# Patient Record
Sex: Female | Born: 1950 | Race: White | Hispanic: No | Marital: Married | Smoking: Never smoker
Health system: Southern US, Community
[De-identification: ages and names within clinical notes are randomized; demographics above are authoritative.]

## PROBLEM LIST (undated history)

## (undated) DIAGNOSIS — Z8719 Personal history of other diseases of the digestive system: Secondary | ICD-10-CM

## (undated) DIAGNOSIS — K219 Gastro-esophageal reflux disease without esophagitis: Secondary | ICD-10-CM

## (undated) DIAGNOSIS — C801 Malignant (primary) neoplasm, unspecified: Secondary | ICD-10-CM

## (undated) DIAGNOSIS — M1990 Unspecified osteoarthritis, unspecified site: Secondary | ICD-10-CM

## (undated) DIAGNOSIS — R42 Dizziness and giddiness: Secondary | ICD-10-CM

## (undated) DIAGNOSIS — L509 Urticaria, unspecified: Secondary | ICD-10-CM

## (undated) HISTORY — DX: Urticaria, unspecified: L50.9

## (undated) HISTORY — PX: OTHER SURGICAL HISTORY: SHX169

## (undated) HISTORY — PX: CHOLECYSTECTOMY: SHX55

## (undated) HISTORY — PX: HERNIA REPAIR: SHX51

## (undated) HISTORY — PX: TONSILLECTOMY: SUR1361

---

## 2015-09-28 ENCOUNTER — Other Ambulatory Visit: Payer: Self-pay | Admitting: Orthopedic Surgery

## 2015-10-11 ENCOUNTER — Encounter (HOSPITAL_COMMUNITY)
Admission: RE | Admit: 2015-10-11 | Discharge: 2015-10-11 | Disposition: A | Discharge status: Home / Self Care | Payer: Medicare Other | Source: Ambulatory Visit | Attending: Orthopedic Surgery | Admitting: Orthopedic Surgery

## 2015-10-11 ENCOUNTER — Ambulatory Visit (HOSPITAL_COMMUNITY)
Admission: RE | Admit: 2015-10-11 | Discharge: 2015-10-11 | Disposition: A | Discharge status: Home / Self Care | Payer: Medicare Other | Source: Ambulatory Visit | Attending: Orthopedic Surgery | Admitting: Orthopedic Surgery

## 2015-10-11 ENCOUNTER — Encounter (HOSPITAL_COMMUNITY): Payer: Self-pay

## 2015-10-11 DIAGNOSIS — Z01812 Encounter for preprocedural laboratory examination: Secondary | ICD-10-CM | POA: Insufficient documentation

## 2015-10-11 DIAGNOSIS — R9431 Abnormal electrocardiogram [ECG] [EKG]: Secondary | ICD-10-CM | POA: Insufficient documentation

## 2015-10-11 DIAGNOSIS — Z01810 Encounter for preprocedural cardiovascular examination: Secondary | ICD-10-CM

## 2015-10-11 DIAGNOSIS — Z01818 Encounter for other preprocedural examination: Secondary | ICD-10-CM

## 2015-10-11 HISTORY — DX: Unspecified osteoarthritis, unspecified site: M19.90

## 2015-10-11 HISTORY — DX: Malignant (primary) neoplasm, unspecified: C80.1

## 2015-10-11 HISTORY — DX: Gastro-esophageal reflux disease without esophagitis: K21.9

## 2015-10-11 HISTORY — DX: Dizziness and giddiness: R42

## 2015-10-11 HISTORY — DX: Personal history of other diseases of the digestive system: Z87.19

## 2015-10-11 LAB — URINALYSIS, ROUTINE W REFLEX MICROSCOPIC
BILIRUBIN URINE: NEGATIVE
GLUCOSE, UA: NEGATIVE mg/dL
Hgb urine dipstick: NEGATIVE
KETONES UR: NEGATIVE mg/dL
NITRITE: NEGATIVE
PH: 5.5 (ref 5.0–8.0)
Protein, ur: NEGATIVE mg/dL
Specific Gravity, Urine: 1.021 (ref 1.005–1.030)

## 2015-10-11 LAB — COMPREHENSIVE METABOLIC PANEL
ALT: 27 U/L (ref 14–54)
AST: 24 U/L (ref 15–41)
Albumin: 4.2 g/dL (ref 3.5–5.0)
Alkaline Phosphatase: 75 U/L (ref 38–126)
Anion gap: 8 (ref 5–15)
BILIRUBIN TOTAL: 0.8 mg/dL (ref 0.3–1.2)
BUN: 15 mg/dL (ref 6–20)
CO2: 26 mmol/L (ref 22–32)
Calcium: 10 mg/dL (ref 8.9–10.3)
Chloride: 107 mmol/L (ref 101–111)
Creatinine, Ser: 0.73 mg/dL (ref 0.44–1.00)
Glucose, Bld: 99 mg/dL (ref 65–99)
POTASSIUM: 3.5 mmol/L (ref 3.5–5.1)
Sodium: 141 mmol/L (ref 135–145)
TOTAL PROTEIN: 7.1 g/dL (ref 6.5–8.1)

## 2015-10-11 LAB — URINE MICROSCOPIC-ADD ON

## 2015-10-11 LAB — CBC WITH DIFFERENTIAL/PLATELET
BASOS PCT: 1 %
Basophils Absolute: 0.1 10*3/uL (ref 0.0–0.1)
EOS PCT: 7 %
Eosinophils Absolute: 0.9 10*3/uL — ABNORMAL HIGH (ref 0.0–0.7)
HEMATOCRIT: 43.3 % (ref 36.0–46.0)
Hemoglobin: 14.1 g/dL (ref 12.0–15.0)
LYMPHS ABS: 2.9 10*3/uL (ref 0.7–4.0)
Lymphocytes Relative: 23 %
MCH: 28 pg (ref 26.0–34.0)
MCHC: 32.6 g/dL (ref 30.0–36.0)
MCV: 86.1 fL (ref 78.0–100.0)
MONO ABS: 3.3 10*3/uL — AB (ref 0.1–1.0)
MONOS PCT: 26 %
NEUTROS ABS: 5.3 10*3/uL (ref 1.7–7.7)
Neutrophils Relative %: 43 %
Platelets: 256 10*3/uL (ref 150–400)
RBC: 5.03 MIL/uL (ref 3.87–5.11)
RDW: 14.7 % (ref 11.5–15.5)
WBC: 12.5 10*3/uL — ABNORMAL HIGH (ref 4.0–10.5)

## 2015-10-11 LAB — PROTIME-INR
INR: 1.07 (ref 0.00–1.49)
PROTHROMBIN TIME: 14.1 s (ref 11.6–15.2)

## 2015-10-11 LAB — SURGICAL PCR SCREEN
MRSA, PCR: NEGATIVE
Staphylococcus aureus: NEGATIVE

## 2015-10-11 LAB — APTT: aPTT: 25 seconds (ref 24–37)

## 2015-10-11 NOTE — Pre-Procedure Instructions (Signed)
    Laurabel Lamoureux  10/11/2015     No Pharmacies Listed   Your procedure is scheduled on Thursday, Dec. 15th   Report to [facility] Admitting at 5:30 AM   Call this number if you have problems the morning of surgery:  [phone]   Remember:  Do not eat food or drink liquids after midnight Wednesday.   Take these medicines the morning of surgery with A SIP OF WATER :   Do not wear jewelry, make-up or nail polish.  Do not wear lotions, powders, or perfumes.  You may NOT wear deodorant the day of surgery.  Do not shave underarms & legs 48 hours prior to surgery.     Do not bring valuables to the hospital.  [facility] is not responsible for any belongings or valuables.  Contacts, dentures or bridgework may not be worn into surgery.  Leave your suitcase in the car.  After surgery it may be brought to your room. For patients admitted to the hospital, discharge time will be determined by your treatment team.  Name and phone number of your driver:     Please read over the following fact sheets that you were given. Pain Booklet, Coughing and Deep Breathing, MRSA Information and Surgical Site Infection Prevention

## 2015-10-11 NOTE — H&P (Signed)
     PREOPERATIVE H&P  Chief Complaint: R arm pain  HPI: Kathleen Duran is a 64 y.o. 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 No past surgical history on file. Social History   Social History  . Marital Status: Married    Spouse Name: N/A  . Number of Children: N/A  . Years of Education: N/A   Social History Main Topics  . Smoking status: Not on file  . Smokeless tobacco: Not on file  . Alcohol Use: Not on file  . Drug Use: Not on file  . Sexual Activity: Not on file   Other Topics Concern  . Not on file   Social History Narrative  . No narrative on file   No family history on file. Allergies not on file Prior to Admission medications   Not on File     All other systems have been reviewed and were otherwise negative with the exception of those mentioned in the HPI and as above.  Physical Exam: There were no vitals filed for this visit.  General: Alert, no acute distress Cardiovascular: No pedal edema Respiratory: No cyanosis, no use of accessory musculature Skin: No lesions in the area of chief complaint Neurologic: Sensation intact distally Psychiatric: Patient is competent for consent with normal mood and affect Lymphatic: No axillary or cervical lymphadenopathy  MUSCULOSKELETAL: + spurling's on the right  Assessment/Plan: Radiculopathy Plan for Procedure(s): ANTERIOR CERVICAL DECOMPRESSION/DISCECTOMY FUSION 1 LEVEL (C4/5)   Sinclair Ship, MD 10/11/2015 8:42 AM

## 2015-10-11 NOTE — Progress Notes (Addendum)
Patient currently lives in [place], [place], works in [place].  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 Denies any heart issues, and has never seen a cardio. She see Dr. Montine Circle (who is nephrologist) as her PCP, who is also in [place]. [phone] The office has no old EKG for comparison.  Dr. Randa Spike (hematology & oncology) [phone] (@ [facility])  Have called Dr. Laurena Bering office regarding her Penicillin allergies - spoke with [facility]

## 2015-10-12 NOTE — Progress Notes (Signed)
Anesthesia Chart Review:  Pt is 64 year old female scheduled for C4-5 ACDF on 10/14/2015 with Dr. Lynann Bologna.   PMH includes:  Dysrhythmia, leukemia (1999), non-Hodgkin's lymphoma (2002), GERD. Never smoker. BMI 38.   Medications include: ASA, lipitor, irbesartan, metoprolol, Rogaine, prilosec-bicarb.   Preoperative labs reviewed.    Chest x-ray 10/11/15 reviewed. No active cardiopulmonary disease  EKG 10/11/15: NSR. LAD. Possible anterior infarction, age undetermined.  No old tracing for comparison.   Reviewed case with Dr. Tobias Alexander.   If no changes, I anticipate pt can proceed with surgery as scheduled.   Kathleen Depas, FNP-BC [facility]/Anesthesiology Phone: [phone] 10/12/2015 1:42 PM

## 2015-10-14 ENCOUNTER — Inpatient Hospital Stay (HOSPITAL_COMMUNITY)
Admission: RE | Admit: 2015-10-14 | Discharge: 2015-10-15 | DRG: 473 | Disposition: A | Discharge status: Home / Self Care | Payer: Medicare Other | Source: Ambulatory Visit | Attending: Orthopedic Surgery | Admitting: Orthopedic Surgery

## 2015-10-14 ENCOUNTER — Inpatient Hospital Stay (HOSPITAL_COMMUNITY): Payer: Medicare Other | Admitting: Emergency Medicine

## 2015-10-14 ENCOUNTER — Inpatient Hospital Stay (HOSPITAL_COMMUNITY): Payer: Medicare Other | Admitting: Anesthesiology

## 2015-10-14 ENCOUNTER — Inpatient Hospital Stay (HOSPITAL_COMMUNITY): Payer: Medicare Other

## 2015-10-14 ENCOUNTER — Encounter (HOSPITAL_COMMUNITY)
Admission: RE | Disposition: A | Discharge status: Home / Self Care | Payer: Self-pay | Source: Ambulatory Visit | Attending: Orthopedic Surgery

## 2015-10-14 ENCOUNTER — Encounter (HOSPITAL_COMMUNITY): Payer: Self-pay | Admitting: Surgery

## 2015-10-14 DIAGNOSIS — Z7982 Long term (current) use of aspirin: Secondary | ICD-10-CM | POA: Diagnosis not present

## 2015-10-14 DIAGNOSIS — Z8572 Personal history of non-Hodgkin lymphomas: Secondary | ICD-10-CM | POA: Diagnosis not present

## 2015-10-14 DIAGNOSIS — M5412 Radiculopathy, cervical region: Secondary | ICD-10-CM | POA: Diagnosis present

## 2015-10-14 DIAGNOSIS — M50121 Cervical disc disorder at C4-C5 level with radiculopathy: Principal | ICD-10-CM | POA: Diagnosis present

## 2015-10-14 DIAGNOSIS — Z419 Encounter for procedure for purposes other than remedying health state, unspecified: Secondary | ICD-10-CM

## 2015-10-14 DIAGNOSIS — M5410 Radiculopathy, site unspecified: Secondary | ICD-10-CM | POA: Diagnosis present

## 2015-10-14 HISTORY — PX: ANTERIOR CERVICAL DECOMP/DISCECTOMY FUSION: SHX1161

## 2015-10-14 SURGERY — ANTERIOR CERVICAL DECOMPRESSION/DISCECTOMY FUSION 1 LEVEL
Anesthesia: General

## 2015-10-14 MED ORDER — FENTANYL CITRATE (PF) 250 MCG/5ML IJ SOLN
INTRAMUSCULAR | Status: AC
  Filled 2015-10-14: qty 5

## 2015-10-14 MED ORDER — ROCURONIUM BROMIDE 100 MG/10ML IV SOLN
INTRAVENOUS | Status: DC | PRN
  Administered 2015-10-14: 50 mg via INTRAVENOUS

## 2015-10-14 MED ORDER — FLEET ENEMA 7-19 GM/118ML RE ENEM
1.0000 | ENEMA | Freq: Once | RECTAL | Status: DC | PRN
Start: 2015-10-14 — End: 2015-10-15

## 2015-10-14 MED ORDER — THROMBIN 20000 UNITS EX KIT
PACK | CUTANEOUS | Status: AC
  Filled 2015-10-14: qty 1

## 2015-10-14 MED ORDER — POVIDONE-IODINE 7.5 % EX SOLN
Freq: Once | CUTANEOUS | Status: DC
  Filled 2015-10-14: qty 118

## 2015-10-14 MED ORDER — ATORVASTATIN CALCIUM 20 MG PO TABS
40.0000 mg | ORAL_TABLET | Freq: Every day | ORAL | Status: DC
  Administered 2015-10-14: 40 mg via ORAL
  Filled 2015-10-14: qty 2

## 2015-10-14 MED ORDER — LIDOCAINE HCL 1 % IJ SOLN
INTRAMUSCULAR | Status: DC | PRN
  Administered 2015-10-14: 80 mg via INTRADERMAL

## 2015-10-14 MED ORDER — HYDROMORPHONE HCL 2 MG PO TABS
1.0000 mg | ORAL_TABLET | ORAL | Status: DC | PRN
  Administered 2015-10-14 – 2015-10-15 (×5): 2 mg via ORAL
  Filled 2015-10-14 (×4): qty 1

## 2015-10-14 MED ORDER — SODIUM CHLORIDE 0.9 % IV SOLN: 250.0000 mL | INTRAVENOUS | Status: DC

## 2015-10-14 MED ORDER — ARTIFICIAL TEARS OP OINT
TOPICAL_OINTMENT | OPHTHALMIC | Status: AC
  Filled 2015-10-14: qty 7

## 2015-10-14 MED ORDER — GLYCOPYRROLATE 0.2 MG/ML IJ SOLN
INTRAMUSCULAR | Status: AC
  Filled 2015-10-14: qty 6

## 2015-10-14 MED ORDER — ACETAMINOPHEN 650 MG RE SUPP: 650.0000 mg | RECTAL | Status: DC | PRN

## 2015-10-14 MED ORDER — THROMBIN 20000 UNITS EX KIT
PACK | CUTANEOUS | Status: DC | PRN
  Administered 2015-10-14: 20000 [IU] via TOPICAL

## 2015-10-14 MED ORDER — DIPHENHYDRAMINE HCL 50 MG/ML IJ SOLN
INTRAMUSCULAR | Status: DC | PRN
  Administered 2015-10-14: 12.5 mg via INTRAVENOUS

## 2015-10-14 MED ORDER — ACETAMINOPHEN 325 MG PO TABS
650.0000 mg | ORAL_TABLET | ORAL | Status: DC | PRN
  Administered 2015-10-15: 650 mg via ORAL
  Filled 2015-10-14: qty 2

## 2015-10-14 MED ORDER — BUPIVACAINE-EPINEPHRINE (PF) 0.25% -1:200000 IJ SOLN
INTRAMUSCULAR | Status: AC
  Filled 2015-10-14: qty 30

## 2015-10-14 MED ORDER — VANCOMYCIN HCL IN DEXTROSE 1-5 GM/200ML-% IV SOLN
1000.0000 mg | Freq: Once | INTRAVENOUS | Status: AC
  Administered 2015-10-14: 1000 mg via INTRAVENOUS
  Filled 2015-10-14: qty 200

## 2015-10-14 MED ORDER — ROCURONIUM BROMIDE 50 MG/5ML IV SOLN
INTRAVENOUS | Status: AC
  Filled 2015-10-14: qty 4

## 2015-10-14 MED ORDER — ONDANSETRON HCL 4 MG/2ML IJ SOLN: 4.0000 mg | INTRAMUSCULAR | Status: DC | PRN

## 2015-10-14 MED ORDER — 0.9 % SODIUM CHLORIDE (POUR BTL) OPTIME
TOPICAL | Status: DC | PRN
  Administered 2015-10-14: 1000 mL

## 2015-10-14 MED ORDER — ADULT MULTIVITAMIN W/MINERALS CH
1.0000 | ORAL_TABLET | Freq: Every day | ORAL | Status: DC
  Filled 2015-10-14: qty 1

## 2015-10-14 MED ORDER — VITAMIN C 500 MG PO TABS
1000.0000 mg | ORAL_TABLET | Freq: Every day | ORAL | Status: DC
  Filled 2015-10-14 (×2): qty 2

## 2015-10-14 MED ORDER — VANCOMYCIN HCL 1000 MG IV SOLR
1000.0000 mg | INTRAVENOUS | Status: DC | PRN
  Administered 2015-10-14: 1000 mg via INTRAVENOUS

## 2015-10-14 MED ORDER — DIAZEPAM 5 MG PO TABS
ORAL_TABLET | ORAL | Status: AC
  Administered 2015-10-14: 5 mg via ORAL
  Filled 2015-10-14: qty 1

## 2015-10-14 MED ORDER — MIDAZOLAM HCL 5 MG/5ML IJ SOLN
INTRAMUSCULAR | Status: DC | PRN
  Administered 2015-10-14: 2 mg via INTRAVENOUS

## 2015-10-14 MED ORDER — DIAZEPAM 5 MG PO TABS
5.0000 mg | ORAL_TABLET | Freq: Four times a day (QID) | ORAL | Status: DC | PRN
  Administered 2015-10-14 – 2015-10-15 (×3): 5 mg via ORAL
  Filled 2015-10-14 (×2): qty 1

## 2015-10-14 MED ORDER — VITAMIN E 180 MG (400 UNIT) PO CAPS
400.0000 [IU] | ORAL_CAPSULE | Freq: Every day | ORAL | Status: DC
  Filled 2015-10-14 (×2): qty 1

## 2015-10-14 MED ORDER — SUGAMMADEX SODIUM 200 MG/2ML IV SOLN
INTRAVENOUS | Status: DC | PRN
  Administered 2015-10-14: 200 mg via INTRAVENOUS

## 2015-10-14 MED ORDER — MENTHOL 3 MG MT LOZG: 1.0000 | LOZENGE | OROMUCOSAL | Status: DC | PRN

## 2015-10-14 MED ORDER — BUPIVACAINE-EPINEPHRINE 0.25% -1:200000 IJ SOLN
INTRAMUSCULAR | Status: DC | PRN
  Administered 2015-10-14: 10 mL

## 2015-10-14 MED ORDER — DOCUSATE SODIUM 100 MG PO CAPS
100.0000 mg | ORAL_CAPSULE | Freq: Two times a day (BID) | ORAL | Status: DC
  Administered 2015-10-14: 100 mg via ORAL
  Filled 2015-10-14 (×2): qty 1

## 2015-10-14 MED ORDER — FENTANYL CITRATE (PF) 250 MCG/5ML IJ SOLN
INTRAMUSCULAR | Status: DC | PRN
  Administered 2015-10-14 (×5): 50 ug via INTRAVENOUS

## 2015-10-14 MED ORDER — SODIUM CHLORIDE 0.9 % IJ SOLN
3.0000 mL | Freq: Two times a day (BID) | INTRAMUSCULAR | Status: DC
  Administered 2015-10-14: 3 mL via INTRAVENOUS

## 2015-10-14 MED ORDER — STERILE WATER FOR INJECTION IJ SOLN
INTRAMUSCULAR | Status: AC
  Filled 2015-10-14: qty 10

## 2015-10-14 MED ORDER — LIDOCAINE HCL (CARDIAC) 20 MG/ML IV SOLN
INTRAVENOUS | Status: AC
  Filled 2015-10-14: qty 15

## 2015-10-14 MED ORDER — PHENOL 1.4 % MT LIQD: 1.0000 | OROMUCOSAL | Status: DC | PRN

## 2015-10-14 MED ORDER — SODIUM CHLORIDE 0.9 % IJ SOLN: 3.0000 mL | INTRAMUSCULAR | Status: DC | PRN

## 2015-10-14 MED ORDER — HYDROMORPHONE HCL 1 MG/ML IJ SOLN
0.2500 mg | INTRAMUSCULAR | Status: DC | PRN
  Administered 2015-10-14 (×2): 0.5 mg via INTRAVENOUS

## 2015-10-14 MED ORDER — METOPROLOL TARTRATE 25 MG PO TABS: 50.0000 mg | ORAL_TABLET | Freq: Every day | ORAL | Status: DC

## 2015-10-14 MED ORDER — SUCCINYLCHOLINE CHLORIDE 20 MG/ML IJ SOLN
INTRAMUSCULAR | Status: AC
  Filled 2015-10-14: qty 2

## 2015-10-14 MED ORDER — PROPOFOL 10 MG/ML IV BOLUS
INTRAVENOUS | Status: AC
  Filled 2015-10-14: qty 40

## 2015-10-14 MED ORDER — NEOSTIGMINE METHYLSULFATE 10 MG/10ML IV SOLN
INTRAVENOUS | Status: AC
  Filled 2015-10-14: qty 2

## 2015-10-14 MED ORDER — METOPROLOL TARTRATE 50 MG PO TABS
50.0000 mg | ORAL_TABLET | Freq: Once | ORAL | Status: AC
  Administered 2015-10-14: 50 mg via ORAL
  Filled 2015-10-14 (×2): qty 1

## 2015-10-14 MED ORDER — SODIUM CHLORIDE 0.9 % IJ SOLN
INTRAMUSCULAR | Status: AC
  Filled 2015-10-14: qty 10

## 2015-10-14 MED ORDER — SUGAMMADEX SODIUM 200 MG/2ML IV SOLN
INTRAVENOUS | Status: AC
  Filled 2015-10-14: qty 2

## 2015-10-14 MED ORDER — ONDANSETRON HCL 4 MG/2ML IJ SOLN
INTRAMUSCULAR | Status: DC | PRN
  Administered 2015-10-14: 4 mg via INTRAVENOUS

## 2015-10-14 MED ORDER — IRBESARTAN 300 MG PO TABS
300.0000 mg | ORAL_TABLET | Freq: Every day | ORAL | Status: DC
  Administered 2015-10-14: 300 mg via ORAL
  Filled 2015-10-14 (×2): qty 1

## 2015-10-14 MED ORDER — PANTOPRAZOLE SODIUM 40 MG PO TBEC
80.0000 mg | DELAYED_RELEASE_TABLET | Freq: Every day | ORAL | Status: DC
  Filled 2015-10-14: qty 2

## 2015-10-14 MED ORDER — HYDROMORPHONE HCL 1 MG/ML IJ SOLN: 0.5000 mg | INTRAMUSCULAR | Status: DC | PRN

## 2015-10-14 MED ORDER — HYDROMORPHONE HCL 1 MG/ML IJ SOLN
INTRAMUSCULAR | Status: AC
  Administered 2015-10-14: 0.5 mg via INTRAVENOUS
  Filled 2015-10-14: qty 1

## 2015-10-14 MED ORDER — LACTATED RINGERS IV SOLN
INTRAVENOUS | Status: DC | PRN
  Administered 2015-10-14 (×2): via INTRAVENOUS

## 2015-10-14 MED ORDER — PHENYLEPHRINE 40 MCG/ML (10ML) SYRINGE FOR IV PUSH (FOR BLOOD PRESSURE SUPPORT)
PREFILLED_SYRINGE | INTRAVENOUS | Status: AC
  Filled 2015-10-14: qty 20

## 2015-10-14 MED ORDER — MIDAZOLAM HCL 2 MG/2ML IJ SOLN
INTRAMUSCULAR | Status: AC
  Filled 2015-10-14: qty 2

## 2015-10-14 MED ORDER — VANCOMYCIN HCL IN DEXTROSE 1-5 GM/200ML-% IV SOLN
1000.0000 mg | INTRAVENOUS | Status: DC
  Filled 2015-10-14: qty 200

## 2015-10-14 MED ORDER — BISACODYL 5 MG PO TBEC: 5.0000 mg | DELAYED_RELEASE_TABLET | Freq: Every day | ORAL | Status: DC | PRN

## 2015-10-14 MED ORDER — SENNOSIDES-DOCUSATE SODIUM 8.6-50 MG PO TABS: 1.0000 | ORAL_TABLET | Freq: Every evening | ORAL | Status: DC | PRN

## 2015-10-14 MED ORDER — ZOLPIDEM TARTRATE 5 MG PO TABS: 5.0000 mg | ORAL_TABLET | Freq: Every evening | ORAL | Status: DC | PRN

## 2015-10-14 MED ORDER — DEXAMETHASONE SODIUM PHOSPHATE 4 MG/ML IJ SOLN
INTRAMUSCULAR | Status: DC | PRN
  Administered 2015-10-14: 4 mg via INTRAVENOUS

## 2015-10-14 MED ORDER — HYDROMORPHONE HCL 2 MG PO TABS
ORAL_TABLET | ORAL | Status: AC
  Administered 2015-10-14: 2 mg via ORAL
  Filled 2015-10-14: qty 2

## 2015-10-14 MED ORDER — EPHEDRINE SULFATE 50 MG/ML IJ SOLN
INTRAMUSCULAR | Status: AC
  Filled 2015-10-14: qty 2

## 2015-10-14 MED ORDER — ALUM & MAG HYDROXIDE-SIMETH 200-200-20 MG/5ML PO SUSP: 30.0000 mL | Freq: Four times a day (QID) | ORAL | Status: DC | PRN

## 2015-10-14 MED ORDER — PROPOFOL 10 MG/ML IV BOLUS
INTRAVENOUS | Status: DC | PRN
  Administered 2015-10-14: 160 mg via INTRAVENOUS

## 2015-10-14 MED ORDER — PHENYLEPHRINE HCL 10 MG/ML IJ SOLN
INTRAMUSCULAR | Status: DC | PRN
  Administered 2015-10-14: 80 ug via INTRAVENOUS

## 2015-10-14 MED ORDER — ONDANSETRON HCL 4 MG/2ML IJ SOLN
INTRAMUSCULAR | Status: AC
  Filled 2015-10-14: qty 6

## 2015-10-14 SURGICAL SUPPLY — 70 items
BENZOIN TINCTURE PRP APPL 2/3 (GAUZE/BANDAGES/DRESSINGS) ×2 IMPLANT
BIT DRILL NEURO 2X3.1 SFT TUCH (MISCELLANEOUS) ×1 IMPLANT
BIT DRILL SRG 14X2.2XFLT CHK (BIT) ×1 IMPLANT
BIT DRL SRG 14X2.2XFLT CHK (BIT) ×1
BLADE SURG 15 STRL LF DISP TIS (BLADE) ×1 IMPLANT
BLADE SURG 15 STRL SS (BLADE) ×1
BLADE SURG ROTATE 9660 (MISCELLANEOUS) ×2 IMPLANT
BUR MATCHSTICK NEURO 3.0 LAGG (BURR) IMPLANT
CARTRIDGE OIL MAESTRO DRILL (MISCELLANEOUS) ×1 IMPLANT
CORDS BIPOLAR (ELECTRODE) ×2 IMPLANT
COVER SURGICAL LIGHT HANDLE (MISCELLANEOUS) ×2 IMPLANT
CRADLE DONUT ADULT HEAD (MISCELLANEOUS) ×2 IMPLANT
DIFFUSER DRILL AIR PNEUMATIC (MISCELLANEOUS) ×2 IMPLANT
DRAIN JACKSON RD 7FR 3/32 (WOUND CARE) IMPLANT
DRAPE C-ARM 42X72 X-RAY (DRAPES) ×2 IMPLANT
DRAPE POUCH INSTRU U-SHP 10X18 (DRAPES) ×2 IMPLANT
DRAPE SURG 17X23 STRL (DRAPES) ×6 IMPLANT
DRILL BIT SKYLINE 14MM (BIT) ×1
DRILL NEURO 2X3.1 SOFT TOUCH (MISCELLANEOUS) ×2
DURAPREP 26ML APPLICATOR (WOUND CARE) ×2 IMPLANT
ELECT COATED BLADE 2.86 ST (ELECTRODE) ×2 IMPLANT
ELECT REM PT RETURN 9FT ADLT (ELECTROSURGICAL) ×2
ELECTRODE REM PT RTRN 9FT ADLT (ELECTROSURGICAL) ×1 IMPLANT
EVACUATOR SILICONE 100CC (DRAIN) IMPLANT
GAUZE SPONGE 4X4 12PLY STRL (GAUZE/BANDAGES/DRESSINGS) ×2 IMPLANT
GAUZE SPONGE 4X4 16PLY XRAY LF (GAUZE/BANDAGES/DRESSINGS) ×2 IMPLANT
GLOVE BIO SURGEON STRL SZ7 (GLOVE) ×2 IMPLANT
GLOVE BIO SURGEON STRL SZ8 (GLOVE) ×2 IMPLANT
GLOVE BIOGEL PI IND STRL 7.0 (GLOVE) ×2 IMPLANT
GLOVE BIOGEL PI IND STRL 8 (GLOVE) ×1 IMPLANT
GLOVE BIOGEL PI INDICATOR 7.0 (GLOVE) ×2
GLOVE BIOGEL PI INDICATOR 8 (GLOVE) ×1
GOWN STRL REUS W/ TWL LRG LVL3 (GOWN DISPOSABLE) ×1 IMPLANT
GOWN STRL REUS W/ TWL XL LVL3 (GOWN DISPOSABLE) ×1 IMPLANT
GOWN STRL REUS W/TWL LRG LVL3 (GOWN DISPOSABLE) ×1
GOWN STRL REUS W/TWL XL LVL3 (GOWN DISPOSABLE) ×1
INTERLOCK LRDTC CRVCL VBR 7MM (Bone Implant) ×1 IMPLANT
IV CATH 14GX2 1/4 (CATHETERS) ×2 IMPLANT
KIT BASIN OR (CUSTOM PROCEDURE TRAY) ×2 IMPLANT
KIT ROOM TURNOVER OR (KITS) ×2 IMPLANT
LORDOTIC CERVICAL VBR 7MM SM (Bone Implant) ×2 IMPLANT
MANIFOLD NEPTUNE II (INSTRUMENTS) ×2 IMPLANT
NEEDLE 27GAX1X1/2 (NEEDLE) ×2 IMPLANT
NEEDLE SPNL 20GX3.5 QUINCKE YW (NEEDLE) ×2 IMPLANT
NS IRRIG 1000ML POUR BTL (IV SOLUTION) ×2 IMPLANT
OIL CARTRIDGE MAESTRO DRILL (MISCELLANEOUS) ×2
PACK ORTHO CERVICAL (CUSTOM PROCEDURE TRAY) ×2 IMPLANT
PAD ARMBOARD 7.5X6 YLW CONV (MISCELLANEOUS) ×4 IMPLANT
PATTIES SURGICAL .5 X.5 (GAUZE/BANDAGES/DRESSINGS) ×2 IMPLANT
PATTIES SURGICAL .5 X1 (DISPOSABLE) ×2 IMPLANT
PIN DISTRACTION 14 (PIN) ×4 IMPLANT
PLATE SKYLINE 12MM (Plate) ×2 IMPLANT
PUTTY BONE DBX 2.5 MIS (Bone Implant) ×2 IMPLANT
SCREW VAR SELF TAP SKYLINE 14M (Screw) ×8 IMPLANT
SPONGE INTESTINAL PEANUT (DISPOSABLE) ×2 IMPLANT
SPONGE SURGIFOAM ABS GEL 100 (HEMOSTASIS) ×2 IMPLANT
STRIP CLOSURE SKIN 1/2X4 (GAUZE/BANDAGES/DRESSINGS) ×2 IMPLANT
SURGIFLO W/THROMBIN 8M KIT (HEMOSTASIS) IMPLANT
SUT MNCRL AB 4-0 PS2 18 (SUTURE) ×2 IMPLANT
SUT SILK 4 0 (SUTURE)
SUT SILK 4-0 18XBRD TIE 12 (SUTURE) IMPLANT
SUT VIC AB 2-0 CT2 18 VCP726D (SUTURE) ×2 IMPLANT
SYR BULB IRRIGATION 50ML (SYRINGE) ×2 IMPLANT
SYR CONTROL 10ML LL (SYRINGE) ×4 IMPLANT
TAPE CLOTH 4X10 WHT NS (GAUZE/BANDAGES/DRESSINGS) ×2 IMPLANT
TAPE UMBILICAL COTTON 1/8X30 (MISCELLANEOUS) ×2 IMPLANT
TOWEL OR 17X24 6PK STRL BLUE (TOWEL DISPOSABLE) ×2 IMPLANT
TOWEL OR 17X26 10 PK STRL BLUE (TOWEL DISPOSABLE) ×2 IMPLANT
WATER STERILE IRR 1000ML POUR (IV SOLUTION) ×2 IMPLANT
YANKAUER SUCT BULB TIP NO VENT (SUCTIONS) ×2 IMPLANT

## 2015-10-14 NOTE — Transfer of Care (Signed)
Immediate Anesthesia Transfer of Care Note  Patient: Kathleen Duran  Procedure(s) Performed: Procedure(s) with comments: ANTERIOR CERVICAL DECOMPRESSION/DISCECTOMY FUSION 1 LEVEL (N/A) - Anterior cervical decompression fusion, cerivcal 4-5 with instrumentation and allograft  Patient Location: PACU  Anesthesia Type:General  Level of Consciousness: sedated  Airway & Oxygen Therapy: Patient Spontanous Breathing and Patient connected to face mask oxygen  Post-op Assessment: Report given to RN and Post -op Vital signs reviewed and stable  Post vital signs: Reviewed and stable  Last Vitals:  Filed Vitals:   10/14/15 0628  BP: 174/73  Pulse: 90  Temp: 36.5 C  Resp: 20    Complications: No apparent anesthesia complications

## 2015-10-14 NOTE — Progress Notes (Signed)
Utilization review completed.  

## 2015-10-14 NOTE — Op Note (Signed)
NAMEMarland Kitchen  Duran, Kathleen Duran NO.:  [id number]  MEDICAL RECORD NO.:  [id number]  LOCATION:  3C11C                        FACILITY:  [facility]  PHYSICIAN:  Phylliss Bob, MD      DATE OF BIRTH:  1951-09-05  DATE OF PROCEDURE:  10/14/2015                              OPERATIVE REPORT   PREOPERATIVE DIAGNOSES: 1. Right-sided C5 radiculopathy. 2. 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  POSTOPERATIVE DIAGNOSES: 1. Right-sided C5 radiculopathy. 2. 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  PROCEDURE: 1. Anterior cervical decompression and fusion C4-5. 2. Displacement of anterior instrumentation, C4-5. 3. Use of morselized allograft-DBX mix. 4. Insertion of interbody device x1 (Titan intervertebral spacer, 7     mm, small, lordotic). 5. Intraoperative use of fluoroscopy.  SURGEON:  Phylliss Bob, MD  ASSISTANT:  Pricilla Holm, PA-C  ANESTHESIA:  General endotracheal anesthesia.  COMPLICATIONS:  None.  DISPOSITION:  Stable.  ESTIMATED BLOOD LOSS:  Minimal.  INDICATIONS FOR SURGERY:  Briefly, Ms. Round is a very pleasant 64- year-old female, who did present to me with severe pain in the right arm.  An MRI did reveal severe right-sided neuroforaminal compression at C4-5, severely compressing the right C5 nerve.  On review of the remainder of the MRI, there was no additional nerve compression noted throughout the cervical spine.  Given the patient's ongoing pain, despite appropriate conservative treatment measures, the patient did elect to proceed with surgical intervention.  The patient was fully aware of the risks and limitations of the procedure.  OPERATIVE DETAILS:  On October 14, 2015, the patient was brought to surgery and general endotracheal anesthesia was administered.  The patient was placed supine on the hospital bed.  The neck was gently extended.  The patient's arms  were secured to her sides.  All bony prominences were meticulously padded.  The neck was prepped and draped and a time-out was performed.  A left-sided transverse incision was then made in line with the C4-5 intervertebral space.  The platysma was incised and a Smith-Robinson approach was used to approach the anterior spine.  The C4-5 space was noted.  The vertebral bodies of C4 and C5 were subperiosteally exposed.  A self-retaining retractor was placed. Caspar pins were placed into the C4 and C5 vertebral bodies and distraction was applied.  I then used a 15 blade knife to perform an annulotomy anteriorly.  A thorough complete C4-5 intervertebral diskectomy was performed.  The posterior longitudinal ligament was identified and entered using a nerve hook.  I then used a #1 followed by #2 Kerrison to perform a thorough and complete bilateral neuroforaminal decompression.  Of note, there was a prominent protrusion into the right neural foramen, severely compressing the right C5 nerve.  This compression was thoroughly removed.  The endplates were then prepared and the appropriate size interbody spacer was packed with DBX mix and tamped into position in the usual fashion.  I was very pleased with the press-fit of the implant.  I was very pleased with the AP and lateral fluoroscopic images.  A 12 mm plate was then placed over the anterior spine.  A 14 mm variable angle screws were placed, 2 in each vertebral body at C4 and C5 for a total of 4 screws.  The screws were then locked into the plate using the CAM locking mechanism.  The wound was then copiously irrigated.  I was very pleased with the final AP and lateral fluoroscopic images.  The wound was then closed in layers using 2-0 Vicryl followed by 3-0 Monocryl.  Benzoin and Steri-Strips were applied followed by sterile dressing.  All instrument counts were correct at the termination of the procedure.  Of note, Pricilla Holm was my  assistant throughout surgery, and did aid in retraction, suctioning, and closure.     Phylliss Bob, MD     MD/MEDQ  D:  10/14/2015  T:  10/14/2015  Job:  [id number]  cc:   Dr. Montine Circle

## 2015-10-14 NOTE — Progress Notes (Signed)
ANTIBIOTIC CONSULT NOTE - INITIAL  Pharmacy Consult for Vancomycin Indication: Post-op prophylaxis x 1 dose  Allergies  Allergen Reactions  . Cephalosporins Other (See Comments)    ALLERGY to PCN with immediate rash, facial/tongue/throat swelling, SOB, lightheadedness with hypotension.  . Contrast Media [Iodinated Diagnostic Agents] Hives and Shortness Of Breath  . Penicillins Hives    Has patient had a PCN reaction causing immediate rash, facial/tongue/throat swelling, SOB or lightheadedness with hypotension: Yes Has patient had a PCN reaction causing severe rash involving mucus membranes or skin necrosis: No Has patient had a PCN reaction that required hospitalization No Has patient had a PCN reaction occurring within the last 10 years: No If all of the above answers are "NO", then may proceed with Cephalosporin use.  Marland Kitchen Amoxicillin Hives  . Codeine Nausea And Vomiting  . Keflex [Cephalexin] Hives    Patient Measurements: Height: 5' 2.5" (158.8 cm) Weight: 210 lb (95.255 kg) IBW/kg (Calculated) : 51.25 Adjusted Body Weight:   Vital Signs: Temp: 98.4 F (36.9 C) (12/15 1315) Temp Source: Oral (12/15 0628) BP: 161/78 mmHg (12/15 1315) Pulse Rate: 88 (12/15 1315) Intake/Output from previous day:   Intake/Output from this shift: Total I/O In: 1750 [I.V.:1500; IV Piggyback:250] Out: 50 [Blood:50]  Labs: No results for input(s): WBC, HGB, PLT, LABCREA, CREATININE in the last 72 hours. Estimated Creatinine Clearance: 76.3 mL/min (by C-G formula based on Cr of 0.73). No results for input(s): VANCOTROUGH, VANCOPEAK, VANCORANDOM, GENTTROUGH, GENTPEAK, GENTRANDOM, TOBRATROUGH, TOBRAPEAK, TOBRARND, AMIKACINPEAK, AMIKACINTROU, AMIKACIN in the last 72 hours.   Microbiology: Recent Results (from the past 720 hour(s))  Surgical pcr screen     Status: None   Collection Time: 10/11/15 12:37 PM  Result Value Ref Range Status   MRSA, PCR NEGATIVE NEGATIVE Final   Staphylococcus aureus  NEGATIVE NEGATIVE Final    Comment:        The Xpert SA Assay (FDA approved for NASAL specimens in patients over 38 years of age), is one component of a comprehensive surveillance program.  Test performance has been validated by [facility] for patients greater than or equal to 53 year old. It is not intended to diagnose infection nor to guide or monitor treatment.     Medical History: Past Medical History  Diagnosis Date  . Dysrhythmia   . History of hiatal hernia   . GERD (gastroesophageal reflux disease)     borderline bleeding ulcer   . Vertigo   . Arthritis   . Cancer [facility])     b cell leukemia  1999 , non hodkins lymphoma 2002    Medications:  Scheduled:  . atorvastatin  40 mg Oral QHS  . docusate sodium  100 mg Oral BID  . irbesartan  300 mg Oral QHS  . [START ON 10/15/2015] metoprolol  50 mg Oral Daily  . multivitamin with minerals  1 tablet Oral Daily  . [START ON 10/15/2015] pantoprazole  80 mg Oral Daily  . sodium chloride  3 mL Intravenous Q12H  . vitamin C  1,000 mg Oral Daily  . vitamin E  400 Units Oral Daily   Assessment: 64yo female s/p anterior cervical decompression/discectomy fusiol 1 level this AM, to receive Vancomycin x 1 dose for post-op prophylaxis.  She received Vancomycin 1000mg  IV x 1 around 8AM pre-op.  Pt does not have a drain.  Pre-op labs (12/12):  Cr 0.73, CrCl ~65  Lytes wnl  WBC 12.5   Goal of Therapy:  Vancomycin trough level 10-15 mcg/ml  Plan:  Repeat Vancomycin 1000mg  at 8PM tonight.  Gracy Bruins, PharmD Clinical Pharmacist [facility]

## 2015-10-14 NOTE — Anesthesia Preprocedure Evaluation (Addendum)
Anesthesia Evaluation  Patient identified by MRN, date of birth, ID band Patient awake    Reviewed: Allergy & Precautions, H&P , NPO status , Patient's Chart, lab work & pertinent test results  Airway Mallampati: II  TM Distance: >3 FB Neck ROM: Full    Dental no notable dental hx. (+) Teeth Intact, Dental Advisory Given   Pulmonary neg pulmonary ROS,    Pulmonary exam normal breath sounds clear to auscultation       Cardiovascular + dysrhythmias  Rhythm:Regular Rate:Normal     Neuro/Psych negative neurological ROS  negative psych ROS   GI/Hepatic Neg liver ROS, GERD  Medicated and Controlled,  Endo/Other  Morbid obesity  Renal/GU negative Renal ROS  negative genitourinary   Musculoskeletal  (+) Arthritis , Osteoarthritis,    Abdominal   Peds  Hematology negative hematology ROS (+)   Anesthesia Other Findings   Reproductive/Obstetrics negative OB ROS                            Anesthesia Physical Anesthesia Plan  ASA: III  Anesthesia Plan: General   Post-op Pain Management:    Induction: Intravenous  Airway Management Planned: Oral ETT  Additional Equipment:   Intra-op Plan:   Post-operative Plan: Extubation in OR  Informed Consent: I have reviewed the patients History and Physical, chart, labs and discussed the procedure including the risks, benefits and alternatives for the proposed anesthesia with the patient or authorized representative who has indicated his/her understanding and acceptance.   Dental advisory given  Plan Discussed with: CRNA and Anesthesiologist  Anesthesia Plan Comments:        Anesthesia Quick Evaluation

## 2015-10-14 NOTE — Anesthesia Postprocedure Evaluation (Signed)
Anesthesia Post Note  Patient: Kathleen Duran  Procedure(s) Performed: Procedure(s) (LRB): ANTERIOR CERVICAL DECOMPRESSION/DISCECTOMY FUSION 1 LEVEL (N/A)  Patient location during evaluation: PACU Anesthesia Type: General Level of consciousness: awake and alert Pain management: pain level controlled Vital Signs Assessment: post-procedure vital signs reviewed and stable Respiratory status: spontaneous breathing, nonlabored ventilation and respiratory function stable Cardiovascular status: blood pressure returned to baseline and stable Postop Assessment: no signs of nausea or vomiting Anesthetic complications: no    Last Vitals:  Filed Vitals:   10/14/15 1000 10/14/15 1012  BP: 144/70   Pulse: 77 75  Temp:  36.8 C  Resp: 15 18    Last Pain:  Filed Vitals:   10/14/15 1035  PainSc: 3                  Shaquia Berkley,W. EDMOND

## 2015-10-14 NOTE — Anesthesia Procedure Notes (Signed)
Procedure Name: Intubation Date/Time: 10/14/2015 7:58 AM Performed by: Maude Leriche D Pre-anesthesia Checklist: Patient identified, Emergency Drugs available, Suction available, Patient being monitored and Timeout performed Patient Re-evaluated:Patient Re-evaluated prior to inductionOxygen Delivery Method: Circle system utilized Preoxygenation: Pre-oxygenation with 100% oxygen Intubation Type: IV induction Ventilation: Mask ventilation without difficulty Laryngoscope Size: Miller and 2 Grade View: Grade III Tube type: Oral Tube size: 7.5 mm Number of attempts: 2 (attempt by CRNA with grade 4 view (secondary to stiff neck) and no attempt at placing ETT. DL x1 by MDA with grade 3 view and successful placement of ETT) Airway Equipment and Method: Stylet Placement Confirmation: ETT inserted through vocal cords under direct vision,  positive ETCO2 and breath sounds checked- equal and bilateral Secured at: 22 cm Tube secured with: Tape Dental Injury: Teeth and Oropharynx as per pre-operative assessment

## 2015-10-15 ENCOUNTER — Encounter (HOSPITAL_COMMUNITY): Payer: Self-pay | Admitting: Orthopedic Surgery

## 2015-10-15 MED ORDER — HYDROMORPHONE HCL 2 MG PO TABS: 1.0000 mg | ORAL_TABLET | ORAL | Status: DC | PRN

## 2015-10-15 NOTE — Discharge Instructions (Signed)
Anterior Cervical Diskectomy and Fusion °Anterior cervical diskectomy and fusion is a surgery that is done on the neck (cervical spine) to take pressure off of the nerves or the spinal cord. It is performed through the front (anterior) part of the neck. During this surgery, the damaged disk that is causing pain, numbness, or weakness is removed. The area where the disk was removed is filled with a plastic spacer implant, a bone graft, or both. These implants and bone grafts take pressure off of the nerves and spinal cord by making more room for the nerves to leave the spine. °LET YOUR HEALTH CARE PROVIDER KNOW ABOUT: °· Any allergies you have. °· All medicines you are taking, including vitamins, herbs, eye drops, creams, and over-the-counter medicines. °· Previous problems you or members of your family have had with the use of anesthetics. °· Any blood disorders you have. °· Previous surgeries you have had. °· Any medical conditions you may have. °RISKS AND COMPLICATIONS °Generally, this is a safe procedure. However, problems may occur, including: °· Infection. °· Bleeding with the possible need for blood transfusion. °· Injury to surrounding structures, including nerves. °· Leakage of fluid from the brain or spinal cord (cerebrospinal fluid). °· Blood clots. °· Temporary breathing difficulties after surgery. °BEFORE THE PROCEDURE °· Follow your health care provider's instructions about eating or drinking restrictions. °· Ask your health care provider about: °¨ Changing or stopping your regular medicines. This is especially important if you are taking diabetes medicines or blood thinners. °¨ Taking medicines such as aspirin and ibuprofen. These medicines can thin your blood. Do not take these medicines before your procedure if your health care provider instructs you not to. °· You may be given antibiotic medicines to help prevent infection. °· Your incision site may be marked on your neck. °PROCEDURE °· An IV tube  will be inserted into one of your veins. °· You will be given one or more of the following: °¨ A medicine that helps you relax (sedative). °¨ A medicine that makes you fall asleep (general anesthetic). °· A breathing tube will be placed. °· Your neck will be cleaned with a germ-killing solution (antiseptic). °· Your surgeon will make an incision on the front of your neck, usually within a skinfold line. °· Your neck muscles will be spread apart, and the damaged disk and bone spurs will be removed. °· The area where the disk was removed will be filled with a small plastic spacer implant, a bone graft, or both. °· Your surgeon may put metal plates and screws (hardware) in your neck. This helps to stabilize the surgical site and keep implants and bone grafts in place. The hardware reduces motion at the surgical site so your bones can grow together (fuse). This provides extra support to your neck. °· The incision will be closed with stitches (sutures). °· A bandage (dressing) will be applied to cover the incision. °The procedure may vary among health care providers and hospitals. °AFTER THE PROCEDURE °· Your blood pressure, heart rate, breathing rate, and blood oxygen level will be monitored often until the medicines you were given have worn off.  °· You will be monitored for any signs of complications from the procedure, such as: °¨ Too much bleeding from the incision site. °¨ A buildup of blood under your skin at the surgical site. °¨ Difficulty breathing. °· You may continue to receive antibiotics. °· You can start to eat as soon as you feel comfortable. °· You may be given   a neck brace to wear after surgery. This brace limits your neck movement while your bones are fusing together. Follow your health care provider's instructions about how often and how long you need to wear this. °  °This information is not intended to replace advice given to you by your health care provider. Make sure you discuss any questions you  have with your health care provider. °  °Document Released: 10/04/2009 Document Revised: 11/06/2014 Document Reviewed: 10/04/2009 °Elsevier Interactive Patient Education ©2016 [employer] ° ° ° °

## 2015-10-15 NOTE — Progress Notes (Signed)
    Patient doing well Patient denies right arm pain   Physical Exam: Filed Vitals:   10/14/15 2324 10/15/15 0413  BP: 166/64 164/63  Pulse: 98 20  Temp: 98.9 F (37.2 C) 98.6 F (37 C)  Resp: 18 20    Dressing in place NVI Patient appears comfortable  POD #1 s/p C4/5 ACDF doing very well  - encourage ambulation - Dilaudid for pain, Valium for muscle spasms - likely d/c home later today

## 2015-10-15 NOTE — Progress Notes (Signed)
Pt doing well. Pt's incision is clean and dry with no sign of infection. Pt's IV was removed prior to D/C. Pt and husband given D/C instructions with Rx's, verbal understanding was provided. Pt D/C'd home via wheelchair @ 0940 per MD order. Pt is stable @ D/C and has no other needs at this time. Holli Humbles, RN

## 2015-10-28 NOTE — Discharge Summary (Signed)
Patient ID: [name] MRN: [id number] DOB/AGE: 05/04/1951 64 y.o.  Admit date: 10/14/2015 Discharge date: 10/15/2015  Admission Diagnoses:  Active Problems:   Radiculopathy   Discharge Diagnoses:  Same  Past Medical History  Diagnosis Date  . Dysrhythmia   . History of hiatal hernia   . GERD (gastroesophageal reflux disease)     borderline bleeding ulcer   . Vertigo   . Arthritis   . Cancer [facility])     b cell leukemia  1999 , non hodkins lymphoma 2002    Surgeries: Procedure(s): ANTERIOR CERVICAL DECOMPRESSION/DISCECTOMY FUSION 1 LEVEL C4-5 on 10/14/2015   Consultants:  None  Discharged Condition: Improved  Hospital Course: Kathleen Duran is an 64 y.o. female who was admitted 10/14/2015 for operative treatment of radiculopathy. Patient has severe unremitting pain that affects sleep, daily activities, and work/hobbies. After pre-op clearance the patient was taken to the operating room on 10/14/2015 and underwent  Procedure(s): ANTERIOR CERVICAL DECOMPRESSION/DISCECTOMY FUSION 1 LEVEL C4-5.    Patient was given perioperative antibiotics:  Anti-infectives    Start     Dose/Rate Route Frequency Ordered Stop   10/14/15 2000  vancomycin (VANCOCIN) IVPB 1000 mg/200 mL premix     1,000 mg 200 mL/hr over 60 Minutes Intravenous  Once 10/14/15 1354 10/14/15 2120   10/14/15 0800  vancomycin (VANCOCIN) IVPB 1000 mg/200 mL premix  Status:  Discontinued     1,000 mg 200 mL/hr over 60 Minutes Intravenous To Surgery 10/14/15 0745 10/14/15 1044       Patient was given sequential compression devices, early ambulation to prevent DVT.  Patient benefited maximally from hospital stay and there were no complications.    Recent vital signs: BP 157/65 mmHg  Pulse 89  Temp(Src) 98.7 F (37.1 C) (Oral)  Resp 20  Ht 5' 2.5" (1.588 m)  Wt 95.255 kg (210 lb)  BMI 37.77 kg/m2  SpO2 99%  Discharge Medications:     Medication List    STOP taking these medications          aspirin EC 81 MG tablet      TAKE these medications        atorvastatin 40 MG tablet  Commonly known as:  LIPITOR  Take 40 mg by mouth at bedtime.     BIOTIN MAXIMUM STRENGTH 10 MG Tabs  Generic drug:  Biotin  Take 10 mg by mouth daily.     CALCIUM 600 + D PO  Take 1 tablet by mouth daily.     clobetasol cream 0.05 %  Commonly known as:  TEMOVATE  Apply 1 application topically daily as needed (rash).     HYDROmorphone 2 MG tablet  Commonly known as:  DILAUDID  Take 0.5-1 tablets (1-2 mg total) by mouth every 4 (four) hours as needed for severe pain.     irbesartan 300 MG tablet  Commonly known as:  AVAPRO  Take 300 mg by mouth at bedtime.     metoprolol 50 MG tablet  Commonly known as:  LOPRESSOR  Take 50 mg by mouth daily.     multivitamin with minerals Tabs tablet  Take 1 tablet by mouth daily.     omeprazole-sodium bicarbonate 40-1100 MG capsule  Commonly known as:  ZEGERID  Take 1 capsule by mouth daily before breakfast.     ROGAINE WOMENS EX  Apply 2 mLs topically daily. Apply to scalp     vitamin C 500 MG tablet  Commonly known as:  ASCORBIC ACID  Take 1,000 mg by mouth daily.     vitamin E 400 UNIT capsule  Take 400 Units by mouth daily.        Diagnostic Studies: Dg Chest 2 View  10/11/2015  CLINICAL DATA:  Preop neck surgery. EXAM: CHEST  2 VIEW COMPARISON:  None. FINDINGS: Heart and mediastinal contours are within normal limits. No focal opacities or effusions. No acute bony abnormality. IMPRESSION: No active cardiopulmonary disease. Electronically Signed   By: Rolm Baptise M.D.   On: 10/11/2015 13:40   Dg Cervical Spine 1 View  10/14/2015  CLINICAL DATA:  Cervical spine fusion. EXAM: DG C-ARM 61-120 MIN; DG CERVICAL SPINE - 1 VIEW COMPARISON:  None. FINDINGS: C4-C5 anterior and interbody fusion. No acute bony abnormality. Hardware intact. IMPRESSION: C4-C5 anterior and interbody fusion.  No acute abnormality. Electronically Signed   By:  Marcello Moores  Register   On: 10/14/2015 10:30   Dg C-arm 1-60 Min  10/14/2015  CLINICAL DATA:  Cervical spine fusion. EXAM: DG C-ARM 61-120 MIN; DG CERVICAL SPINE - 1 VIEW COMPARISON:  None. FINDINGS: C4-C5 anterior and interbody fusion. No acute bony abnormality. Hardware intact. IMPRESSION: C4-C5 anterior and interbody fusion.  No acute abnormality. Electronically Signed   By: Marcello Moores  Register   On: 10/14/2015 10:30    Disposition: 01-Home or Self Care   POD #1 s/p C4/5 ACDF doing very well  - encourage ambulation - Dilaudid for pain, Valium for muscle spasms -Written scripts for pain signed and in chart -D/C instructions sheet printed and in chart -D/C today  -F/U in office 2 weeks   Signed: [name] 10/28/2015, 12:17 PM

## 2016-03-30 ENCOUNTER — Other Ambulatory Visit: Payer: Self-pay | Admitting: Orthopedic Surgery

## 2016-04-10 ENCOUNTER — Encounter (HOSPITAL_BASED_OUTPATIENT_CLINIC_OR_DEPARTMENT_OTHER): Payer: Self-pay | Admitting: *Deleted

## 2016-04-17 ENCOUNTER — Ambulatory Visit (HOSPITAL_BASED_OUTPATIENT_CLINIC_OR_DEPARTMENT_OTHER)
Admission: RE | Admit: 2016-04-17 | Discharge: 2016-04-17 | Disposition: A | Discharge status: Home / Self Care | Payer: Medicare Other | Source: Ambulatory Visit | Attending: Orthopedic Surgery | Admitting: Orthopedic Surgery

## 2016-04-17 ENCOUNTER — Ambulatory Visit (HOSPITAL_BASED_OUTPATIENT_CLINIC_OR_DEPARTMENT_OTHER): Payer: Medicare Other | Admitting: Anesthesiology

## 2016-04-17 ENCOUNTER — Encounter (HOSPITAL_BASED_OUTPATIENT_CLINIC_OR_DEPARTMENT_OTHER): Payer: Self-pay | Admitting: Anesthesiology

## 2016-04-17 ENCOUNTER — Encounter (HOSPITAL_BASED_OUTPATIENT_CLINIC_OR_DEPARTMENT_OTHER)
Admission: RE | Disposition: A | Discharge status: Home / Self Care | Payer: Self-pay | Source: Ambulatory Visit | Attending: Orthopedic Surgery

## 2016-04-17 DIAGNOSIS — M75121 Complete rotator cuff tear or rupture of right shoulder, not specified as traumatic: Secondary | ICD-10-CM | POA: Insufficient documentation

## 2016-04-17 DIAGNOSIS — Z7982 Long term (current) use of aspirin: Secondary | ICD-10-CM | POA: Insufficient documentation

## 2016-04-17 DIAGNOSIS — Z8572 Personal history of non-Hodgkin lymphomas: Secondary | ICD-10-CM | POA: Insufficient documentation

## 2016-04-17 DIAGNOSIS — Z79899 Other long term (current) drug therapy: Secondary | ICD-10-CM | POA: Diagnosis not present

## 2016-04-17 DIAGNOSIS — Z880 Allergy status to penicillin: Secondary | ICD-10-CM | POA: Diagnosis not present

## 2016-04-17 HISTORY — PX: SHOULDER ARTHROSCOPY WITH ROTATOR CUFF REPAIR AND SUBACROMIAL DECOMPRESSION: SHX5686

## 2016-04-17 HISTORY — PX: SHOULDER ARTHROSCOPY WITH BICEPSTENOTOMY: SHX6204

## 2016-04-17 SURGERY — SHOULDER ARTHROSCOPY WITH ROTATOR CUFF REPAIR AND SUBACROMIAL DECOMPRESSION
Anesthesia: General | Site: Shoulder | Laterality: Right

## 2016-04-17 MED ORDER — VANCOMYCIN HCL IN DEXTROSE 1-5 GM/200ML-% IV SOLN
1000.0000 mg | INTRAVENOUS | Status: AC
  Administered 2016-04-17: 1000 mg via INTRAVENOUS

## 2016-04-17 MED ORDER — GLYCOPYRROLATE 0.2 MG/ML IJ SOLN: 0.2000 mg | Freq: Once | INTRAMUSCULAR | Status: DC | PRN

## 2016-04-17 MED ORDER — LIDOCAINE HCL (CARDIAC) 20 MG/ML IV SOLN
INTRAVENOUS | Status: DC | PRN
  Administered 2016-04-17: 100 mg via INTRAVENOUS

## 2016-04-17 MED ORDER — LACTATED RINGERS IV SOLN
INTRAVENOUS | Status: DC
  Administered 2016-04-17: 13:00:00 via INTRAVENOUS
  Administered 2016-04-17: 10 mL/h via INTRAVENOUS

## 2016-04-17 MED ORDER — SCOPOLAMINE 1 MG/3DAYS TD PT72: 1.0000 | MEDICATED_PATCH | Freq: Once | TRANSDERMAL | Status: DC | PRN

## 2016-04-17 MED ORDER — MIDAZOLAM HCL 2 MG/2ML IJ SOLN
1.0000 mg | INTRAMUSCULAR | Status: DC | PRN
  Administered 2016-04-17: 2 mg via INTRAVENOUS

## 2016-04-17 MED ORDER — DEXAMETHASONE SODIUM PHOSPHATE 10 MG/ML IJ SOLN
INTRAMUSCULAR | Status: AC
  Filled 2016-04-17: qty 1

## 2016-04-17 MED ORDER — BUPIVACAINE-EPINEPHRINE (PF) 0.5% -1:200000 IJ SOLN
INTRAMUSCULAR | Status: DC | PRN
  Administered 2016-04-17: 30 mL via PERINEURAL

## 2016-04-17 MED ORDER — MEPERIDINE HCL 25 MG/ML IJ SOLN: 6.2500 mg | INTRAMUSCULAR | Status: DC | PRN

## 2016-04-17 MED ORDER — FENTANYL CITRATE (PF) 100 MCG/2ML IJ SOLN
INTRAMUSCULAR | Status: AC
  Filled 2016-04-17: qty 2

## 2016-04-17 MED ORDER — HYDROMORPHONE HCL 1 MG/ML IJ SOLN
0.2500 mg | INTRAMUSCULAR | Status: DC | PRN
Start: 2016-04-17 — End: 2016-04-17

## 2016-04-17 MED ORDER — SUGAMMADEX SODIUM 200 MG/2ML IV SOLN
INTRAVENOUS | Status: DC | PRN
  Administered 2016-04-17: 200 mg via INTRAVENOUS

## 2016-04-17 MED ORDER — ONDANSETRON HCL 4 MG/2ML IJ SOLN: 4.0000 mg | Freq: Once | INTRAMUSCULAR | Status: DC | PRN

## 2016-04-17 MED ORDER — LIDOCAINE HCL 4 % EX SOLN
CUTANEOUS | Status: DC | PRN
  Administered 2016-04-17: 3 mL via TOPICAL

## 2016-04-17 MED ORDER — PROPOFOL 10 MG/ML IV BOLUS
INTRAVENOUS | Status: AC
  Filled 2016-04-17: qty 20

## 2016-04-17 MED ORDER — ROCURONIUM BROMIDE 100 MG/10ML IV SOLN
INTRAVENOUS | Status: DC | PRN
  Administered 2016-04-17: 50 mg via INTRAVENOUS

## 2016-04-17 MED ORDER — FENTANYL CITRATE (PF) 100 MCG/2ML IJ SOLN
50.0000 ug | INTRAMUSCULAR | Status: DC | PRN
  Administered 2016-04-17 (×2): 100 ug via INTRAVENOUS

## 2016-04-17 MED ORDER — VANCOMYCIN HCL IN DEXTROSE 1-5 GM/200ML-% IV SOLN
INTRAVENOUS | Status: AC
  Filled 2016-04-17: qty 200

## 2016-04-17 MED ORDER — ONDANSETRON HCL 4 MG/2ML IJ SOLN
INTRAMUSCULAR | Status: AC
  Filled 2016-04-17: qty 2

## 2016-04-17 MED ORDER — OXYCODONE-ACETAMINOPHEN 5-325 MG PO TABS: 1.0000 | ORAL_TABLET | ORAL | Status: DC | PRN

## 2016-04-17 MED ORDER — EPHEDRINE SULFATE 50 MG/ML IJ SOLN
INTRAMUSCULAR | Status: DC | PRN
  Administered 2016-04-17: 5 mg via INTRAVENOUS
  Administered 2016-04-17: 10 mg via INTRAVENOUS
  Administered 2016-04-17: 5 mg via INTRAVENOUS
  Administered 2016-04-17: 10 mg via INTRAVENOUS

## 2016-04-17 MED ORDER — MIDAZOLAM HCL 2 MG/2ML IJ SOLN
INTRAMUSCULAR | Status: AC
  Filled 2016-04-17: qty 2

## 2016-04-17 MED ORDER — DOCUSATE SODIUM 100 MG PO CAPS: 100.0000 mg | ORAL_CAPSULE | Freq: Three times a day (TID) | ORAL | Status: DC | PRN

## 2016-04-17 MED ORDER — LIDOCAINE 2% (20 MG/ML) 5 ML SYRINGE
INTRAMUSCULAR | Status: AC
  Filled 2016-04-17: qty 10

## 2016-04-17 MED ORDER — ONDANSETRON HCL 4 MG/2ML IJ SOLN
INTRAMUSCULAR | Status: DC | PRN
  Administered 2016-04-17: 4 mg via INTRAVENOUS

## 2016-04-17 MED ORDER — SODIUM CHLORIDE 0.9 % IR SOLN
Status: DC | PRN
Start: 2016-04-17 — End: 2016-04-17
  Administered 2016-04-17: 6000 mL

## 2016-04-17 MED ORDER — PROPOFOL 10 MG/ML IV BOLUS
INTRAVENOUS | Status: DC | PRN
  Administered 2016-04-17: 120 mg via INTRAVENOUS

## 2016-04-17 MED ORDER — DEXAMETHASONE SODIUM PHOSPHATE 4 MG/ML IJ SOLN
INTRAMUSCULAR | Status: DC | PRN
  Administered 2016-04-17: 10 mg via INTRAVENOUS

## 2016-04-17 MED ORDER — POVIDONE-IODINE 7.5 % EX SOLN: Freq: Once | CUTANEOUS | Status: DC

## 2016-04-17 SURGICAL SUPPLY — 83 items
ANCHOR SUT QUATTRO KNTLS 4.5 (Anchor) ×16 IMPLANT
BENZOIN TINCTURE PRP APPL 2/3 (GAUZE/BANDAGES/DRESSINGS) IMPLANT
BLADE CLIPPER SURG (BLADE) IMPLANT
BLADE SURG 15 STRL LF DISP TIS (BLADE) IMPLANT
BLADE SURG 15 STRL SS (BLADE)
BUR OVAL 4.0 (BURR) ×4 IMPLANT
CANNULA 5.75X71 LONG (CANNULA) ×4 IMPLANT
CANNULA TWIST IN 8.25X7CM (CANNULA) ×4 IMPLANT
CHLORAPREP W/TINT 26ML (MISCELLANEOUS) ×4 IMPLANT
CLOSURE WOUND 1/2 X4 (GAUZE/BANDAGES/DRESSINGS)
DECANTER SPIKE VIAL GLASS SM (MISCELLANEOUS) IMPLANT
DRAPE IMP U-DRAPE 54X76 (DRAPES) ×4 IMPLANT
DRAPE INCISE IOBAN 66X45 STRL (DRAPES) ×4 IMPLANT
DRAPE STERI 35X30 U-POUCH (DRAPES) ×4 IMPLANT
DRAPE SURG 17X23 STRL (DRAPES) ×4 IMPLANT
DRAPE U-SHAPE 47X51 STRL (DRAPES) ×4 IMPLANT
DRAPE U-SHAPE 76X120 STRL (DRAPES) ×8 IMPLANT
DRSG PAD ABDOMINAL 8X10 ST (GAUZE/BANDAGES/DRESSINGS) ×4 IMPLANT
ELECT REM PT RETURN 9FT ADLT (ELECTROSURGICAL) ×4
ELECTRODE REM PT RTRN 9FT ADLT (ELECTROSURGICAL) ×2 IMPLANT
GAUZE SPONGE 4X4 12PLY STRL (GAUZE/BANDAGES/DRESSINGS) ×4 IMPLANT
GAUZE SPONGE 4X4 16PLY XRAY LF (GAUZE/BANDAGES/DRESSINGS) IMPLANT
GAUZE XEROFORM 1X8 LF (GAUZE/BANDAGES/DRESSINGS) ×4 IMPLANT
GLOVE BIO SURGEON STRL SZ7 (GLOVE) ×4 IMPLANT
GLOVE BIO SURGEON STRL SZ7.5 (GLOVE) ×8 IMPLANT
GLOVE BIOGEL PI IND STRL 7.0 (GLOVE) ×6 IMPLANT
GLOVE BIOGEL PI IND STRL 8 (GLOVE) ×2 IMPLANT
GLOVE BIOGEL PI INDICATOR 7.0 (GLOVE) ×6
GLOVE BIOGEL PI INDICATOR 8 (GLOVE) ×2
GLOVE ECLIPSE 6.5 STRL STRAW (GLOVE) ×4 IMPLANT
GOWN STRL REUS W/ TWL LRG LVL3 (GOWN DISPOSABLE) ×4 IMPLANT
GOWN STRL REUS W/ TWL XL LVL3 (GOWN DISPOSABLE) ×2 IMPLANT
GOWN STRL REUS W/TWL LRG LVL3 (GOWN DISPOSABLE) ×4
GOWN STRL REUS W/TWL XL LVL3 (GOWN DISPOSABLE) ×2
LASSO CRESCENT QUICKPASS (SUTURE) IMPLANT
LIQUID BAND (GAUZE/BANDAGES/DRESSINGS) IMPLANT
MANIFOLD NEPTUNE II (INSTRUMENTS) ×4 IMPLANT
NDL SUT 6 .5 CRC .975X.05 MAYO (NEEDLE) IMPLANT
NEEDLE 1/2 CIR CATGUT .05X1.09 (NEEDLE) IMPLANT
NEEDLE MAYO TAPER (NEEDLE)
NEEDLE SCORPION MULTI FIRE (NEEDLE) ×4 IMPLANT
NS IRRIG 1000ML POUR BTL (IV SOLUTION) IMPLANT
PACK ARTHROSCOPY DSU (CUSTOM PROCEDURE TRAY) ×4 IMPLANT
PACK BASIN DAY SURGERY FS (CUSTOM PROCEDURE TRAY) ×4 IMPLANT
PENCIL BUTTON HOLSTER BLD 10FT (ELECTRODE) IMPLANT
RESECTOR FULL RADIUS 4.2MM (BLADE) ×4 IMPLANT
SHEET MEDIUM DRAPE 40X70 STRL (DRAPES) IMPLANT
SLEEVE SCD COMPRESS KNEE MED (MISCELLANEOUS) ×4 IMPLANT
SLING ARM FOAM STRAP LRG (SOFTGOODS) IMPLANT
SLING ARM IMMOBILIZER LRG (SOFTGOODS) ×4 IMPLANT
SLING ARM IMMOBILIZER MED (SOFTGOODS) IMPLANT
SLING ARM MED ADULT FOAM STRAP (SOFTGOODS) IMPLANT
SLING ARM XL FOAM STRAP (SOFTGOODS) IMPLANT
SPONGE LAP 4X18 X RAY DECT (DISPOSABLE) IMPLANT
STRIP CLOSURE SKIN 1/2X4 (GAUZE/BANDAGES/DRESSINGS) IMPLANT
SUCTION FRAZIER HANDLE 10FR (MISCELLANEOUS)
SUCTION TUBE FRAZIER 10FR DISP (MISCELLANEOUS) IMPLANT
SUPPORT WRAP ARM LG (MISCELLANEOUS) ×4 IMPLANT
SUT BONE WAX W31G (SUTURE) IMPLANT
SUT ETHIBOND 2 OS 4 DA (SUTURE) IMPLANT
SUT ETHILON 3 0 PS 1 (SUTURE) ×4 IMPLANT
SUT ETHILON 4 0 PS 2 18 (SUTURE) IMPLANT
SUT FIBERWIRE #2 38 T-5 BLUE (SUTURE)
SUT MNCRL AB 3-0 PS2 18 (SUTURE) IMPLANT
SUT MNCRL AB 4-0 PS2 18 (SUTURE) IMPLANT
SUT PDS AB 0 CT 36 (SUTURE) IMPLANT
SUT PROLENE 3 0 PS 2 (SUTURE) IMPLANT
SUT TIGER TAPE 7 IN WHITE (SUTURE) ×4 IMPLANT
SUT VIC AB 0 CT1 27 (SUTURE)
SUT VIC AB 0 CT1 27XBRD ANBCTR (SUTURE) IMPLANT
SUT VIC AB 2-0 SH 27 (SUTURE)
SUT VIC AB 2-0 SH 27XBRD (SUTURE) IMPLANT
SUTURE FIBERWR #2 38 T-5 BLUE (SUTURE) IMPLANT
SYR BULB 3OZ (MISCELLANEOUS) IMPLANT
TAPE FIBER 2MM 7IN #2 BLUE (SUTURE) ×4 IMPLANT
TOWEL OR 17X24 6PK STRL BLUE (TOWEL DISPOSABLE) ×4 IMPLANT
TOWEL OR NON WOVEN STRL DISP B (DISPOSABLE) ×4 IMPLANT
TUBE CONNECTING 20'X1/4 (TUBING) ×1
TUBE CONNECTING 20X1/4 (TUBING) ×3 IMPLANT
TUBING ARTHROSCOPY IRRIG 16FT (MISCELLANEOUS) ×4 IMPLANT
WAND STAR VAC 90 (SURGICAL WAND) ×4 IMPLANT
WATER STERILE IRR 1000ML POUR (IV SOLUTION) ×4 IMPLANT
YANKAUER SUCT BULB TIP NO VENT (SUCTIONS) IMPLANT

## 2016-04-17 NOTE — Anesthesia Postprocedure Evaluation (Signed)
Anesthesia Post Note  Patient: Kathleen Duran  Procedure(s) Performed: Procedure(s) (LRB): SHOULDER ARTHROSCOPY ROTATOR CUFF TEAR AND SUBACROMIAL DECOMPRESSION AND BICEPS TENOTOMY (Right) SHOULDER ARTHROSCOPY WITH BICEPSTENOTOMY (Right)  Patient location during evaluation: PACU Anesthesia Type: General Level of consciousness: awake and alert Pain management: pain level controlled Vital Signs Assessment: post-procedure vital signs reviewed and stable Respiratory status: spontaneous breathing, nonlabored ventilation, respiratory function stable and patient connected to nasal cannula oxygen Cardiovascular status: blood pressure returned to baseline and stable Postop Assessment: no signs of nausea or vomiting Anesthetic complications: no    Last Vitals:  Filed Vitals:   04/17/16 1315 04/17/16 1345  BP: 129/62 126/80  Pulse: 89 88  Temp:  36.5 C  Resp: 21 16    Last Pain:  Filed Vitals:   04/17/16 1359  PainSc: 0-No pain                 Jeweline Reif DAVID

## 2016-04-17 NOTE — Anesthesia Preprocedure Evaluation (Signed)
Anesthesia Evaluation  Patient identified by MRN, date of birth, ID band Patient awake    Reviewed: Allergy & Precautions, NPO status , Patient's Chart, lab work & pertinent test results  Airway Mallampati: I  TM Distance: >3 FB Neck ROM: Full    Dental   Pulmonary    Pulmonary exam normal        Cardiovascular Normal cardiovascular exam     Neuro/Psych    GI/Hepatic GERD  Medicated and Controlled,  Endo/Other    Renal/GU      Musculoskeletal   Abdominal   Peds  Hematology   Anesthesia Other Findings   Reproductive/Obstetrics                             Anesthesia Physical Anesthesia Plan  ASA: II  Anesthesia Plan: General   Post-op Pain Management: GA combined w/ Regional for post-op pain   Induction: Intravenous  Airway Management Planned: Oral ETT  Additional Equipment:   Intra-op Plan:   Post-operative Plan: Extubation in OR  Informed Consent: I have reviewed the patients History and Physical, chart, labs and discussed the procedure including the risks, benefits and alternatives for the proposed anesthesia with the patient or authorized representative who has indicated his/her understanding and acceptance.     Plan Discussed with: CRNA and Surgeon  Anesthesia Plan Comments:         Anesthesia Quick Evaluation

## 2016-04-17 NOTE — Anesthesia Procedure Notes (Addendum)
Procedure Name: Intubation Date/Time: 04/17/2016 11:46 AM Performed by: Maryella Shivers Pre-anesthesia Checklist: Patient identified, Emergency Drugs available, Suction available and Patient being monitored Patient Re-evaluated:Patient Re-evaluated prior to inductionOxygen Delivery Method: Circle system utilized Preoxygenation: Pre-oxygenation with 100% oxygen Intubation Type: IV induction Ventilation: Mask ventilation without difficulty Laryngoscope Size: Mac and 3 Grade View: Grade I Tube type: Oral Tube size: 7.0 mm Number of attempts: 1 Airway Equipment and Method: Stylet and Oral airway Placement Confirmation: ETT inserted through vocal cords under direct vision,  positive ETCO2 and breath sounds checked- equal and bilateral Secured at: 19 cm Tube secured with: Tape Dental Injury: Teeth and Oropharynx as per pre-operative assessment    Anesthesia Regional Block:  Interscalene brachial plexus block  Pre-Anesthetic Checklist: ,, timeout performed, Correct Patient, Correct Site, Correct Laterality, Correct Procedure, Correct Position, site marked, Risks and benefits discussed,  Surgical consent,  Pre-op evaluation,  At surgeon's request and post-op pain management  Laterality: Right  Prep: chloraprep       Needles:  Injection technique: Single-shot  Needle Type: Echogenic Stimulator Needle     Needle Length: 10cm 10 cm Needle Gauge: 21 and 21 G    Additional Needles:  Procedures: ultrasound guided (picture in chart) and nerve stimulator Interscalene brachial plexus block  Nerve Stimulator or Paresthesia:  Response: 0.4 mA,   Additional Responses:   Narrative:  Start time: 04/17/2016 11:15 AM End time: 04/17/2016 11:25 AM Injection made incrementally with aspirations every 5 mL.  Performed by: Personally  Anesthesiologist: Lillia Abed  Additional Notes: Monitors applied. Patient sedated. Sterile prep and drape,hand hygiene and sterile gloves were used.  Relevant anatomy identified.Needle position confirmed.Local anesthetic injected incrementally after negative aspiration. Local anesthetic spread visualized around nerve(s). Vascular puncture avoided. No complications. Image printed for medical record.The patient tolerated the procedure well.

## 2016-04-17 NOTE — Op Note (Signed)
Procedure(s): SHOULDER ARTHROSCOPY ROTATOR CUFF TEAR AND SUBACROMIAL DECOMPRESSION AND BICEPS TENOTOMY SHOULDER ARTHROSCOPY WITH BICEPSTENOTOMY Procedure Note  Kathleen Duran female 65 y.o. 04/17/2016  Procedure(s) and Anesthesia Type: #1 right shoulder arthroscopic rotator cuff repair #2 right shoulder arthroscopic biceps tenotomy and debridement #3 right shoulder arthroscopic subacromial decompression  Surgeon(s) and Role:    * Tania Ade, MD - Primary     Surgeon: Nita Sells   Assistants: Jeanmarie Hubert PA-C (Kathleen Duran was present and scrubbed throughout the procedure and was essential in positioning, assisting with the camera and instrumentation,, and closure)  Anesthesia: General endotracheal anesthesia with preoperative interscalene block given by the attending anesthesiologist    Procedure Detail  SHOULDER ARTHROSCOPY ROTATOR CUFF TEAR AND SUBACROMIAL DECOMPRESSION AND BICEPS TENOTOMY, SHOULDER ARTHROSCOPY WITH BICEPSTENOTOMY  Estimated Blood Loss: Min         Drains: none  Blood Given: none         Specimens: none        Complications:  * No complications entered in OR log *         Disposition: PACU - hemodynamically stable.         Condition: stable    Procedure:   INDICATIONS FOR SURGERY: The patient is 65 y.o. female who has had a long history of right shoulder pain which is failed extensive conservative management. She has had recent MRI which did show progression to full thickness rotator cuff tear. Indicated for surgical treatment to try and decrease pain and restore function. She understood risks benefits alternatives and wished to go forward with surgery.  OPERATIVE FINDINGS: Examination under anesthesia: No significant stiffness or instability   DESCRIPTION OF PROCEDURE: The patient was identified in preoperative  holding area where I personally marked the operative site after  verifying site, side, and procedure with  the patient. An interscalene block was given by the attending anesthesiologist the holding area.  The patient was taken back to the operating room where general anesthesia was induced without complication and was placed in the beach-chair position with the back  elevated about 60 degrees and all extremities and head and neck carefully padded and  positioned.   The right upper extremity was then prepped and  draped in a standard sterile fashion. The appropriate time-out  procedure was carried out. The patient did receive IV antibiotics  within 30 minutes of incision.   A small posterior portal incision was made and the arthroscope was introduced into the joint. An anterior portal was then established above the subscapularis using needle localization. Small cannula was placed anteriorly. Diagnostic arthroscopy was then carried out.  She was noted to have extensive tearing of the proximal long head biceps tendon involving about 70% of the tendon diameter proximally. This was extensively debrided with shaver. Felt that this was certainly a pain generator for her and should be addressed. Therefore a large biter was used through the anterior portal to release the proximal long head biceps from the superior labrum. The superior labrum was then extensively debrided. The subscapularis had some mild fraying no full-thickness tear. Glenohumeral joint surfaces were intact without significant chondromalacia. She did have a full-thickness tear of the supraspinatus. This was debrided from the undersurface. There is no significant retraction.  The arthroscope was then introduced into the subacromial space a standard lateral portal was established with needle localization. The shaver was used through the lateral portal to perform extensive bursectomy. Coracoacromial ligament was examined and found to be frayed indicating chronic impingement.  Rotator cuff tear was identified from the bursal side. A large cannula was  placed laterally. The small cannula was moved anteriorly into the subacromial space. The tear edge was debrided extensively. A posterior lateral viewing portal was established. The tear was felt to be repairable. The tuberosity was debrided down to bleeding bone. The repair is an carried out by placing 2 4.75 peak time and anchors preloaded with fiber tape just off the articular margin evenly spaced anterior and posterior. These 4 suture strands were then passed evenly throughout the tear anterior to posterior. These were then brought over into crossing suture bridge pattern to 2 additional 4.75 anchors in the lateral row bringing the tendon down nicely over the prepared tuberosity. The bone along the lateral metaphysis was very thin and anchors were placed slightly higher to allow good bony fixation.   The coracoacromial ligament was taken down off the anterior acromion with the ArthroCare exposing a moderate  anterior acromial spur. A high-speed bur was then used through the lateral portal to take down the anterior acromial spur from lateral to medial in a standard acromioplasty.  The acromioplasty was also viewed from the lateral portal and the bur was used as necessary to ensure that the acromion was completely flat from posterior to anterior.  The arthroscopic equipment was removed from the joint and the portals were closed with 3-0 nylon in an interrupted fashion. Sterile dressings were then applied including Xeroform 4 x 4's ABDs and tape. The patient was then allowed to awaken from general anesthesia, placed in a sling, transferred to the stretcher and taken to the recovery room in stable condition.   POSTOPERATIVE PLAN: The patient will be discharged home today and will followup in one week for suture removal and wound check.  She will follow the standard cuff protocol.

## 2016-04-17 NOTE — Progress Notes (Signed)
Assisted Dr. Ossey with right, ultrasound guided, supraclavicular block. Side rails up, monitors on throughout procedure. See vital signs in flow sheet. Tolerated Procedure well. 

## 2016-04-17 NOTE — Transfer of Care (Signed)
Immediate Anesthesia Transfer of Care Note  Patient: Kathleen Duran  Procedure(s) Performed: Procedure(s) with comments: SHOULDER ARTHROSCOPY ROTATOR CUFF TEAR AND SUBACROMIAL DECOMPRESSION AND BICEPS TENOTOMY (Right) - RIGHT SHOULDER ARTHROSCOPY ROTATOR CUFF TEAR AND SUBACROMIAL DECOMPRESSION SHOULDER ARTHROSCOPY WITH BICEPSTENOTOMY (Right)  Patient Location: PACU  Anesthesia Type:General  Level of Consciousness: awake and alert   Airway & Oxygen Therapy: Patient Spontanous Breathing and Patient connected to face mask oxygen  Post-op Assessment: Report given to RN and Post -op Vital signs reviewed and stable  Post vital signs: Reviewed and stable  Last Vitals:  Filed Vitals:   04/17/16 1120 04/17/16 1125  BP:  151/70  Pulse: 75 80  Temp:    Resp: 15 28    Last Pain:  Filed Vitals:   04/17/16 1129  PainSc: 6          Complications: No apparent anesthesia complications

## 2016-04-17 NOTE — Anesthesia Postprocedure Evaluation (Signed)
Anesthesia Post Note  Patient: Nieasha Mcaulay Adams  Procedure(s) Performed: Procedure(s) (LRB): SHOULDER ARTHROSCOPY ROTATOR CUFF TEAR AND SUBACROMIAL DECOMPRESSION AND BICEPS TENOTOMY (Right) SHOULDER ARTHROSCOPY WITH BICEPSTENOTOMY (Right)  Patient location during evaluation: PACU Anesthesia Type: General Level of consciousness: awake and alert Pain management: pain level controlled Vital Signs Assessment: post-procedure vital signs reviewed and stable Respiratory status: spontaneous breathing, nonlabored ventilation, respiratory function stable and patient connected to nasal cannula oxygen Cardiovascular status: blood pressure returned to baseline and stable Postop Assessment: no signs of nausea or vomiting Anesthetic complications: no    Last Vitals:  Filed Vitals:   04/17/16 1315 04/17/16 1345  BP: 129/62 126/80  Pulse: 89 88  Temp:  36.5 C  Resp: 21 16    Last Pain:  Filed Vitals:   04/17/16 1359  PainSc: 0-No pain                 Ryonna Cimini DAVID

## 2016-04-17 NOTE — H&P (Signed)
Kathleen Duran is an 65 y.o. female.   Chief Complaint: R shoulder pain HPI: 65 yo female with years of worsening R shoulder pain with progression to full thickness RCT  Past Medical History  Diagnosis Date  . History of hiatal hernia   . GERD (gastroesophageal reflux disease)     borderline bleeding ulcer   . Vertigo   . Arthritis   . Cancer [facility])     b cell leukemia  1999 , non hodkins lymphoma 2002    Past Surgical History  Procedure Laterality Date  . Spleenectomy 1999    . Hernia repair      umbilical [id number]  . Cholecystectomy      2006  . Strangulated umbilical hernia [id number]    . Tonsillectomy      left tonsil  . Anterior cervical decomp/discectomy fusion N/A 10/14/2015    Procedure: ANTERIOR CERVICAL DECOMPRESSION/DISCECTOMY FUSION 1 LEVEL;  Surgeon: Phylliss Bob, MD;  Location: [place];  Service: Orthopedics;  Laterality: N/A;  Anterior cervical decompression fusion, cerivcal 4-5 with instrumentation and allograft    History reviewed. No pertinent family history. Social History:  reports that she has never smoked. She does not have any smokeless tobacco history on file. She reports that she drinks alcohol. She reports that she does not use illicit drugs.  Allergies:  Allergies  Allergen Reactions  . Cephalosporins Other (See Comments)    ALLERGY to PCN with immediate rash, facial/tongue/throat swelling, SOB, lightheadedness with hypotension.  . Contrast Media [Iodinated Diagnostic Agents] Hives and Shortness Of Breath  . Penicillins Hives    Has patient had a PCN reaction causing immediate rash, facial/tongue/throat swelling, SOB or lightheadedness with hypotension: Yes Has patient had a PCN reaction causing severe rash involving mucus membranes or skin necrosis: No Has patient had a PCN reaction that required hospitalization No Has patient had a PCN reaction occurring within the last 10 years: No If all of the above answers are "NO", then may proceed with  Cephalosporin use.  Marland Kitchen Amoxicillin Hives  . Codeine Nausea And Vomiting  . Keflex [Cephalexin] Hives    Medications Prior to Admission  Medication Sig Dispense Refill  . aspirin 81 MG tablet Take 81 mg by mouth daily.    Marland Kitchen atorvastatin (LIPITOR) 40 MG tablet Take 40 mg by mouth at bedtime.    . Biotin (BIOTIN MAXIMUM STRENGTH) 10 MG TABS Take 10 mg by mouth daily.    . Calcium Carb-Cholecalciferol (CALCIUM 600 + D PO) Take 1 tablet by mouth daily.    . clobetasol cream (TEMOVATE) [id number] % Apply 1 application topically daily as needed (rash).    . irbesartan (AVAPRO) 300 MG tablet Take 300 mg by mouth at bedtime.    . metoprolol (LOPRESSOR) 50 MG tablet Take 50 mg by mouth daily.    . Multiple Vitamin (MULTIVITAMIN WITH MINERALS) TABS tablet Take 1 tablet by mouth daily.    Marland Kitchen omeprazole-sodium bicarbonate (ZEGERID) 40-1100 MG capsule Take 1 capsule by mouth daily before breakfast.    . vitamin C (ASCORBIC ACID) 500 MG tablet Take 1,000 mg by mouth daily.    . vitamin E 400 UNIT capsule Take 400 Units by mouth daily.      No results found for this or any previous visit (from the past 48 hour(s)). No results found.  Review of Systems  All other systems reviewed and are negative.   Blood pressure 151/76, pulse 75, temperature 98.3 F (36.8 C), temperature source Oral, resp. rate  20, height 5\' 2"  (1.575 m), weight 95.437 kg (210 lb 6.4 oz), SpO2 99 %. Physical Exam  Constitutional: She is oriented to person, place, and time. She appears well-developed and well-nourished.  HENT:  Head: Atraumatic.  Eyes: EOM are normal.  Cardiovascular: Intact distal pulses.   Respiratory: Effort normal.  Musculoskeletal:  R shoulder pain with RC testing.  Neurological: She is alert and oriented to person, place, and time.  Skin: Skin is warm and dry.  Psychiatric: She has a normal mood and affect.     Assessment/Plan 65 yo female with years of worsening R shoulder pain with progression to full  thickness RCT Plan R arth RCR, SAD Risks / benefits of surgery discussed Consent on chart  NPO for OR Preop antibiotics   Nita Sells, MD 04/17/2016, 11:25 AM

## 2016-04-17 NOTE — Discharge Instructions (Signed)
Discharge Instructions after Arthroscopic Shoulder Repair ° ° °A sling has been provided for you. Remain in your sling at all times. This includes sleeping in your sling.  °Use ice on the shoulder intermittently over the first 48 hours after surgery.  °Pain medicine has been prescribed for you.  °Use your medicine liberally over the first 48 hours, and then you can begin to taper your use. You may take Extra Strength Tylenol or Tylenol only in place of the pain pills. DO NOT take ANY nonsteroidal anti-inflammatory pain medications: Advil, Motrin, Ibuprofen, Aleve, Naproxen, or Narprosyn.  °You may remove your dressing after two days. If the incision sites are still moist, place a Band-Aid over the moist site(s). Change Band-Aids daily until dry.  °You may shower 5 days after surgery. The incisions CANNOT get wet prior to 5 days. Simply allow the water to wash over the site and then pat dry. Do not rub the incisions. Make sure your axilla (armpit) is completely dry after showering.  °Take one aspirin a day for 2 weeks after surgery, unless you have an aspirin sensitivity/ allergy or asthma. ° ° °Please call [phone] during normal business hours or [phone] after hours for any problems. Including the following: ° °- excessive redness of the incisions °- drainage for more than 4 days °- fever of more than 101.5 F ° °*Please note that pain medications will not be refilled after hours or on weekends. ° ° °Post Anesthesia Home Care Instructions ° °Activity: °Get plenty of rest for the remainder of the day. A responsible adult should stay with you for 24 hours following the procedure.  °For the next 24 hours, DO NOT: °-Drive a car °-Operate machinery °-Drink alcoholic beverages °-Take any medication unless instructed by your physician °-Make any legal decisions or sign important papers. ° °Meals: °Start with liquid foods such as gelatin or soup. Progress to regular foods as tolerated. Avoid greasy, spicy, heavy  foods. If nausea and/or vomiting occur, drink only clear liquids until the nausea and/or vomiting subsides. Call your physician if vomiting continues. ° °Special Instructions/Symptoms: °Your throat may feel dry or sore from the anesthesia or the breathing tube placed in your throat during surgery. If this causes discomfort, gargle with warm salt water. The discomfort should disappear within 24 hours. ° °If you had a scopolamine patch placed behind your ear for the management of post- operative nausea and/or vomiting: ° °1. The medication in the patch is effective for 72 hours, after which it should be removed.  Wrap patch in a tissue and discard in the trash. Wash hands thoroughly with soap and water. °2. You may remove the patch earlier than 72 hours if you experience unpleasant side effects which may include dry mouth, dizziness or visual disturbances. °3. Avoid touching the patch. Wash your hands with soap and water after contact with the patch. °  °Regional Anesthesia Blocks ° °1. Numbness or the inability to move the "blocked" extremity may last from 3-48 hours after placement. The length of time depends on the medication injected and your individual response to the medication. If the numbness is not going away after 48 hours, call your surgeon. ° °2. The extremity that is blocked will need to be protected until the numbness is gone and the  Strength has returned. Because you cannot feel it, you will need to take extra care to avoid injury. Because it may be weak, you may have difficulty moving it or using it. You may not know   what position it is in without looking at it while the block is in effect. ° °3. For blocks in the legs and feet, returning to weight bearing and walking needs to be done carefully. You will need to wait until the numbness is entirely gone and the strength has returned. You should be able to move your leg and foot normally before you try and bear weight or walk. You will need someone to  be with you when you first try to ensure you do not fall and possibly risk injury. ° °4. Bruising and tenderness at the needle site are common side effects and will resolve in a few days. ° °5. Persistent numbness or new problems with movement should be communicated to the surgeon or the [facility] ([phone])/ [facility] ([phone]). ° ° ° °

## 2016-04-18 ENCOUNTER — Encounter (HOSPITAL_BASED_OUTPATIENT_CLINIC_OR_DEPARTMENT_OTHER): Payer: Self-pay | Admitting: Orthopedic Surgery

## 2018-04-22 ENCOUNTER — Encounter: Payer: Self-pay | Admitting: Allergy and Immunology

## 2018-04-22 ENCOUNTER — Ambulatory Visit (INDEPENDENT_AMBULATORY_CARE_PROVIDER_SITE_OTHER): Payer: Medicare Other | Admitting: Allergy and Immunology

## 2018-04-22 VITALS — BP 138/70 | HR 71 | Temp 98.6°F | Resp 16 | Ht 61.25 in | Wt 204.2 lb

## 2018-04-22 DIAGNOSIS — R053 Chronic cough: Secondary | ICD-10-CM

## 2018-04-22 DIAGNOSIS — K219 Gastro-esophageal reflux disease without esophagitis: Secondary | ICD-10-CM | POA: Insufficient documentation

## 2018-04-22 DIAGNOSIS — J3089 Other allergic rhinitis: Secondary | ICD-10-CM

## 2018-04-22 DIAGNOSIS — R05 Cough: Secondary | ICD-10-CM

## 2018-04-22 DIAGNOSIS — R062 Wheezing: Secondary | ICD-10-CM | POA: Diagnosis not present

## 2018-04-22 MED ORDER — RANITIDINE HCL 150 MG PO TABS: 150.0000 mg | ORAL_TABLET | Freq: Every day | ORAL | 1 refills | Status: DC

## 2018-04-22 MED ORDER — CARBINOXAMINE MALEATE 4 MG PO TABS: 4.0000 mg | ORAL_TABLET | Freq: Three times a day (TID) | ORAL | 1 refills | Status: DC | PRN

## 2018-04-22 MED ORDER — AZELASTINE HCL 0.1 % NA SOLN: 2.0000 | Freq: Two times a day (BID) | NASAL | 1 refills | Status: DC

## 2018-04-22 NOTE — Assessment & Plan Note (Signed)
   Aeroallergen avoidance measures have been discussed and provided in written form.  A prescription has been provided for carbinoxamine 4 mg every 8 hours if needed.  A prescription has been provided for azelastine nasal spray, 1-2 sprays per nostril 2 times daily as needed. Proper nasal spray technique has been discussed and demonstrated.   Nasal saline lavage (NeilMed) has been recommended as needed and prior to medicated nasal sprays along with instructions for proper administration.

## 2018-04-22 NOTE — Assessment & Plan Note (Signed)
   Appropriate reflux lifestyle modifications have been provided and discussed.  For now, continue Zegerid 40 mg, 30 minutes prior to breakfast.  A prescription has been provided for ranitidine 150 mg at bedtime.

## 2018-04-22 NOTE — Assessment & Plan Note (Addendum)
The most common causes of chronic cough include the following: upper airway cough syndrome (UACS) which is caused by variety of rhinosinus conditions; asthma; gastroesophageal reflux disease (GERD); chronic bronchitis from cigarette smoking or other inhaled environmental irritants; non-asthmatic eosinophilic bronchitis; and bronchiectasis. In prospective studies, these conditions have accounted for up to 94% of the causes of chronic cough in immunocompetent adults. The history and physical examination suggest that her cough is multifactorial with contribution from postnasal drainage and acid reflux. We will address these issues at this time.  Spirometry today revealed normal ventilatory function.  A prescription has been provided for a flutter valve to be used as needed to break the coughing cycle.  Treatment plan as outlined below.    We will regroup in 8 weeks to assess treatment response and adjust therapy accordingly.

## 2018-04-22 NOTE — Progress Notes (Signed)
New Patient Note  RE: Kathleen Duran MRN: [id number] DOB: [birth year] Date of Office Visit: 04/22/2018  Referring provider: Arlyss Repress, MD Primary care provider: Arlyss Repress, MD  Chief Complaint: Cough and Nasal Congestion   History of present illness: Kathleen Duran is a 67 y.o. female seen today in consultation requested by Arlyss Repress, MD.  She reports that since mid March 2019 she has been "coughing constantly."  Cough is typically nonproductive, however occasionally she is able to expectorate thick, clear mucus.  She experiences nasal congestion, rhinorrhea, and thick postnasal drainage.  No significant seasonal symptom variation has been noted nor have specific environmental triggers been identified.  She has a history of esophageal reflux.  She reports that her heartburn has improved while taking Zegerid, though she still experiences frequent heartburn.  She feels that her exercise capacity has decreased somewhat believes that she may have heard wheezing from her chest on a few occasions.  Assessment and plan: Cough, persistent The most common causes of chronic cough include the following: upper airway cough syndrome (UACS) which is caused by variety of rhinosinus conditions; asthma; gastroesophageal reflux disease (GERD); chronic bronchitis from cigarette smoking or other inhaled environmental irritants; non-asthmatic eosinophilic bronchitis; and bronchiectasis. In prospective studies, these conditions have accounted for up to 94% of the causes of chronic cough in immunocompetent adults. The history and physical examination suggest that her cough is multifactorial with contribution from postnasal drainage and acid reflux. We will address these issues at this time.  Spirometry today revealed normal ventilatory function.  A prescription has been provided for a flutter valve to be used as needed to break the coughing cycle.  Treatment plan as outlined below.    We  will regroup in 8 weeks to assess treatment response and adjust therapy accordingly.  Perennial allergic rhinitis with possible nonallergic component  Aeroallergen avoidance measures have been discussed and provided in written form.  A prescription has been provided for carbinoxamine 4 mg every 8 hours if needed.  A prescription has been provided for azelastine nasal spray, 1-2 sprays per nostril 2 times daily as needed. Proper nasal spray technique has been discussed and demonstrated.   Nasal saline lavage (NeilMed) has been recommended as needed and prior to medicated nasal sprays along with instructions for proper administration.  Laryngopharyngeal reflux  Appropriate reflux lifestyle modifications have been provided and discussed.  For now, continue Zegerid 40 mg, 30 minutes prior to breakfast.  A prescription has been provided for ranitidine 150 mg at bedtime.   Meds ordered this encounter  Medications  . Carbinoxamine Maleate 4 MG TABS    Sig: Take 1 tablet (4 mg total) by mouth every 8 (eight) hours as needed.    Dispense:  56 each    Refill:  1  . azelastine (ASTELIN) 0.1 % nasal spray    Sig: Place 2 sprays into both nostrils 2 (two) times daily.    Dispense:  30 mL    Refill:  1  . ranitidine (ZANTAC) 150 MG tablet    Sig: Take 1 tablet (150 mg total) by mouth at bedtime.    Dispense:  60 tablet    Refill:  1    Diagnostics: Spirometry: FVC was 2.72 L and FEV1 was 2.46 L (118% predicted) without postbronchodilator improvement.  This study was performed while the patient was asymptomatic.  Please see scanned spirometry results for details. Epicutaneous testing: Positive to molds. Intradermal testing: Negative.    Physical examination: Blood  pressure 138/70, pulse 71, temperature 98.6 F (37 C), temperature source Oral, resp. rate 16, height 5' 1.25" (1.556 m), weight 204 lb 3.2 oz (92.6 kg), SpO2 95 %.  General: Alert, interactive, in no acute distress. HEENT:  TMs pearly gray, turbinates moderately edematous with thick discharge, post-pharynx erythematous. Neck: Supple without lymphadenopathy. Lungs: Clear to auscultation without wheezing, rhonchi or rales. CV: Normal S1, S2 without murmurs. Abdomen: Nondistended, nontender. Skin: Warm and dry, without lesions or rashes. Extremities:  No clubbing, cyanosis or edema. Neuro:   Grossly intact.  Review of systems:  Review of systems negative except as noted in HPI / PMHx or noted below: Review of Systems  Constitutional: Negative.   HENT: Negative.   Eyes: Negative.   Respiratory: Negative.   Cardiovascular: Negative.   Gastrointestinal: Negative.   Genitourinary: Negative.   Musculoskeletal: Negative.   Skin: Negative.   Neurological: Negative.   Endo/Heme/Allergies: Negative.   Psychiatric/Behavioral: Negative.     Past medical history:  Past Medical History:  Diagnosis Date  . Arthritis   . Cancer [facility])    b cell leukemia  1999 , non hodkins lymphoma 2002  . GERD (gastroesophageal reflux disease)    borderline bleeding ulcer   . History of hiatal hernia   . Urticaria   . Vertigo     Past surgical history:  Past Surgical History:  Procedure Laterality Date  . ANTERIOR CERVICAL DECOMP/DISCECTOMY FUSION N/A 10/14/2015   Procedure: ANTERIOR CERVICAL DECOMPRESSION/DISCECTOMY FUSION 1 LEVEL;  Surgeon: Phylliss Bob, MD;  Location: [facility];  Service: Orthopedics;  Laterality: N/A;  Anterior cervical decompression fusion, cerivcal 4-5 with instrumentation and allograft  . CHOLECYSTECTOMY     2006  . HERNIA REPAIR     umbilical 0175  . SHOULDER ARTHROSCOPY WITH BICEPSTENOTOMY Right 04/17/2016   Procedure: SHOULDER ARTHROSCOPY WITH BICEPSTENOTOMY;  Surgeon: Tania Ade, MD;  Location: [facility];  Service: Orthopedics;  Laterality: Right;  . SHOULDER ARTHROSCOPY WITH ROTATOR CUFF REPAIR AND SUBACROMIAL DECOMPRESSION Right 04/17/2016   Procedure: SHOULDER ARTHROSCOPY  ROTATOR CUFF TEAR AND SUBACROMIAL DECOMPRESSION AND BICEPS TENOTOMY;  Surgeon: Tania Ade, MD;  Location: [facility];  Service: Orthopedics;  Laterality: Right;  RIGHT SHOULDER ARTHROSCOPY ROTATOR CUFF TEAR AND SUBACROMIAL DECOMPRESSION  . spleenectomy 1999    . strangulated umbilical hernia 1025    . TONSILLECTOMY     left tonsil    Family history: History reviewed. No pertinent family history.  Social history: Social History   Socioeconomic History  . Marital status: Married    Spouse name: Not on file  . Number of children: Not on file  . Years of education: Not on file  . Highest education level: Not on file  Occupational History  . Not on file  Social Needs  . Financial resource strain: Not on file  . Food insecurity:    Worry: Not on file    Inability: Not on file  . Transportation needs:    Medical: Not on file    Non-medical: Not on file  Tobacco Use  . Smoking status: Never Smoker  . Smokeless tobacco: Never Used  Substance and Sexual Activity  . Alcohol use: Yes    Comment: rarely  . Drug use: No  . Sexual activity: Yes    Birth control/protection: Post-menopausal  Lifestyle  . Physical activity:    Days per week: Not on file    Minutes per session: Not on file  . Stress: Not on file  Relationships  .  Social connections:    Talks on phone: Not on file    Gets together: Not on file    Attends religious service: Not on file    Active member of club or organization: Not on file    Attends meetings of clubs or organizations: Not on file    Relationship status: Not on file  . Intimate partner violence:    Fear of current or ex partner: Not on file    Emotionally abused: Not on file    Physically abused: Not on file    Forced sexual activity: Not on file  Other Topics Concern  . Not on file  Social History Narrative  . Not on file   Environmental History: The patient lives in a 68 year old apartment with carpeting throughout and  central air/heat.  There is no known mold/water damage in the home.  She is a non-smoker without pets.  Allergies as of 04/22/2018      Reactions   Cephalosporins Other (See Comments)   ALLERGY to PCN with immediate rash, facial/tongue/throat swelling, SOB, lightheadedness with hypotension.   Contrast Media [iodinated Diagnostic Agents] Hives, Shortness Of Breath   Penicillins Hives   Has patient had a PCN reaction causing immediate rash, facial/tongue/throat swelling, SOB or lightheadedness with hypotension: Yes Has patient had a PCN reaction causing severe rash involving mucus membranes or skin necrosis: No Has patient had a PCN reaction that required hospitalization No Has patient had a PCN reaction occurring within the last 10 years: No If all of the above answers are "NO", then may proceed with Cephalosporin use.   Amoxicillin Hives   Codeine Nausea And Vomiting   Keflex [cephalexin] Hives   Iodine Rash      Medication List        Accurate as of 04/22/18  7:25 PM. Always use your most recent med list.          aspirin 81 MG tablet Take 81 mg by mouth daily.   atorvastatin 40 MG tablet Commonly known as:  LIPITOR Take 40 mg by mouth at bedtime.   azelastine 0.1 % nasal spray Commonly known as:  ASTELIN Place 2 sprays into both nostrils 2 (two) times daily.   BIOTIN MAXIMUM STRENGTH 10 MG Tabs Generic drug:  Biotin Take 10 mg by mouth daily.   CALCIUM 600 + D PO Take 1 tablet by mouth daily.   Carbinoxamine Maleate 4 MG Tabs Take 1 tablet (4 mg total) by mouth every 8 (eight) hours as needed.   clobetasol cream 0.05 % Commonly known as:  TEMOVATE Apply 1 application topically daily as needed (rash).   docusate sodium 100 MG capsule Commonly known as:  COLACE Take 1 capsule (100 mg total) by mouth 3 (three) times daily as needed.   irbesartan 300 MG tablet Commonly known as:  AVAPRO Take 300 mg by mouth at bedtime.   metoprolol tartrate 50 MG  tablet Commonly known as:  LOPRESSOR Take 50 mg by mouth daily.   minoxidil 2 % external solution Commonly known as:  ROGAINE Apply 1 mL topically 1 day or 1 dose.   multivitamin with minerals Tabs tablet Take 1 tablet by mouth daily.   omeprazole-sodium bicarbonate 40-1100 MG capsule Commonly known as:  ZEGERID Take 1 capsule by mouth daily before breakfast.   oxyCODONE-acetaminophen 5-325 MG tablet Commonly known as:  ROXICET Take 1-2 tablets by mouth every 4 (four) hours as needed for severe pain.   ranitidine 150 MG tablet Commonly known  as:  ZANTAC Take 1 tablet (150 mg total) by mouth at bedtime.   vitamin C 500 MG tablet Commonly known as:  ASCORBIC ACID Take 1,000 mg by mouth daily.   vitamin E 400 UNIT capsule Take 400 Units by mouth daily.       Known medication allergies: Allergies  Allergen Reactions  . Cephalosporins Other (See Comments)    ALLERGY to PCN with immediate rash, facial/tongue/throat swelling, SOB, lightheadedness with hypotension.  . Contrast Media [Iodinated Diagnostic Agents] Hives and Shortness Of Breath  . Penicillins Hives    Has patient had a PCN reaction causing immediate rash, facial/tongue/throat swelling, SOB or lightheadedness with hypotension: Yes Has patient had a PCN reaction causing severe rash involving mucus membranes or skin necrosis: No Has patient had a PCN reaction that required hospitalization No Has patient had a PCN reaction occurring within the last 10 years: No If all of the above answers are "NO", then may proceed with Cephalosporin use.  Marland Kitchen Amoxicillin Hives  . Codeine Nausea And Vomiting  . Keflex [Cephalexin] Hives  . Iodine Rash    I appreciate the opportunity to take part in Evaleigh's care. Please do not hesitate to contact me with questions.  Sincerely,   R. Edgar Frisk, MD

## 2018-04-22 NOTE — Patient Instructions (Addendum)
Cough, persistent The most common causes of chronic cough include the following: upper airway cough syndrome (UACS) which is caused by variety of rhinosinus conditions; asthma; gastroesophageal reflux disease (GERD); chronic bronchitis from cigarette smoking or other inhaled environmental irritants; non-asthmatic eosinophilic bronchitis; and bronchiectasis. In prospective studies, these conditions have accounted for up to 94% of the causes of chronic cough in immunocompetent adults. The history and physical examination suggest that her cough is multifactorial with contribution from postnasal drainage and acid reflux. We will address these issues at this time.  Spirometry today revealed normal ventilatory function.  A prescription has been provided for a flutter valve to be used as needed to break the coughing cycle.  Treatment plan as outlined below.    We will regroup in 8 weeks to assess treatment response and adjust therapy accordingly.  Perennial allergic rhinitis with possible nonallergic component  Aeroallergen avoidance measures have been discussed and provided in written form.  A prescription has been provided for carbinoxamine 4 mg every 8 hours if needed.  A prescription has been provided for azelastine nasal spray, 1-2 sprays per nostril 2 times daily as needed. Proper nasal spray technique has been discussed and demonstrated.   Nasal saline lavage (NeilMed) has been recommended as needed and prior to medicated nasal sprays along with instructions for proper administration.  Laryngopharyngeal reflux  Appropriate reflux lifestyle modifications have been provided and discussed.  For now, continue Zegerid 40 mg, 30 minutes prior to breakfast.  A prescription has been provided for ranitidine 150 mg at bedtime.   Return in about 2 months (around 06/22/2018), or if symptoms worsen or fail to improve.  Control of Mold Allergen  Mold and fungi can grow on a variety of surfaces  provided certain temperature and moisture conditions exist.  Outdoor molds grow on plants, decaying vegetation and soil.  The major outdoor mold, Alternaria and Cladosporium, are found in very high numbers during hot and dry conditions.  Generally, a late Summer - Fall peak is seen for common outdoor fungal spores.  Rain will temporarily lower outdoor mold spore count, but counts rise rapidly when the rainy period ends.  The most important indoor molds are Aspergillus and Penicillium.  Dark, humid and poorly ventilated basements are ideal sites for mold growth.  The next most common sites of mold growth are the bathroom and the kitchen.  Outdoor [employer] 1. Use air conditioning and keep windows closed 2. Avoid exposure to decaying vegetation. 3. Avoid leaf raking. 4. Avoid grain handling. 5. Consider wearing a face mask if working in moldy areas.  Indoor Mold Control 1. Maintain humidity below 50%. 2. Clean washable surfaces with 5% bleach solution. 3. Remove sources e.g. Contaminated carpets.  Lifestyle Changes for Controlling GERD  When you have GERD, stomach acid feels as if it's backing up toward your mouth. Whether or not you take medication to control your GERD, your symptoms can often be improved with lifestyle changes.   Raise Your Head  Reflux is more likely to strike when you're lying down flat, because stomach fluid can  flow backward more easily. Raising the head of your bed 4-6 inches can help. To do this:  Slide blocks or books under the legs at the head of your bed. Or, place a wedge under  the mattress. Many foam stores can make a suitable wedge for you. The wedge  should run from your waist to the top of your head.  Don't just prop your head on several  pillows. This increases pressure on your  stomach. It can make GERD worse.  Watch Your Eating Habits Certain foods may increase the acid in your stomach or relax the lower esophageal sphincter, making GERD  more likely. It's best to avoid the following:  Coffee, tea, and carbonated drinks (with and without caffeine)  Fatty, fried, or spicy food  Mint, chocolate, onions, and tomatoes  Any other foods that seem to irritate your stomach or cause you pain  Relieve the Pressure  Eat smaller meals, even if you have to eat more often.  Don't lie down right after you eat. Wait a few hours for your stomach to empty.  Avoid tight belts and tight-fitting clothes.  Lose excess weight.  Tobacco and Alcohol  Avoid smoking tobacco and drinking alcohol. They can make GERD symptoms worse.

## 2018-04-29 ENCOUNTER — Telehealth: Payer: Self-pay

## 2018-04-29 MED ORDER — FLUTICASONE PROPIONATE 50 MCG/ACT NA SUSP: 2.0000 | Freq: Every day | NASAL | 5 refills | Status: DC

## 2018-04-29 NOTE — Telephone Encounter (Signed)
I am sending in prescription for fluticasone and patient has been instructed of medication recommendations.

## 2018-04-29 NOTE — Telephone Encounter (Signed)
Please have her continue azelastine nasal spray and nasal saline irrigation. Add fluticasone nasal spray, 2 sprays per nostril daily. For thick post nasal drainage, add guaifenesin [phone] mg (Mucinex)  twice daily as needed with adequate hydration as discussed. If the problem persists or progresses, we will provide low-dose prednisone if needed.

## 2018-04-29 NOTE — Telephone Encounter (Signed)
Patient was seen by Dr Verlin Fester on 04/22/18. Patient states her cold has not improved any. She is still so congested and coughing a lot. She can not taste or smell anything. It is keeping her up while trying to sleep.   [facility] [address]

## 2018-04-29 NOTE — Telephone Encounter (Signed)
I spoke with Kathleen Duran this morning and she had to stop taking the carbinoxamine 04-28-2018 due to recurring eye pressure/pain and headaches. She still has lots of post nasal drainage, despite using the azelastine. She is coughing up some mucus, but it is clear. Please advise on further instructions.

## 2018-04-30 ENCOUNTER — Other Ambulatory Visit: Payer: Self-pay | Admitting: Allergy and Immunology

## 2018-04-30 MED ORDER — FLUTICASONE PROPIONATE 50 MCG/ACT NA SUSP: 2.0000 | Freq: Every day | NASAL | 5 refills | Status: AC

## 2018-04-30 NOTE — Telephone Encounter (Signed)
Prescription has been sent to the corrected pharmacy.

## 2018-04-30 NOTE — Telephone Encounter (Signed)
Patient said she called yesterday about not being able to breathe out of her nose and a nose spray was called into a pharmacy for her. I looked and repeated the pharmacy to her that it was sent to, and it was the wrong one. She needs it sent to [facility] on [address] in [place]. There phone # is [phone].

## 2018-05-06 ENCOUNTER — Other Ambulatory Visit: Payer: Self-pay

## 2018-05-06 ENCOUNTER — Telehealth: Payer: Self-pay | Admitting: Allergy and Immunology

## 2018-05-06 MED ORDER — PREDNISONE 10 MG PO TABS: ORAL_TABLET | ORAL | 0 refills | Status: DC

## 2018-05-06 NOTE — Telephone Encounter (Signed)
Please provide a script for prednisone 20 mg x 4 days, 10 mg x1 day, then stop. Please have her use nasal saline spray/rinse, followed by azelastine nasal spray, 2 sprays per nostril twice daily, followed by fluticasone nasal spray 1 spray per nostril twice daily. If the problem persists or progresses despite the treatment plan as outlined above, she should be evaluated by an otolaryngologist (ear nose and throat doctor).  Thanks.

## 2018-05-06 NOTE — Telephone Encounter (Signed)
Informed pt of what to do per dr Verlin Fester and sent in rx for prednisone

## 2018-05-06 NOTE — Telephone Encounter (Signed)
Spoke with Kathleen Duran and said that one July 4th she cleaned out the humidifier in her room and it was full of black mold. She is not sure if this is related or not to the fact she is still having issues. She stated she is not able to smell or taste. She is having difficulty breathing thru her nose.  Kathleen Duran states she is really congestion do you believe she needs an antibiotic?  Please advise

## 2018-05-06 NOTE — Telephone Encounter (Signed)
Pt called and needs to talk with a nurse about Fluticasone it is not working. [facility]  On [address] in [place]. [phone].

## 2018-05-15 ENCOUNTER — Telehealth: Payer: Self-pay | Admitting: Allergy and Immunology

## 2018-05-15 NOTE — Telephone Encounter (Signed)
Per your last instructions: Please provide a script for prednisone 20 mg x 4 days, 10 mg x1 day, then stop. Please have her use nasal saline spray/rinse, followed by azelastine nasal spray, 2 sprays per nostril twice daily, followed by fluticasone nasal spray 1 spray per nostril twice daily. If the problem persists or progresses despite the treatment plan as outlined above, she should be evaluated by an otolaryngologist (ear nose and throat doctor).  Thanks.    Patient has done as instructed. Please advise and thank you.

## 2018-05-15 NOTE — Telephone Encounter (Signed)
Patient is still have problems breathing out of her nose, despite the plan she was put on. She would like to know if she needs to go to her primary.

## 2018-05-15 NOTE — Telephone Encounter (Signed)
Kathleen Duran will you please refer her to ENT? Thank You

## 2018-05-15 NOTE — Telephone Encounter (Signed)
Yes, patient needs to be referred to ENT for further evaluation and treatment for persistent nasal congestion. Thanks.

## 2018-05-20 NOTE — Telephone Encounter (Signed)
Referral has been placed in proficient to Dr Velvet Bathe office. They will contact the patient to schedule.

## 2018-06-17 ENCOUNTER — Ambulatory Visit: Payer: Medicare Other | Admitting: Allergy and Immunology

## 2020-01-23 ENCOUNTER — Inpatient Hospital Stay (HOSPITAL_COMMUNITY)
Admission: EM | Admit: 2020-01-23 | Discharge: 2020-02-05 | DRG: 871 | Disposition: A | Discharge status: Home Health | Payer: Medicare HMO | Attending: Internal Medicine | Admitting: Internal Medicine

## 2020-01-23 ENCOUNTER — Other Ambulatory Visit: Payer: Self-pay

## 2020-01-23 ENCOUNTER — Emergency Department (HOSPITAL_COMMUNITY): Payer: Medicare HMO

## 2020-01-23 ENCOUNTER — Encounter (HOSPITAL_COMMUNITY): Payer: Self-pay | Admitting: Emergency Medicine

## 2020-01-23 DIAGNOSIS — C9111 Chronic lymphocytic leukemia of B-cell type in remission: Secondary | ICD-10-CM | POA: Diagnosis present

## 2020-01-23 DIAGNOSIS — Z79899 Other long term (current) drug therapy: Secondary | ICD-10-CM

## 2020-01-23 DIAGNOSIS — R17 Unspecified jaundice: Secondary | ICD-10-CM | POA: Diagnosis present

## 2020-01-23 DIAGNOSIS — Z981 Arthrodesis status: Secondary | ICD-10-CM

## 2020-01-23 DIAGNOSIS — I444 Left anterior fascicular block: Secondary | ICD-10-CM | POA: Diagnosis present

## 2020-01-23 DIAGNOSIS — Z9049 Acquired absence of other specified parts of digestive tract: Secondary | ICD-10-CM

## 2020-01-23 DIAGNOSIS — I351 Nonrheumatic aortic (valve) insufficiency: Secondary | ICD-10-CM | POA: Diagnosis not present

## 2020-01-23 DIAGNOSIS — Z880 Allergy status to penicillin: Secondary | ICD-10-CM | POA: Diagnosis not present

## 2020-01-23 DIAGNOSIS — I352 Nonrheumatic aortic (valve) stenosis with insufficiency: Secondary | ICD-10-CM | POA: Diagnosis not present

## 2020-01-23 DIAGNOSIS — G9341 Metabolic encephalopathy: Secondary | ICD-10-CM | POA: Diagnosis present

## 2020-01-23 DIAGNOSIS — R569 Unspecified convulsions: Secondary | ICD-10-CM | POA: Diagnosis not present

## 2020-01-23 DIAGNOSIS — Z20822 Contact with and (suspected) exposure to covid-19: Secondary | ICD-10-CM | POA: Diagnosis present

## 2020-01-23 DIAGNOSIS — K219 Gastro-esophageal reflux disease without esophagitis: Secondary | ICD-10-CM | POA: Diagnosis present

## 2020-01-23 DIAGNOSIS — D696 Thrombocytopenia, unspecified: Secondary | ICD-10-CM | POA: Diagnosis present

## 2020-01-23 DIAGNOSIS — Z9081 Acquired absence of spleen: Secondary | ICD-10-CM

## 2020-01-23 DIAGNOSIS — R339 Retention of urine, unspecified: Secondary | ICD-10-CM | POA: Diagnosis not present

## 2020-01-23 DIAGNOSIS — R011 Cardiac murmur, unspecified: Secondary | ICD-10-CM | POA: Diagnosis not present

## 2020-01-23 DIAGNOSIS — I350 Nonrheumatic aortic (valve) stenosis: Secondary | ICD-10-CM | POA: Diagnosis present

## 2020-01-23 DIAGNOSIS — A403 Sepsis due to Streptococcus pneumoniae: Secondary | ICD-10-CM | POA: Diagnosis present

## 2020-01-23 DIAGNOSIS — Z856 Personal history of leukemia: Secondary | ICD-10-CM | POA: Diagnosis not present

## 2020-01-23 DIAGNOSIS — R634 Abnormal weight loss: Secondary | ICD-10-CM

## 2020-01-23 DIAGNOSIS — J324 Chronic pansinusitis: Secondary | ICD-10-CM | POA: Diagnosis present

## 2020-01-23 DIAGNOSIS — N179 Acute kidney failure, unspecified: Secondary | ICD-10-CM | POA: Diagnosis present

## 2020-01-23 DIAGNOSIS — R4182 Altered mental status, unspecified: Secondary | ICD-10-CM | POA: Diagnosis present

## 2020-01-23 DIAGNOSIS — E878 Other disorders of electrolyte and fluid balance, not elsewhere classified: Secondary | ICD-10-CM | POA: Diagnosis not present

## 2020-01-23 DIAGNOSIS — R000 Tachycardia, unspecified: Secondary | ICD-10-CM

## 2020-01-23 DIAGNOSIS — Z6833 Body mass index (BMI) 33.0-33.9, adult: Secondary | ICD-10-CM

## 2020-01-23 DIAGNOSIS — G9340 Encephalopathy, unspecified: Secondary | ICD-10-CM

## 2020-01-23 DIAGNOSIS — D72829 Elevated white blood cell count, unspecified: Secondary | ICD-10-CM | POA: Diagnosis not present

## 2020-01-23 DIAGNOSIS — R0902 Hypoxemia: Secondary | ICD-10-CM

## 2020-01-23 DIAGNOSIS — I110 Hypertensive heart disease with heart failure: Secondary | ICD-10-CM | POA: Diagnosis present

## 2020-01-23 DIAGNOSIS — G0490 Encephalitis and encephalomyelitis, unspecified: Secondary | ICD-10-CM | POA: Diagnosis present

## 2020-01-23 DIAGNOSIS — J8489 Other specified interstitial pulmonary diseases: Secondary | ICD-10-CM | POA: Diagnosis present

## 2020-01-23 DIAGNOSIS — R6520 Severe sepsis without septic shock: Secondary | ICD-10-CM | POA: Diagnosis present

## 2020-01-23 DIAGNOSIS — N2889 Other specified disorders of kidney and ureter: Secondary | ICD-10-CM | POA: Diagnosis present

## 2020-01-23 DIAGNOSIS — L89151 Pressure ulcer of sacral region, stage 1: Secondary | ICD-10-CM | POA: Diagnosis present

## 2020-01-23 DIAGNOSIS — B953 Streptococcus pneumoniae as the cause of diseases classified elsewhere: Secondary | ICD-10-CM | POA: Diagnosis not present

## 2020-01-23 DIAGNOSIS — D6489 Other specified anemias: Secondary | ICD-10-CM | POA: Diagnosis present

## 2020-01-23 DIAGNOSIS — E870 Hyperosmolality and hypernatremia: Secondary | ICD-10-CM | POA: Diagnosis not present

## 2020-01-23 DIAGNOSIS — M533 Sacrococcygeal disorders, not elsewhere classified: Secondary | ICD-10-CM

## 2020-01-23 DIAGNOSIS — I447 Left bundle-branch block, unspecified: Secondary | ICD-10-CM | POA: Diagnosis not present

## 2020-01-23 DIAGNOSIS — J13 Pneumonia due to Streptococcus pneumoniae: Secondary | ICD-10-CM | POA: Diagnosis present

## 2020-01-23 DIAGNOSIS — G001 Pneumococcal meningitis: Secondary | ICD-10-CM | POA: Diagnosis present

## 2020-01-23 DIAGNOSIS — Z91048 Other nonmedicinal substance allergy status: Secondary | ICD-10-CM | POA: Diagnosis not present

## 2020-01-23 DIAGNOSIS — R509 Fever, unspecified: Secondary | ICD-10-CM | POA: Diagnosis not present

## 2020-01-23 DIAGNOSIS — I10 Essential (primary) hypertension: Secondary | ICD-10-CM | POA: Diagnosis not present

## 2020-01-23 DIAGNOSIS — Z885 Allergy status to narcotic agent status: Secondary | ICD-10-CM

## 2020-01-23 DIAGNOSIS — E669 Obesity, unspecified: Secondary | ICD-10-CM | POA: Diagnosis present

## 2020-01-23 DIAGNOSIS — A409 Streptococcal sepsis, unspecified: Secondary | ICD-10-CM | POA: Diagnosis not present

## 2020-01-23 DIAGNOSIS — M25512 Pain in left shoulder: Secondary | ICD-10-CM | POA: Diagnosis present

## 2020-01-23 DIAGNOSIS — I5030 Unspecified diastolic (congestive) heart failure: Secondary | ICD-10-CM | POA: Diagnosis present

## 2020-01-23 DIAGNOSIS — R0602 Shortness of breath: Secondary | ICD-10-CM

## 2020-01-23 DIAGNOSIS — R7881 Bacteremia: Secondary | ICD-10-CM | POA: Diagnosis present

## 2020-01-23 DIAGNOSIS — R21 Rash and other nonspecific skin eruption: Secondary | ICD-10-CM | POA: Diagnosis not present

## 2020-01-23 DIAGNOSIS — Z91041 Radiographic dye allergy status: Secondary | ICD-10-CM | POA: Diagnosis not present

## 2020-01-23 DIAGNOSIS — Z9221 Personal history of antineoplastic chemotherapy: Secondary | ICD-10-CM

## 2020-01-23 DIAGNOSIS — E860 Dehydration: Secondary | ICD-10-CM | POA: Diagnosis present

## 2020-01-23 DIAGNOSIS — A419 Sepsis, unspecified organism: Secondary | ICD-10-CM | POA: Diagnosis present

## 2020-01-23 DIAGNOSIS — J849 Interstitial pulmonary disease, unspecified: Secondary | ICD-10-CM | POA: Diagnosis not present

## 2020-01-23 DIAGNOSIS — Z881 Allergy status to other antibiotic agents status: Secondary | ICD-10-CM

## 2020-01-23 DIAGNOSIS — R0603 Acute respiratory distress: Secondary | ICD-10-CM | POA: Diagnosis not present

## 2020-01-23 DIAGNOSIS — E871 Hypo-osmolality and hyponatremia: Secondary | ICD-10-CM | POA: Diagnosis not present

## 2020-01-23 DIAGNOSIS — A491 Streptococcal infection, unspecified site: Secondary | ICD-10-CM | POA: Diagnosis not present

## 2020-01-23 DIAGNOSIS — Z7982 Long term (current) use of aspirin: Secondary | ICD-10-CM

## 2020-01-23 DIAGNOSIS — R410 Disorientation, unspecified: Secondary | ICD-10-CM | POA: Diagnosis not present

## 2020-01-23 DIAGNOSIS — R918 Other nonspecific abnormal finding of lung field: Secondary | ICD-10-CM | POA: Diagnosis not present

## 2020-01-23 DIAGNOSIS — Z888 Allergy status to other drugs, medicaments and biological substances status: Secondary | ICD-10-CM

## 2020-01-23 DIAGNOSIS — G039 Meningitis, unspecified: Secondary | ICD-10-CM | POA: Diagnosis not present

## 2020-01-23 LAB — COMPREHENSIVE METABOLIC PANEL
ALT: 240 U/L — ABNORMAL HIGH (ref 0–44)
AST: 161 U/L — ABNORMAL HIGH (ref 15–41)
Albumin: 3.4 g/dL — ABNORMAL LOW (ref 3.5–5.0)
Alkaline Phosphatase: 120 U/L (ref 38–126)
Anion gap: 17 — ABNORMAL HIGH (ref 5–15)
BUN: 53 mg/dL — ABNORMAL HIGH (ref 8–23)
CO2: 18 mmol/L — ABNORMAL LOW (ref 22–32)
Calcium: 8.8 mg/dL — ABNORMAL LOW (ref 8.9–10.3)
Chloride: 107 mmol/L (ref 98–111)
Creatinine, Ser: 3.09 mg/dL — ABNORMAL HIGH (ref 0.44–1.00)
GFR calc Af Amer: 17 mL/min — ABNORMAL LOW (ref >60)
GFR calc non Af Amer: 15 mL/min — ABNORMAL LOW (ref >60)
Glucose, Bld: 150 mg/dL — ABNORMAL HIGH (ref 70–99)
Potassium: 4 mmol/L (ref 3.5–5.1)
Sodium: 142 mmol/L (ref 135–145)
Total Bilirubin: 0.7 mg/dL (ref 0.3–1.2)
Total Protein: 6.4 g/dL — ABNORMAL LOW (ref 6.5–8.1)

## 2020-01-23 LAB — I-STAT CHEM 8, ED
BUN: 47 mg/dL — ABNORMAL HIGH (ref 8–23)
Calcium, Ion: 1.05 mmol/L — ABNORMAL LOW (ref 1.15–1.40)
Chloride: 109 mmol/L (ref 98–111)
Creatinine, Ser: 3.2 mg/dL — ABNORMAL HIGH (ref 0.44–1.00)
Glucose, Bld: 147 mg/dL — ABNORMAL HIGH (ref 70–99)
HCT: 38 % (ref 36.0–46.0)
Hemoglobin: 12.9 g/dL (ref 12.0–15.0)
Potassium: 3.5 mmol/L (ref 3.5–5.1)
Sodium: 140 mmol/L (ref 135–145)
TCO2: 19 mmol/L — ABNORMAL LOW (ref 22–32)

## 2020-01-23 LAB — URINALYSIS, ROUTINE W REFLEX MICROSCOPIC
Bilirubin Urine: NEGATIVE
Glucose, UA: NEGATIVE mg/dL
Hgb urine dipstick: NEGATIVE
Ketones, ur: NEGATIVE mg/dL
Nitrite: NEGATIVE
Protein, ur: 100 mg/dL — AB
Specific Gravity, Urine: 1.018 (ref 1.005–1.030)
pH: 5 (ref 5.0–8.0)

## 2020-01-23 LAB — CBC WITH DIFFERENTIAL/PLATELET
Abs Immature Granulocytes: 3.51 10*3/uL — ABNORMAL HIGH (ref 0.00–0.07)
Basophils Absolute: 0.3 10*3/uL — ABNORMAL HIGH (ref 0.0–0.1)
Basophils Relative: 0 %
Eosinophils Absolute: 0 10*3/uL (ref 0.0–0.5)
Eosinophils Relative: 0 %
HCT: 37.5 % (ref 36.0–46.0)
Hemoglobin: 12.3 g/dL (ref 12.0–15.0)
Immature Granulocytes: 4 %
Lymphocytes Relative: 2 %
Lymphs Abs: 1.4 10*3/uL (ref 0.7–4.0)
MCH: 27.3 pg (ref 26.0–34.0)
MCHC: 32.8 g/dL (ref 30.0–36.0)
MCV: 83.1 fL (ref 80.0–100.0)
Monocytes Absolute: 9.3 10*3/uL — ABNORMAL HIGH (ref 0.1–1.0)
Monocytes Relative: 12 %
Neutro Abs: 64.4 10*3/uL — ABNORMAL HIGH (ref 1.7–7.7)
Neutrophils Relative %: 82 %
Platelets: 172 10*3/uL (ref 150–400)
RBC: 4.51 MIL/uL (ref 3.87–5.11)
RDW: 16.3 % — ABNORMAL HIGH (ref 11.5–15.5)
WBC: 78.9 10*3/uL (ref 4.0–10.5)
nRBC: 0 % (ref 0.0–0.2)

## 2020-01-23 LAB — PROTIME-INR
INR: 1.3 — ABNORMAL HIGH (ref 0.8–1.2)
Prothrombin Time: 16.5 seconds — ABNORMAL HIGH (ref 11.4–15.2)

## 2020-01-23 LAB — LIPASE, BLOOD: Lipase: 13 U/L (ref 11–51)

## 2020-01-23 LAB — APTT: aPTT: 26 seconds (ref 24–36)

## 2020-01-23 LAB — LACTIC ACID, PLASMA: Lactic Acid, Venous: 1.6 mmol/L (ref 0.5–1.9)

## 2020-01-23 MED ORDER — SODIUM CHLORIDE 0.9 % IV SOLN
1.0000 g | Freq: Three times a day (TID) | INTRAVENOUS | Status: DC
  Filled 2020-01-23 (×2): qty 1

## 2020-01-23 MED ORDER — SODIUM CHLORIDE 0.9 % IV SOLN
2.0000 g | Freq: Three times a day (TID) | INTRAVENOUS | Status: DC
  Filled 2020-01-23 (×2): qty 2

## 2020-01-23 MED ORDER — VANCOMYCIN HCL 1750 MG/350ML IV SOLN
1750.0000 mg | Freq: Once | INTRAVENOUS | Status: AC
  Administered 2020-01-23: 1750 mg via INTRAVENOUS
  Filled 2020-01-23: qty 350

## 2020-01-23 MED ORDER — MIDAZOLAM HCL 2 MG/2ML IJ SOLN
INTRAMUSCULAR | Status: AC
  Administered 2020-01-23: 23:00:00 2 mg
  Filled 2020-01-23: qty 2

## 2020-01-23 MED ORDER — SODIUM CHLORIDE 0.9 % IV BOLUS
1000.0000 mL | Freq: Once | INTRAVENOUS | Status: AC
  Administered 2020-01-23: 22:00:00 1000 mL via INTRAVENOUS

## 2020-01-23 MED ORDER — MIDAZOLAM HCL 2 MG/2ML IJ SOLN: 2.0000 mg | Freq: Once | INTRAMUSCULAR | Status: DC

## 2020-01-23 MED ORDER — ONDANSETRON HCL 4 MG/2ML IJ SOLN
4.0000 mg | Freq: Once | INTRAMUSCULAR | Status: AC
  Administered 2020-01-23: 21:00:00 4 mg via INTRAVENOUS
  Filled 2020-01-23: qty 2

## 2020-01-23 MED ORDER — SODIUM CHLORIDE 0.9 % IV SOLN
1.0000 g | Freq: Two times a day (BID) | INTRAVENOUS | Status: DC
  Administered 2020-01-23 – 2020-01-24 (×3): 1 g via INTRAVENOUS
  Filled 2020-01-23 (×5): qty 1

## 2020-01-23 MED ORDER — VANCOMYCIN VARIABLE DOSE PER UNSTABLE RENAL FUNCTION (PHARMACIST DOSING): Status: DC

## 2020-01-23 NOTE — Progress Notes (Signed)
Pharmacy Antibiotic Note  Kathleen Duran is a 69 y.o. female admitted on 01/23/2020 with sepsis and meningitis concerns.  Pharmacy has been consulted for Meropenem and Vancomycin dosing.      No data recorded.  Recent Labs  Lab 01/23/20 2039  CREATININE 3.20*    CrCl cannot be calculated (Unknown ideal weight.).    Allergies  Allergen Reactions  . Cephalosporins Other (See Comments)    ALLERGY to PCN with immediate rash, facial/tongue/throat swelling, SOB, lightheadedness with hypotension.  . Contrast Media [Iodinated Diagnostic Agents] Hives and Shortness Of Breath  . Penicillins Hives    Has patient had a PCN reaction causing immediate rash, facial/tongue/throat swelling, SOB or lightheadedness with hypotension: Yes Has patient had a PCN reaction causing severe rash involving mucus membranes or skin necrosis: No Has patient had a PCN reaction that required hospitalization No Has patient had a PCN reaction occurring within the last 10 years: No If all of the above answers are "NO", then may proceed with Cephalosporin use.  Marland Kitchen Amoxicillin Hives  . Codeine Nausea And Vomiting  . Keflex [Cephalexin] Hives  . Iodine Rash    Antimicrobials this admission: 3/26 Meropenem >>  3/26 Vancomycin >>   Dose adjustments this admission: N/a  Microbiology results: Pending   Plan:  - Meropenem 1g IV q12h - Vancomycin 1750mg  IV x 1 dose  - Will dose vancomycin by random as it appears the patient has a AKI  - With concern will not use AUC dosing and will aim for a Vancomycin trough goal of 15-20 mg/dl  - Monitor patients renal function and urine output  - De-escalate ABX when appropriate   Thank you for allowing pharmacy to be a part of this patient's care.  Duanne Limerick PharmD. BCPS 01/23/2020 8:42 PM

## 2020-01-23 NOTE — ED Notes (Signed)
Provider informed of elevated WBC.

## 2020-01-23 NOTE — ED Triage Notes (Signed)
Pt coming from home with EMS after developing stroke symptoms Wednesday evening @ 2100. Did not want family to call anyone for her. Woke up around 0800 Thursday morning with worsening symptoms. Noted to have a L sided facial droop, altered, and unable to follow commands. Pt appears jaundice. Hx of lymphoma and TIA. HR 80, and all vitals WDL

## 2020-01-23 NOTE — ED Provider Notes (Signed)
[facility] EMERGENCY DEPARTMENT Provider Note   CSN: [id number] Arrival date & time: 01/23/20  2004     History No chief complaint on file.   Kathleen Duran is a 69 y.o. female.  Patient is a 69 year old female who presents with altered mental status.  She has a history of B-cell leukemia in remission, hypertension and GERD.  Her husband states she has been sick since yesterday.  She woke up yesterday morning not feeling well and was running a fever.  She slept most of the day.  She has had a couple episodes of vomiting.  Today she was less responsive and more confused than yesterday.  There was some reports of some left-sided weakness that also was noted to start yesterday.  She has not had any known diarrhea.  No coughing.  She was complaining of pain in her back and shoulders.  History is limited due to her altered mental status.        Past Medical History:  Diagnosis Date  . Arthritis   . Cancer [facility])    b cell leukemia  1999 , non hodkins lymphoma 2002  . GERD (gastroesophageal reflux disease)    borderline bleeding ulcer   . History of hiatal hernia   . Urticaria   . Vertigo     Patient Active Problem List   Diagnosis Date Noted  . Sepsis ([place]) 01/23/2020  . Cough, persistent 04/22/2018  . Wheezing 04/22/2018  . Perennial allergic rhinitis with possible nonallergic component 04/22/2018  . Laryngopharyngeal reflux 04/22/2018  . Radiculopathy 10/14/2015    Past Surgical History:  Procedure Laterality Date  . ANTERIOR CERVICAL DECOMP/DISCECTOMY FUSION N/A 10/14/2015   Procedure: ANTERIOR CERVICAL DECOMPRESSION/DISCECTOMY FUSION 1 LEVEL;  Surgeon: Phylliss Bob, MD;  Location: [place];  Service: Orthopedics;  Laterality: N/A;  Anterior cervical decompression fusion, cerivcal 4-5 with instrumentation and allograft  . CHOLECYSTECTOMY     2006  . HERNIA REPAIR     umbilical [id number]  . SHOULDER ARTHROSCOPY WITH BICEPSTENOTOMY Right 04/17/2016   Procedure: SHOULDER ARTHROSCOPY WITH BICEPSTENOTOMY;  Surgeon: Tania Ade, MD;  Location: [place];  Service: Orthopedics;  Laterality: Right;  . SHOULDER ARTHROSCOPY WITH ROTATOR CUFF REPAIR AND SUBACROMIAL DECOMPRESSION Right 04/17/2016   Procedure: SHOULDER ARTHROSCOPY ROTATOR CUFF TEAR AND SUBACROMIAL DECOMPRESSION AND BICEPS TENOTOMY;  Surgeon: Tania Ade, MD;  Location: [place];  Service: Orthopedics;  Laterality: Right;  RIGHT SHOULDER ARTHROSCOPY ROTATOR CUFF TEAR AND SUBACROMIAL DECOMPRESSION  . spleenectomy 1999    . strangulated umbilical hernia [id number]    . TONSILLECTOMY     left tonsil     OB History   No obstetric history on file.     No family history on file.  Social History   Tobacco Use  . Smoking status: Never Smoker  . Smokeless tobacco: Never Used  Substance Use Topics  . Alcohol use: Yes    Comment: rarely  . Drug use: No    Home Medications Prior to Admission medications   Medication Sig Start Date End Date Taking? Authorizing Provider  aspirin 81 MG tablet Take 81 mg by mouth daily.    [provider]  atorvastatin (LIPITOR) 40 MG tablet Take 40 mg by mouth at bedtime.    [provider]  azelastine (ASTELIN) 0.1 % nasal spray Place 2 sprays into both nostrils 2 (two) times daily. 04/22/18   Bobbitt, Sedalia Muta, MD  Biotin (BIOTIN MAXIMUM STRENGTH) 10 MG TABS Take 10  mg by mouth daily.    [provider]  Calcium Carb-Cholecalciferol (CALCIUM 600 + D PO) Take 1 tablet by mouth daily.    [provider]  Carbinoxamine Maleate 4 MG TABS Take 1 tablet (4 mg total) by mouth every 8 (eight) hours as needed. 04/22/18   Bobbitt, Sedalia Muta, MD  clobetasol cream (TEMOVATE) [id number] % Apply 1 application topically daily as needed (rash).    [provider]  docusate sodium (COLACE) 100 MG capsule Take 1 capsule (100 mg total) by mouth 3 (three) times daily as needed. Patient not  taking: Reported on 04/22/2018 04/17/16   Grier Mitts, PA-C  fluticasone [facility]) 50 MCG/ACT nasal spray Place 2 sprays into both nostrils daily. 04/30/18   Bobbitt, Sedalia Muta, MD  irbesartan (AVAPRO) 300 MG tablet Take 300 mg by mouth at bedtime.    [provider]  metoprolol (LOPRESSOR) 50 MG tablet Take 50 mg by mouth daily.    [provider]  minoxidil (ROGAINE) 2 % external solution Apply 1 mL topically 1 day or 1 dose.    [provider]  Multiple Vitamin (MULTIVITAMIN WITH MINERALS) TABS tablet Take 1 tablet by mouth daily.    [provider]  omeprazole-sodium bicarbonate (ZEGERID) 40-1100 MG capsule Take 1 capsule by mouth daily before breakfast.    [provider]  oxyCODONE-acetaminophen (ROXICET) 5-325 MG tablet Take 1-2 tablets by mouth every 4 (four) hours as needed for severe pain. Patient not taking: Reported on 04/22/2018 04/17/16   Grier Mitts, PA-C  predniSONE (DELTASONE) 10 MG tablet 2 tablets daily for 4 days then 1 tablet on day 5 then STOP! 05/06/18   Bobbitt, Sedalia Muta, MD  ranitidine (ZANTAC) 150 MG tablet Take 1 tablet (150 mg total) by mouth at bedtime. 04/22/18   Bobbitt, Sedalia Muta, MD  vitamin C (ASCORBIC ACID) 500 MG tablet Take 1,000 mg by mouth daily.    [provider]  vitamin E 400 UNIT capsule Take 400 Units by mouth daily.    [provider]    Allergies    Cephalosporins, Contrast media [iodinated diagnostic agents], Penicillins, Amoxicillin, Codeine, Keflex [cephalexin], and Iodine  Review of Systems   Review of Systems  Unable to perform ROS: Mental status change    Physical Exam Updated Vital Signs BP (!) 174/75   Pulse 100   Temp (!) 101 F (38.3 C) (Rectal)   Resp 19   SpO2 97%   Physical Exam Constitutional:      Appearance: She is well-developed. She is ill-appearing.  HENT:     Head: Normocephalic and atraumatic.  Eyes:     Pupils: Pupils are equal,  round, and reactive to light.  Cardiovascular:     Rate and Rhythm: Normal rate and regular rhythm.     Heart sounds: Normal heart sounds.  Pulmonary:     Effort: Pulmonary effort is normal. No respiratory distress.     Breath sounds: Normal breath sounds. No wheezing or rales.  Chest:     Chest wall: No tenderness.  Abdominal:     General: Bowel sounds are normal.     Palpations: Abdomen is soft.     Tenderness: There is no abdominal tenderness. There is no guarding or rebound.  Musculoskeletal:     Cervical back: Rigidity present.  Lymphadenopathy:     Cervical: No cervical adenopathy.  Skin:    General: Skin is warm.     Findings: No rash.     Comments:  Clammy, skin is yellowed although has been says this is probably from her tanning lotions.  She also has some lacy purpura on her lower extremities.  Neurological:     Comments: She will tell me her name but will answer other questions.  She seems to be moving all extremities symmetrically without focal deficits     ED Results / Procedures / Treatments   Labs (all labs ordered are listed, but only abnormal results are displayed) Labs Reviewed  COMPREHENSIVE METABOLIC PANEL - Abnormal; Notable for the following components:      Result Value   CO2 18 (*)    Glucose, Bld 150 (*)    BUN 53 (*)    Creatinine, Ser 3.09 (*)    Calcium 8.8 (*)    Total Protein 6.4 (*)    Albumin 3.4 (*)    AST 161 (*)    ALT 240 (*)    GFR calc non Af Amer 15 (*)    GFR calc Af Amer 17 (*)    Anion gap 17 (*)    All other components within normal limits  CBC WITH DIFFERENTIAL/PLATELET - Abnormal; Notable for the following components:   WBC 78.9 (*)    RDW 16.3 (*)    Neutro Abs 64.4 (*)    Monocytes Absolute 9.3 (*)    Basophils Absolute 0.3 (*)    Abs Immature Granulocytes 3.51 (*)    All other components within normal limits  PROTIME-INR - Abnormal; Notable for the following components:   Prothrombin Time 16.5 (*)    INR 1.3 (*)     All other components within normal limits  URINALYSIS, ROUTINE W REFLEX MICROSCOPIC - Abnormal; Notable for the following components:   Color, Urine AMBER (*)    APPearance CLOUDY (*)    Protein, ur 100 (*)    Leukocytes,Ua TRACE (*)    Bacteria, UA RARE (*)    All other components within normal limits  I-STAT CHEM 8, ED - Abnormal; Notable for the following components:   BUN 47 (*)    Creatinine, Ser 3.20 (*)    Glucose, Bld 147 (*)    Calcium, Ion 1.05 (*)    TCO2 19 (*)    All other components within normal limits  CULTURE, BLOOD (ROUTINE X 2)  CULTURE, BLOOD (ROUTINE X 2)  URINE CULTURE  SARS CORONAVIRUS 2 (TAT 6-24 HRS)  LACTIC ACID, PLASMA  APTT  LIPASE, BLOOD  LACTIC ACID, PLASMA  COMPREHENSIVE METABOLIC PANEL  PATHOLOGIST SMEAR REVIEW    EKG EKG Interpretation  Date/Time:  Friday January 23 2020 21:03:26 EDT Ventricular Rate:  89 PR Interval:    QRS Duration: 159 QT Interval:  394 QTC Calculation: 480 R Axis:   -44 Text Interpretation: Sinus rhythm Probable left atrial enlargement Left bundle branch block similar to EKG from same say Confirmed by Malvin Johns [phone]) on 01/23/2020 9:15:17 PM   Radiology CT Abdomen Pelvis Wo Contrast  Result Date: 01/23/2020 CLINICAL DATA:  Patient appears jaundiced history of lymphoma EXAM: CT ABDOMEN AND PELVIS WITHOUT CONTRAST TECHNIQUE: Multidetector CT imaging of the abdomen and pelvis was performed following the standard protocol without IV contrast. COMPARISON:  None. FINDINGS: Lower chest: The visualized heart size within normal limits. No pericardial fluid/thickening. No hiatal hernia. The visualized portions of the lungs are clear. Hepatobiliary: Although limited due to the lack of intravenous contrast, normal in appearance without gross focal abnormality. The patient is status post cholecystectomy. No biliary ductal dilation. Pancreas:  Unremarkable.  No surrounding inflammatory changes. Spleen: The patient is status post  splenectomy with surgical clips in the left upper quadrant. Adrenals/Urinary Tract: Both adrenal glands appear normal. The kidneys and collecting system appear normal without evidence of urinary tract calculus or hydronephrosis. Bladder is unremarkable. Stomach/Bowel: The stomach, small bowel, and colon are normal in appearance. No inflammatory changes or obstructive findings. Scattered colonic diverticula are noted without diverticulitis. Appendix is normal. Vascular/Lymphatic: There are no enlarged abdominal or pelvic lymph nodes. Scattered aortic atherosclerotic calcifications are seen without aneurysmal dilatation. Reproductive: The uterus and adnexa are unremarkable. Other: No evidence of abdominal wall mass or hernia. Musculoskeletal: No acute or significant osseous findings. IMPRESSION: No acute intra-abdominal or pelvic pathology to explain the patient's symptoms. Diverticulosis without diverticulitis. Aortic Atherosclerosis (ICD10-I70.0). Electronically Signed   By: Prudencio Pair M.D.   On: 01/23/2020 22:03   CT Head Wo Contrast  Result Date: 01/23/2020 CLINICAL DATA:  Altered mental status. Left facial droop. EXAM: CT HEAD WITHOUT CONTRAST TECHNIQUE: Contiguous axial images were obtained from the base of the skull through the vertex without intravenous contrast. COMPARISON:  Brain MRI 11/24/2019 FINDINGS: Brain: No acute hemorrhage. No extra-axial or subdural collection. Mild generalized atrophy with moderate periventricular and deep white matter hypodensity consistent with chronic small vessel ischemia. This appears slightly asymmetric in the right cerebral hemisphere but no definite cortical infarct. Small calcification in left basal ganglia, likely senescent. No midline shift, hydrocephalus or suspicious mass effect. Vascular: Atherosclerosis of skullbase vasculature without hyperdense vessel or abnormal calcification. Skull: No fracture or focal lesion. Sinuses/Orbits: Chronic paranasal sinus  disease with near complete opacification of frontal sinuses, ethmoid air cells, left sphenoid and maxillary sinus. Mucosal thickening in the right sphenoid and maxillary sinus. The degree sinus opacification has slightly progressed from prior MRI. Mastoid air cells are clear. Other: None. IMPRESSION: 1. No hemorrhage or evidence of acute infarct. 2. Mild atrophy with moderate chronic small vessel ischemia. 3. Chronic paranasal sinus disease, slightly progressed from January MRI. Electronically Signed   By: Keith Rake M.D.   On: 01/23/2020 22:02   DG Chest Port 1 View  Result Date: 01/23/2020 CLINICAL DATA:  69 year old female with sepsis. EXAM: PORTABLE CHEST 1 VIEW COMPARISON:  Chest radiographs 12/19/2018 and earlier. FINDINGS: Portable AP semi upright view at 2045 hours. Larger lung volumes. Cardiac size at the upper limits of normal. Calcified aortic atherosclerosis. Other mediastinal contours are within normal limits. Visualized tracheal air column is within normal limits. Allowing for portable technique the lungs are clear. Chronic left upper quadrant surgical clips and cervical ACDF. Negative visible bowel gas pattern. No acute osseous abnormality identified. IMPRESSION: No acute cardiopulmonary abnormality. Aortic Atherosclerosis (ICD10-I70.0). Electronically Signed   By: Genevie Ann M.D.   On: 01/23/2020 20:55    Procedures .Lumbar Puncture  Date/Time: 01/23/2020 11:15 PM Performed by: Malvin Johns, MD Authorized by: Malvin Johns, MD   Consent:    Consent obtained:  Emergent situation (discussed verbally with husband)   Consent given by:  Spouse Universal protocol:    Procedure explained and questions answered to patient or proxy's satisfaction: yes     Relevant documents present and verified: yes     Test results available and properly labeled: yes     Imaging studies available: yes     Required blood products, implants, devices, and special equipment available: yes      Immediately prior to procedure a time out was called: yes     Site/side marked: no  Patient identity confirmed:  Arm band Pre-procedure details:    Procedure purpose:  Diagnostic   Preparation: Patient was prepped and draped in usual sterile fashion   Sedation:    Sedation type:  Anxiolysis Anesthesia (see MAR for exact dosages):    Anesthesia method:  Local infiltration   Local anesthetic:  Lidocaine 1% w/o epi Procedure details:    Lumbar space:  L4-L5 interspace   Patient position:  L lateral decubitus   Needle gauge:  20   Needle type:  Diamond point   Needle length (in):  2.5   Number of attempts:  3 Comments:     Procedure was unsuccessful   (including critical care time)  Medications Ordered in ED Medications  meropenem (MERREM) 1 g in sodium chloride 0.9 % 100 mL IVPB (1 g Intravenous New Bag/Given 01/23/20 2220)  vancomycin variable dose per unstable renal function (pharmacist dosing) (has no administration in time range)  midazolam (VERSED) injection 2 mg (2 mg Intravenous Not Given 01/23/20 2313)  ondansetron (ZOFRAN) injection 4 mg (4 mg Intravenous Given 01/23/20 2041)  vancomycin (VANCOREADY) IVPB 1750 mg/350 mL (1,750 mg Intravenous New Bag/Given 01/23/20 2120)  sodium chloride 0.9 % bolus 1,000 mL (1,000 mLs Intravenous New Bag/Given 01/23/20 2130)  midazolam (VERSED) 2 MG/2ML injection (2 mg  Given 01/23/20 2250)    ED Course  I have reviewed the triage vital signs and the nursing notes.  Pertinent labs & imaging results that were available during my care of the patient were reviewed by me and considered in my medical decision making (see chart for details).    MDM Rules/Calculators/A&P                      Patient is a 69 year old female who presents with altered mental status with a decline since yesterday.  She is febrile on arrival.  She was treated for sepsis.  There is a concern for possible meningitis as she has neck rigidity.  Her white count is  markedly elevated and could represent a recurrence of her leukemia.  She had antibiotics given for possible meningitis/sepsis.  She was started on IV fluids.  LP was attempted but was unsuccessful.  She had a head CT which shows no acute abnormality.  Chest x-ray shows no acute abnormality.  Will likely need LP under IR guidance.   Her labs show an acute kidney injury.  She also has some elevated LFTs.  CT scan of her abdomen pelvis showed no acute abnormalities.  I spoke with Dr. Marin Olp with hematology regarding her elevated WBC count.  He felt with the predominant neutrophils in association with a normal hemoglobin and normal platelet count, that this is more likely a reactive process versus leukemia recurrence.  He will consult on the patient and look at a smear in the morning.  I spoke with Dr. Hal Hope who will admit the patient for further treatment. Final Clinical Impression(s) / ED Diagnoses Final diagnoses:  Sepsis, due to unspecified organism, unspecified whether acute organ dysfunction present [facility])  AKI (acute kidney injury) [facility])    Rx / DC Orders ED Discharge Orders    None       Malvin Johns, MD 01/23/20 2341

## 2020-01-24 ENCOUNTER — Inpatient Hospital Stay (HOSPITAL_COMMUNITY): Payer: Medicare HMO

## 2020-01-24 ENCOUNTER — Encounter (HOSPITAL_COMMUNITY): Payer: Self-pay | Admitting: Internal Medicine

## 2020-01-24 DIAGNOSIS — I10 Essential (primary) hypertension: Secondary | ICD-10-CM | POA: Diagnosis present

## 2020-01-24 DIAGNOSIS — G039 Meningitis, unspecified: Secondary | ICD-10-CM | POA: Diagnosis present

## 2020-01-24 DIAGNOSIS — R569 Unspecified convulsions: Secondary | ICD-10-CM

## 2020-01-24 DIAGNOSIS — A419 Sepsis, unspecified organism: Secondary | ICD-10-CM

## 2020-01-24 DIAGNOSIS — A409 Streptococcal sepsis, unspecified: Secondary | ICD-10-CM

## 2020-01-24 DIAGNOSIS — A491 Streptococcal infection, unspecified site: Secondary | ICD-10-CM

## 2020-01-24 DIAGNOSIS — N179 Acute kidney failure, unspecified: Secondary | ICD-10-CM | POA: Diagnosis present

## 2020-01-24 DIAGNOSIS — G9340 Encephalopathy, unspecified: Secondary | ICD-10-CM

## 2020-01-24 DIAGNOSIS — R6520 Severe sepsis without septic shock: Secondary | ICD-10-CM

## 2020-01-24 LAB — CBG MONITORING, ED
Glucose-Capillary: 148 mg/dL — ABNORMAL HIGH (ref 70–99)
Glucose-Capillary: 147 mg/dL — ABNORMAL HIGH (ref 70–99)
Glucose-Capillary: 108 mg/dL — ABNORMAL HIGH (ref 70–99)

## 2020-01-24 LAB — COMPREHENSIVE METABOLIC PANEL
ALT: 198 U/L — ABNORMAL HIGH (ref 0–44)
ALT: 143 U/L — ABNORMAL HIGH (ref 0–44)
AST: 120 U/L — ABNORMAL HIGH (ref 15–41)
AST: 55 U/L — ABNORMAL HIGH (ref 15–41)
Albumin: 3.2 g/dL — ABNORMAL LOW (ref 3.5–5.0)
Albumin: 2.7 g/dL — ABNORMAL LOW (ref 3.5–5.0)
Alkaline Phosphatase: 164 U/L — ABNORMAL HIGH (ref 38–126)
Alkaline Phosphatase: 120 U/L (ref 38–126)
Anion gap: 17 — ABNORMAL HIGH (ref 5–15)
Anion gap: 13 (ref 5–15)
BUN: 44 mg/dL — ABNORMAL HIGH (ref 8–23)
BUN: 36 mg/dL — ABNORMAL HIGH (ref 8–23)
CO2: 17 mmol/L — ABNORMAL LOW (ref 22–32)
CO2: 19 mmol/L — ABNORMAL LOW (ref 22–32)
Calcium: 8.4 mg/dL — ABNORMAL LOW (ref 8.9–10.3)
Calcium: 8.2 mg/dL — ABNORMAL LOW (ref 8.9–10.3)
Chloride: 111 mmol/L (ref 98–111)
Chloride: 117 mmol/L — ABNORMAL HIGH (ref 98–111)
Creatinine, Ser: 2.17 mg/dL — ABNORMAL HIGH (ref 0.44–1.00)
Creatinine, Ser: 1.28 mg/dL — ABNORMAL HIGH (ref 0.44–1.00)
GFR calc Af Amer: 26 mL/min — ABNORMAL LOW (ref >60)
GFR calc Af Amer: 49 mL/min — ABNORMAL LOW (ref >60)
GFR calc non Af Amer: 23 mL/min — ABNORMAL LOW (ref >60)
GFR calc non Af Amer: 43 mL/min — ABNORMAL LOW (ref >60)
Glucose, Bld: 133 mg/dL — ABNORMAL HIGH (ref 70–99)
Glucose, Bld: 115 mg/dL — ABNORMAL HIGH (ref 70–99)
Potassium: 4.3 mmol/L (ref 3.5–5.1)
Potassium: 3 mmol/L — ABNORMAL LOW (ref 3.5–5.1)
Sodium: 145 mmol/L (ref 135–145)
Sodium: 149 mmol/L — ABNORMAL HIGH (ref 135–145)
Total Bilirubin: 1.2 mg/dL (ref 0.3–1.2)
Total Bilirubin: 1.1 mg/dL (ref 0.3–1.2)
Total Protein: 6.1 g/dL — ABNORMAL LOW (ref 6.5–8.1)
Total Protein: 5.8 g/dL — ABNORMAL LOW (ref 6.5–8.1)

## 2020-01-24 LAB — CBC WITH DIFFERENTIAL/PLATELET
Abs Immature Granulocytes: 4.7 10*3/uL — ABNORMAL HIGH (ref 0.00–0.07)
Abs Immature Granulocytes: 3.26 10*3/uL — ABNORMAL HIGH (ref 0.00–0.07)
Basophils Absolute: 0 10*3/uL (ref 0.0–0.1)
Basophils Absolute: 0 10*3/uL (ref 0.0–0.1)
Basophils Relative: 0 %
Basophils Relative: 0 %
Eosinophils Absolute: 0 10*3/uL (ref 0.0–0.5)
Eosinophils Absolute: 0 10*3/uL (ref 0.0–0.5)
Eosinophils Relative: 0 %
Eosinophils Relative: 0 %
HCT: 37.8 % (ref 36.0–46.0)
HCT: 38.3 % (ref 36.0–46.0)
Hemoglobin: 12.4 g/dL (ref 12.0–15.0)
Hemoglobin: 12.8 g/dL (ref 12.0–15.0)
Immature Granulocytes: 7 %
Immature Granulocytes: 4 %
Lymphocytes Relative: 1 %
Lymphocytes Relative: 1 %
Lymphs Abs: 0.7 10*3/uL (ref 0.7–4.0)
Lymphs Abs: 0.9 10*3/uL (ref 0.7–4.0)
MCH: 27.4 pg (ref 26.0–34.0)
MCH: 27.3 pg (ref 26.0–34.0)
MCHC: 32.8 g/dL (ref 30.0–36.0)
MCHC: 33.4 g/dL (ref 30.0–36.0)
MCV: 83.6 fL (ref 80.0–100.0)
MCV: 81.7 fL (ref 80.0–100.0)
Monocytes Absolute: 8.3 10*3/uL — ABNORMAL HIGH (ref 0.1–1.0)
Monocytes Absolute: 9.6 10*3/uL — ABNORMAL HIGH (ref 0.1–1.0)
Monocytes Relative: 12 %
Monocytes Relative: 13 %
Neutro Abs: 57.7 10*3/uL — ABNORMAL HIGH (ref 1.7–7.7)
Neutro Abs: 62.6 10*3/uL — ABNORMAL HIGH (ref 1.7–7.7)
Neutrophils Relative %: 80 %
Neutrophils Relative %: 82 %
Platelets: 126 10*3/uL — ABNORMAL LOW (ref 150–400)
Platelets: 143 10*3/uL — ABNORMAL LOW (ref 150–400)
RBC: 4.52 MIL/uL (ref 3.87–5.11)
RBC: 4.69 MIL/uL (ref 3.87–5.11)
RDW: 16.9 % — ABNORMAL HIGH (ref 11.5–15.5)
RDW: 17.2 % — ABNORMAL HIGH (ref 11.5–15.5)
WBC: 71.4 10*3/uL (ref 4.0–10.5)
WBC: 76.4 10*3/uL (ref 4.0–10.5)
nRBC: 0 % (ref 0.0–0.2)
nRBC: 0 % (ref 0.0–0.2)

## 2020-01-24 LAB — SARS CORONAVIRUS 2 (TAT 6-24 HRS): SARS Coronavirus 2: NEGATIVE

## 2020-01-24 LAB — BLOOD CULTURE ID PANEL (REFLEXED)

## 2020-01-24 LAB — HIV ANTIBODY (ROUTINE TESTING W REFLEX): HIV Screen 4th Generation wRfx: NONREACTIVE

## 2020-01-24 LAB — PHOSPHORUS: Phosphorus: 2.7 mg/dL (ref 2.5–4.6)

## 2020-01-24 LAB — MAGNESIUM: Magnesium: 1.5 mg/dL — ABNORMAL LOW (ref 1.7–2.4)

## 2020-01-24 MED ORDER — SODIUM CHLORIDE 0.9 % IV SOLN: INTRAVENOUS | Status: DC

## 2020-01-24 MED ORDER — LEVETIRACETAM IN NACL 1500 MG/100ML IV SOLN
1500.0000 mg | INTRAVENOUS | Status: AC
  Administered 2020-01-24: 08:00:00 1500 mg via INTRAVENOUS
  Filled 2020-01-24: qty 100

## 2020-01-24 MED ORDER — LEVALBUTEROL HCL 0.63 MG/3ML IN NEBU: 0.6300 mg | INHALATION_SOLUTION | Freq: Four times a day (QID) | RESPIRATORY_TRACT | Status: DC | PRN

## 2020-01-24 MED ORDER — ONDANSETRON HCL 4 MG PO TABS: 4.0000 mg | ORAL_TABLET | Freq: Four times a day (QID) | ORAL | Status: DC | PRN

## 2020-01-24 MED ORDER — LEVALBUTEROL HCL 0.63 MG/3ML IN NEBU: 0.6300 mg | INHALATION_SOLUTION | Freq: Four times a day (QID) | RESPIRATORY_TRACT | Status: DC

## 2020-01-24 MED ORDER — LORAZEPAM 2 MG/ML IJ SOLN: 1.0000 mg | Freq: Once | INTRAMUSCULAR | Status: DC | PRN

## 2020-01-24 MED ORDER — ACETAMINOPHEN 325 MG PO TABS: 650.0000 mg | ORAL_TABLET | Freq: Four times a day (QID) | ORAL | Status: DC | PRN

## 2020-01-24 MED ORDER — LEVETIRACETAM IN NACL 1000 MG/100ML IV SOLN
1000.0000 mg | INTRAVENOUS | Status: AC
  Administered 2020-01-24: 14:00:00 1000 mg via INTRAVENOUS
  Filled 2020-01-24: qty 100

## 2020-01-24 MED ORDER — ONDANSETRON HCL 4 MG/2ML IJ SOLN: 4.0000 mg | Freq: Four times a day (QID) | INTRAMUSCULAR | Status: DC | PRN

## 2020-01-24 MED ORDER — POTASSIUM CHLORIDE 10 MEQ/100ML IV SOLN
10.0000 meq | INTRAVENOUS | Status: AC
  Administered 2020-01-24 – 2020-01-25 (×3): 10 meq via INTRAVENOUS
  Filled 2020-01-24 (×2): qty 100

## 2020-01-24 MED ORDER — METOPROLOL TARTRATE 5 MG/5ML IV SOLN
2.5000 mg | Freq: Once | INTRAVENOUS | Status: AC
  Administered 2020-01-24: 22:00:00 2.5 mg via INTRAVENOUS
  Filled 2020-01-24: qty 5

## 2020-01-24 MED ORDER — SODIUM CHLORIDE 0.9 % IV SOLN: 2000.0000 mg | INTRAVENOUS | Status: DC

## 2020-01-24 MED ORDER — LORAZEPAM 2 MG/ML IJ SOLN
0.2500 mg | Freq: Once | INTRAMUSCULAR | Status: AC
  Administered 2020-01-24: 05:00:00 0.25 mg via INTRAVENOUS

## 2020-01-24 MED ORDER — ACETAMINOPHEN 650 MG RE SUPP
650.0000 mg | Freq: Four times a day (QID) | RECTAL | Status: DC | PRN
  Administered 2020-01-24: 650 mg via RECTAL
  Filled 2020-01-24: qty 1

## 2020-01-24 MED ORDER — SODIUM CHLORIDE 0.9 % IV SOLN
750.0000 mg | Freq: Two times a day (BID) | INTRAVENOUS | Status: DC
  Administered 2020-01-24 – 2020-01-29 (×10): 750 mg via INTRAVENOUS
  Filled 2020-01-24 (×13): qty 7.5

## 2020-01-24 MED ORDER — SODIUM CHLORIDE 0.9 % IV BOLUS
500.0000 mL | Freq: Once | INTRAVENOUS | Status: AC
  Administered 2020-01-24: 500 mL via INTRAVENOUS

## 2020-01-24 MED ORDER — LEVETIRACETAM IN NACL 500 MG/100ML IV SOLN
500.0000 mg | Freq: Two times a day (BID) | INTRAVENOUS | Status: DC
  Filled 2020-01-24: qty 100

## 2020-01-24 MED ORDER — LORAZEPAM 2 MG/ML IJ SOLN
1.0000 mg | Freq: Once | INTRAMUSCULAR | Status: AC
  Administered 2020-01-24: 18:00:00 1 mg via INTRAVENOUS
  Filled 2020-01-24: qty 1

## 2020-01-24 MED ORDER — HALOPERIDOL LACTATE 5 MG/ML IJ SOLN
2.0000 mg | Freq: Four times a day (QID) | INTRAMUSCULAR | Status: DC | PRN
  Administered 2020-01-24 – 2020-02-01 (×5): 2 mg via INTRAVENOUS
  Filled 2020-01-24 (×5): qty 1

## 2020-01-24 MED ORDER — DEXTROSE 5 % IV SOLN
680.0000 mg | INTRAVENOUS | Status: DC
  Administered 2020-01-24 – 2020-01-25 (×2): 680 mg via INTRAVENOUS
  Filled 2020-01-24 (×2): qty 13.6

## 2020-01-24 MED ORDER — POTASSIUM CL IN DEXTROSE 5% 20 MEQ/L IV SOLN
20.0000 meq | INTRAVENOUS | Status: DC
  Administered 2020-01-24 – 2020-01-25 (×2): 20 meq via INTRAVENOUS
  Filled 2020-01-24 (×2): qty 1000

## 2020-01-24 MED ORDER — LORAZEPAM 2 MG/ML IJ SOLN
INTRAMUSCULAR | Status: AC
  Filled 2020-01-24: qty 1

## 2020-01-24 MED ORDER — LABETALOL HCL 5 MG/ML IV SOLN
10.0000 mg | INTRAVENOUS | Status: DC | PRN
  Administered 2020-01-25 – 2020-01-27 (×5): 10 mg via INTRAVENOUS
  Filled 2020-01-24 (×6): qty 4

## 2020-01-24 MED ORDER — MAGNESIUM SULFATE 2 GM/50ML IV SOLN
2.0000 g | Freq: Once | INTRAVENOUS | Status: AC
  Administered 2020-01-24: 21:00:00 2 g via INTRAVENOUS
  Filled 2020-01-24: qty 50

## 2020-01-24 MED ORDER — POTASSIUM CHLORIDE 10 MEQ/100ML IV SOLN
10.0000 meq | INTRAVENOUS | Status: DC
  Filled 2020-01-24: qty 100

## 2020-01-24 NOTE — Progress Notes (Signed)
Pharmacy Antibiotic Note  Kathleen Duran is a 69 y.o. female admitted on 01/23/2020 with r/o meningitis .  Pharmacy has been consulted for Acyclovir dosing. Already on vancomycin and Merrem. WBC is markedly elevated (does have history of lymphoma). Appears to have AKI.   Plan: Acyclovir 680 mg IV q24h (dosing on adjusted body weight for BMI >30) Trend SCr, adjust dosing frequency as needed  Temp (24hrs), Avg:101 F (38.3 C), Min:101 F (38.3 C), Max:101 F (38.3 C)  Recent Labs  Lab 01/23/20 2029 01/23/20 2030 01/23/20 2039  WBC  --  78.9*  --   CREATININE  --  3.09* 3.20*  LATICACIDVEN 1.6  --   --     CrCl cannot be calculated (Unknown ideal weight.).    Allergies  Allergen Reactions  . Cephalosporins Other (See Comments)    ALLERGY to PCN with immediate rash, facial/tongue/throat swelling, SOB, lightheadedness with hypotension.  . Contrast Media [Iodinated Diagnostic Agents] Hives and Shortness Of Breath  . Penicillins Hives    Has patient had a PCN reaction causing immediate rash, facial/tongue/throat swelling, SOB or lightheadedness with hypotension: Yes Has patient had a PCN reaction causing severe rash involving mucus membranes or skin necrosis: No Has patient had a PCN reaction that required hospitalization No Has patient had a PCN reaction occurring within the last 10 years: No If all of the above answers are "NO", then may proceed with Cephalosporin use.  Marland Kitchen Amoxicillin Hives  . Codeine Nausea And Vomiting  . Keflex [Cephalexin] Hives  . Iodine Rash    Narda Bonds, PharmD, BCPS Clinical Pharmacist Phone: [phone]

## 2020-01-24 NOTE — Progress Notes (Signed)
LTM EEG hooked up and running - no initial skin breakdown - push button tested - neuro notified. Difficulty hook up family had to help  Same leads used

## 2020-01-24 NOTE — ED Notes (Signed)
MRI came to take patient over for her scan but advised this RN patient appears to agitated/is moving around too much to go over without pre meds. Dr. Hal Hope made aware.

## 2020-01-24 NOTE — Consult Note (Signed)
Requesting Physician: Dr.Kakrakandy    Chief Complaint: Altered mental status, fever  History obtained from: Patient and Chart     HPI:                                                                                                                                       Kathleen Duran is a 69 y.o. female with past medical history significant for leukemia, non-Hodgkin's lymphoma 2002, hiatal hernia, arthritis presents to the emergency department with altered mental status.  History obtained by chart review.  Patient was noted to be febrile in the emergency department temperature 101, noted to have AKI with creatinine of 3 and elevated white count of 70,000.  CT head was unremarkable and MRI brain was obtained which showed no acute findings (however without contrast).  LP is attempted but not successful.  Patient started empiric antibiotics and neurology was consulted due to concern for right gaze preference.  On assessment, patient moans to noxious stimulus but nonverbal.  Does have right gaze preference and intermittently has horizontal nystagmus.  Moving all 4 extremities against gravity.   Past Medical History:  Diagnosis Date  . Arthritis   . Cancer [facility])    b cell leukemia  1999 , non hodkins lymphoma 2002  . GERD (gastroesophageal reflux disease)    borderline bleeding ulcer   . History of hiatal hernia   . Urticaria   . Vertigo     Past Surgical History:  Procedure Laterality Date  . ANTERIOR CERVICAL DECOMP/DISCECTOMY FUSION N/A 10/14/2015   Procedure: ANTERIOR CERVICAL DECOMPRESSION/DISCECTOMY FUSION 1 LEVEL;  Surgeon: Phylliss Bob, MD;  Location: [place];  Service: Orthopedics;  Laterality: N/A;  Anterior cervical decompression fusion, cerivcal 4-5 with instrumentation and allograft  . CHOLECYSTECTOMY     2006  . HERNIA REPAIR     umbilical [id number]  . SHOULDER ARTHROSCOPY WITH BICEPSTENOTOMY Right 04/17/2016   Procedure: SHOULDER ARTHROSCOPY WITH BICEPSTENOTOMY;  Surgeon:  Tania Ade, MD;  Location: [place];  Service: Orthopedics;  Laterality: Right;  . SHOULDER ARTHROSCOPY WITH ROTATOR CUFF REPAIR AND SUBACROMIAL DECOMPRESSION Right 04/17/2016   Procedure: SHOULDER ARTHROSCOPY ROTATOR CUFF TEAR AND SUBACROMIAL DECOMPRESSION AND BICEPS TENOTOMY;  Surgeon: Tania Ade, MD;  Location: [place];  Service: Orthopedics;  Laterality: Right;  RIGHT SHOULDER ARTHROSCOPY ROTATOR CUFF TEAR AND SUBACROMIAL DECOMPRESSION  . spleenectomy 1999    . strangulated umbilical hernia [id number]    . TONSILLECTOMY     left tonsil    History reviewed. No pertinent family history. Social History:  reports that she has never smoked. She has never used smokeless tobacco. She reports current alcohol use. She reports that she does not use drugs.  Allergies:  Allergies  Allergen Reactions  . Cephalosporins Other (See Comments)    ALLERGY to PCN with immediate rash, facial/tongue/throat swelling, SOB, lightheadedness with hypotension.  . Contrast Media [Iodinated Diagnostic Agents]  Hives and Shortness Of Breath  . Penicillins Hives    Has patient had a PCN reaction causing immediate rash, facial/tongue/throat swelling, SOB or lightheadedness with hypotension: Yes Has patient had a PCN reaction causing severe rash involving mucus membranes or skin necrosis: No Has patient had a PCN reaction that required hospitalization No Has patient had a PCN reaction occurring within the last 10 years: No If all of the above answers are "NO", then may proceed with Cephalosporin use.  Marland Kitchen Amoxicillin Hives  . Codeine Nausea And Vomiting  . Keflex [Cephalexin] Hives  . Iodine Rash    Medications:                                                                                                                        I reviewed home medications   ROS:                                                                                                                                      Unable to review systems due to patient mental status   Examination:                                                                                                      General: Appears well-developed . Psych: Unable to obtain Eyes: No scleral injection HENT: No OP obstrucion, mild nuchal rigidity Head: Normocephalic.  Cardiovascular: Normal rate and regular rhythm.  Respiratory: Effort normal and breath sounds normal to anterior ascultation GI: Soft.  No distension. There is no tenderness.  Skin: WDI    Neurological Examination Mental Status: Lethargic, moans to noxious stimulus.  No comprehensible words.  Does not track examiner.  Not following any commands Cranial Nerves: II: Pupils are equal and reactive III,IV, VI: Right gaze preference, does cross midline.  Horizontal nystagmus seen intermittently V,VII: Do not appreciate any facial droop IX,X: Unable to assess XI: Unable to assess XII: midline tongue extension Motor: Moves all 4 extremities symmetrically  and antigravity Sensory: Withdraws to noxious stimulus equal Deep Tendon Reflexes: 2+ patellar reflexes Plantars: Right: downgoing   Left: downgoing Cerebellar: Unable to assess      Lab Results: Basic Metabolic Panel: Recent Labs  Lab 01/23/20 2030 01/23/20 2039 01/24/20 0416  NA 142 140 145  K 4.0 3.5 4.3  CL 107 109 111  CO2 18*  --  17*  GLUCOSE 150* 147* 133*  BUN 53* 47* 44*  CREATININE 3.09* 3.20* 2.17*  CALCIUM 8.8*  --  8.4*    CBC: Recent Labs  Lab 01/23/20 2030 01/23/20 2039  WBC 78.9*  --   NEUTROABS 64.4*  --   HGB 12.3 12.9  HCT 37.5 38.0  MCV 83.1  --   PLT 172  --     Coagulation Studies: Recent Labs    01/23/20 2030  LABPROT 16.5*  INR 1.3*    Imaging: CT Abdomen Pelvis Wo Contrast  Result Date: 01/23/2020 CLINICAL DATA:  Patient appears jaundiced history of lymphoma EXAM: CT ABDOMEN AND PELVIS WITHOUT CONTRAST TECHNIQUE: Multidetector CT imaging of  the abdomen and pelvis was performed following the standard protocol without IV contrast. COMPARISON:  None. FINDINGS: Lower chest: The visualized heart size within normal limits. No pericardial fluid/thickening. No hiatal hernia. The visualized portions of the lungs are clear. Hepatobiliary: Although limited due to the lack of intravenous contrast, normal in appearance without gross focal abnormality. The patient is status post cholecystectomy. No biliary ductal dilation. Pancreas:  Unremarkable.  No surrounding inflammatory changes. 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 Adrenals/Urinary Tract: Both adrenal glands appear normal. The kidneys and collecting system appear normal without evidence of urinary tract calculus or hydronephrosis. Bladder is unremarkable. Stomach/Bowel: The stomach, small bowel, and colon are normal in appearance. No inflammatory changes or obstructive findings. Scattered colonic diverticula are noted without diverticulitis. Appendix is normal. Vascular/Lymphatic: There are no enlarged abdominal or pelvic lymph nodes. Scattered aortic atherosclerotic calcifications are seen without aneurysmal dilatation. Reproductive: The uterus and adnexa are unremarkable. Other: No evidence of abdominal wall mass or hernia. Musculoskeletal: No acute or significant osseous findings. IMPRESSION: No acute intra-abdominal or pelvic pathology to explain the patient's symptoms. Diverticulosis without diverticulitis. Aortic Atherosclerosis (ICD10-I70.0). Electronically Signed   By: Prudencio Pair M.D.   On: 01/23/2020 22:03   CT Head Wo Contrast  Result Date: 01/23/2020 CLINICAL DATA:  Altered mental status. Left facial droop. EXAM: CT HEAD WITHOUT CONTRAST TECHNIQUE: Contiguous axial images were obtained from the base of the skull through the vertex without intravenous contrast. COMPARISON:  Brain MRI 11/24/2019 FINDINGS: Brain: No acute hemorrhage. No  extra-axial or subdural collection. Mild generalized atrophy with moderate periventricular and deep white matter hypodensity consistent with chronic small vessel ischemia. This appears slightly asymmetric in the right cerebral hemisphere but no definite cortical infarct. Small calcification in left basal ganglia, likely senescent. No midline shift, hydrocephalus or suspicious mass effect. Vascular: Atherosclerosis of skullbase vasculature without hyperdense vessel or abnormal calcification. Skull: No fracture or focal lesion. Sinuses/Orbits: Chronic paranasal sinus disease with near complete opacification of frontal sinuses, ethmoid air cells, left sphenoid and maxillary sinus. Mucosal thickening in the right sphenoid and maxillary sinus. The degree sinus opacification has slightly progressed from prior MRI. Mastoid air cells are clear. Other: None. IMPRESSION: 1. No hemorrhage or evidence of acute infarct. 2. Mild atrophy with moderate chronic small vessel ischemia. 3. Chronic paranasal sinus disease, slightly progressed from January MRI. Electronically Signed  By: Keith Rake M.D.   On: 01/23/2020 22:02   MR BRAIN WO CONTRAST  Result Date: 01/24/2020 CLINICAL DATA:  Initial evaluation for acute encephalopathy, altered mental status. History of leukemia, in remission. EXAM: MRI HEAD WITHOUT CONTRAST TECHNIQUE: Multiplanar, multiecho pulse sequences of the brain and surrounding structures were obtained without intravenous contrast. COMPARISON:  Prior CT from 01/23/2020 as well as previous MRI from 11/24/2019. FINDINGS: Brain: Examination somewhat technically limited by susceptibility artifact emanating from the scalp. Generalized age-related cerebral atrophy. Patchy T2/FLAIR hyperintensity within the periventricular and deep white matter both cerebral hemispheres again seen, nonspecific, but most like related chronic microvascular ischemic disease, stable from previous. No abnormal foci of restricted  diffusion to suggest acute or subacute ischemia. Gray-white matter differentiation maintained. No encephalomalacia to suggest chronic cortical infarction. Diffuse FLAIR hyperintensity seen throughout the cerebral sulci, suspected to be artifactual nature due to overlying susceptibility artifact. Possible proteinaceous material not entirely excluded. No associated susceptibility artifact to suggest subarachnoid hemorrhage. No mass lesion, midline shift, or mass effect. Ventricles normal size without hydrocephalus. No appreciable intraventricular debris. No extra-axial fluid collection. No made of an empty sella. Midline structures intact. Vascular: Major intracranial vascular flow voids are maintained. Skull and upper cervical spine: Craniocervical junction within normal limits. Postsurgical changes partially visualize within the upper cervical spine. Bone marrow signal intensity heterogeneous and diffusely decreased on T1 weighted imaging, most like related history of leukemia. Susceptibility artifact involving the left greater than right scalp, of uncertain etiology. Sinuses/Orbits: Globes and orbital soft tissues demonstrate no acute finding. Extensive opacity and mucosal thickening seen throughout the paranasal sinuses, with superimposed right maxillary sinus air-fluid level. Findings could be infectious and/or inflammatory nature. Trace bilateral mastoid effusions. Visualized inner ear structures grossly normal. Other: None. IMPRESSION: 1. Technically limited exam due to susceptibility artifact emanating from the scalp, of uncertain etiology. 2. Diffuse FLAIR hyperintensity throughout the cerebral sulci and surrounding the brainstem and cerebellum. While this finding could be artifactual in nature related to overlying susceptibility artifact, proteinaceous material and changes related to meningitis could also have this appearance. Correlation with LP and CSF studies recommended for correlated purposes as  clinically warranted. No susceptibility artifact to suggest subarachnoid hemorrhage. 3. 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 4. Extensive pan sinusitis. This could be either infectious or inflammatory in nature. Electronically Signed   By: Jeannine Boga M.D.   On: 01/24/2020 06:46   DG Chest Port 1 View  Result Date: 01/23/2020 CLINICAL DATA:  69 year old female with sepsis. EXAM: PORTABLE CHEST 1 VIEW COMPARISON:  Chest radiographs 12/19/2018 and earlier. FINDINGS: Portable AP semi upright view at 2045 hours. Larger lung volumes. Cardiac size at the upper limits of normal. Calcified aortic atherosclerosis. Other mediastinal contours are within normal limits. Visualized tracheal air column is within normal limits. Allowing for portable technique the lungs are clear. Chronic left upper quadrant surgical clips and cervical ACDF. Negative visible bowel gas pattern. No acute osseous abnormality identified. IMPRESSION: No acute cardiopulmonary abnormality. Aortic Atherosclerosis (ICD10-I70.0). Electronically Signed   By: Genevie Ann M.D.   On: 01/23/2020 20:55     ASSESSMENT AND PLAN  69 y.o. female with past medical history significant for leukemia, non-Hodgkin's lymphoma 2002, hiatal hernia, arthritis presents to the emergency department with altered mental status, fever, significantly elevated leukocytosis, with a rightward gaze preference.   Suspected meningo-encephalitis Acute metabolic encephalopathy Possibly nonconvulsive status epilepticus (NCSE)  Recommendations Stat EEG ordered to  rule out NCSE Loaded with 1.5 g of Keppra Lumbar puncture: Please check coags before performing Recommend starting acyclovir for empiric viral coverage, consult to pharmacy placed Seizure precautions   Josephyne Tarter [facility] Pager Number [phone]

## 2020-01-24 NOTE — Progress Notes (Signed)
Pt moved and online [phone]

## 2020-01-24 NOTE — Progress Notes (Signed)
Care started prior to midnight in the emergency room patient admitted early this morning after midnight by my partner and colleague Dr. Gean Birchwood and I am in current agreement with his assessment and plan.  Additional changes to the plan of care have been made accordingly.  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  History is not able to be obtained from the patient given her current condition but is reported that she is not feeling well for last 24 hours and has been running a fever and has been vomiting..  In the ED she is found to be febrile and septic with a temperature of 101, tachycardic as well as an elevated white blood cell count of 78,000.  She is also found to have an acute kidney injury with a creatinine of 3.09.  She did have positive neck rigidity and a CT of the head and CT of the abdomen were done which were unremarkable.  Also noted that she had acute liver injury with elevated LFTs.  Chest x-ray did not show anything and COVID-19 testing was negative.  Urinalysis did show some hyaline casts and some WBCs 11-20 with trace leukocytes.  Lumbar puncture was attempted twice in the ED however was unsuccessful but nonetheless she is started on empiric antibiotics for possible meningitis and admitted for further work-up.  MRI was done and was technically limited but there was diffuse fluid hyperdensity throughout the cerebral sulcal and surrounding brainstem and cerebellum.  While this finding could be artificial nature related to overlying susceptibility artifact however proteinaceous material and changes related meningitis could not be excluded and an LP is to be done but because this is unsuccessful at the bedside will be done  under fluoroscopy and I received approval from Dr. Posey Pronto the radiologist for this.  She will be going for her LP soon.  Neurology was consulted and they have ordered a EEG as well and patient was started on meningitis treatment with vancomycin, meropenem as well as acyclovir.  Her WBC is improving as well as her renal function.  Liver enzymes are also trending down slightly and patient continues to have a metabolic acidosis.  Because of her history of promyelocytic leukemia oncology is consulted and they have no further recommendations at this time from a hematological standpoint.  She was admitted for an being treated for the following but not limited to:  Sepsis with concern for meningitis/encephalitis -MRI as above.   -Dr. Hal Hope Discussed with neurology about the MRI does not show any acute stroke. -Since ER physician attempted lumbar puncture and was not successful will consult fluoroscopy for Lumbar puncture which is still pending to be done.   -Presently on empiric antibiotics for meningitis/encephalitis and have added Acyvlovir.   -Follow cultures.   -Check EEG.   -Neurology has been consulted. -Blood Cx showed 3/4 + Cx for Streptococcus Pneumoniae  Significant leukocytosis  -With history of promyelocytic leukemia Dr. Burney Gauze oncology has been consulted. -Oncologist at this time feels that patient's symptoms may be related to infection. -C/w Abx  History of hypertension  -We will keep patient on as needed IV labetalol for now.  History of GERD.  Elevated LFTs  -In the setting of Sepsis -Check acute hepatitis panel and follow LFTs.  Acute encephalopathy -Could be from meningitis encephalitis or sepsis.  See #1.  Acute renal failure  -Pprobably sepsis related.   -Continue hydration follow metabolic panel intake output.  History of leukemia.  See #2.  Recent follow-up with oncology at [facility] was concerning for interstitial lung disease.  We will continue to monitor  patient's clinical response to intervention and follow-up on neurology recommendations.  Will await for LP to be done and continue antibiotic treatment at this time as patient remains confused.

## 2020-01-24 NOTE — Consult Note (Signed)
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 : Patient cannot give any history.  HPI: Kathleen Duran is a nice 69 year old white female.  Again she cannot give any medical history given her change in mental status.  She has been followed at [facility].  Her history goes back to 1999 when she was found to have splenomegaly.  She underwent a splenectomy.  She subsequently was found to have a form of CLL, that being prolymphocytic leukemia.  This was a B-cell lymphoma.  She was treated with chemotherapy.  She has been in remission.  She was recently seen probably a week or so ago at [facility].  She has been having some issues with cough.  I think she has had undergone a bronchoscopy.  I think biopsies just show some interstitial disease.  She came to emergency room at [facility] on Friday, and she had a change in mental status.  There was concern about meningitis.  When she came in, her CBC showed a white cell count of 79,000.  Hemoglobin 12.3.  Platelet count 172,000.  Her white cell differential was 82 segs 2 lymphs and 12 monocytes.  The last CBC that she had at [facility] back in October 2020 showed a white cell count of 12,000.  Hemoglobin 12.5 and platelet count was 246,000.  She had a white cell differential of 51 segs 12 lymphs 28 monocytes 8 eosinophils.  She had an MRI of the brain.  There is questionable changes that could be related to meningitis.  She had no tumors.  She had some pan sinusitis.  She had a CT scan of the abdomen pelvis.  This did not show any adenopathy.  There is no hepatomegaly.  Because of the history of leukemia, we are asked to see her to make sure that there is no evidence of relapse.    Past Medical History:  Diagnosis Date  . Arthritis   . Cancer [facility])    b cell leukemia  1999 , non hodkins lymphoma 2002  . GERD (gastroesophageal reflux disease)     borderline bleeding ulcer   . History of hiatal hernia   . Urticaria   . Vertigo   :  Past Surgical History:  Procedure Laterality Date  . ANTERIOR CERVICAL DECOMP/DISCECTOMY FUSION N/A 10/14/2015   Procedure: ANTERIOR CERVICAL DECOMPRESSION/DISCECTOMY FUSION 1 LEVEL;  Surgeon: Phylliss Bob, MD;  Location: [place];  Service: Orthopedics;  Laterality: N/A;  Anterior cervical decompression fusion, cerivcal 4-5 with instrumentation and allograft  . CHOLECYSTECTOMY     2006  . HERNIA REPAIR     umbilical [id number]  . SHOULDER ARTHROSCOPY WITH BICEPSTENOTOMY Right 04/17/2016   Procedure: SHOULDER ARTHROSCOPY WITH BICEPSTENOTOMY;  Surgeon: Tania Ade, MD;  Location: [place];  Service: Orthopedics;  Laterality: Right;  . SHOULDER ARTHROSCOPY WITH ROTATOR CUFF REPAIR AND SUBACROMIAL DECOMPRESSION Right 04/17/2016   Procedure: SHOULDER ARTHROSCOPY ROTATOR CUFF TEAR AND SUBACROMIAL DECOMPRESSION AND BICEPS TENOTOMY;  Surgeon: Tania Ade, MD;  Location: [place];  Service: Orthopedics;  Laterality: Right;  RIGHT SHOULDER ARTHROSCOPY ROTATOR CUFF TEAR AND SUBACROMIAL DECOMPRESSION  . spleenectomy 1999    . strangulated umbilical hernia [id number]    . TONSILLECTOMY     left tonsil  :   Current Facility-Administered Medications:  .  0.9 %  sodium chloride infusion, , Intravenous, Continuous, Rise Patience, MD, Last Rate: 125 mL/hr at 01/24/20 0107, New Bag  at 01/24/20 0107 .  acetaminophen (TYLENOL) tablet 650 mg, 650 mg, Oral, Q6H PRN **OR** acetaminophen (TYLENOL) suppository 650 mg, 650 mg, Rectal, Q6H PRN, Rise Patience, MD, 650 mg at 01/24/20 0107 .  acyclovir (ZOVIRAX) 680 mg in dextrose 5 % 100 mL IVPB, 680 mg, Intravenous, Q24H, Erenest Blank, RPH, Stopped at 01/24/20 0417 .  labetalol (NORMODYNE) injection 10 mg, 10 mg, Intravenous, Q2H PRN, Rise Patience, MD .  levETIRAcetam (KEPPRA) IVPB 1500 mg/ 100 mL premix, 1,500 mg,  Intravenous, STAT, Amie Portland, MD .  levETIRAcetam (KEPPRA) IVPB 500 mg/100 mL premix, 500 mg, Intravenous, Q12H, Amie Portland, MD .  LORazepam (ATIVAN) 2 MG/ML injection, , , ,  .  meropenem (MERREM) 1 g in sodium chloride 0.9 % 100 mL IVPB, 1 g, Intravenous, Q12H, Rise Patience, MD, Stopped at 01/23/20 2320 .  ondansetron (ZOFRAN) tablet 4 mg, 4 mg, Oral, Q6H PRN **OR** ondansetron (ZOFRAN) injection 4 mg, 4 mg, Intravenous, Q6H PRN, Rise Patience, MD .  vancomycin variable dose per unstable renal function (pharmacist dosing), , Does not apply, See admin instructions, Rise Patience, MD  Current Outpatient Medications:  .  aspirin 81 MG tablet, Take 81 mg by mouth daily., Disp: , Rfl:  .  atorvastatin (LIPITOR) 40 MG tablet, Take 40 mg by mouth at bedtime., Disp: , Rfl:  .  azelastine (ASTELIN) 0.1 % nasal spray, Place 2 sprays into both nostrils 2 (two) times daily., Disp: 30 mL, Rfl: 1 .  Biotin (BIOTIN MAXIMUM STRENGTH) 10 MG TABS, Take 10 mg by mouth daily., Disp: , Rfl:  .  Calcium Carb-Cholecalciferol (CALCIUM 600 + D PO), Take 1 tablet by mouth daily., Disp: , Rfl:  .  Carbinoxamine Maleate 4 MG TABS, Take 1 tablet (4 mg total) by mouth every 8 (eight) hours as needed., Disp: 56 each, Rfl: 1 .  clobetasol cream (TEMOVATE) [id number] %, Apply 1 application topically daily as needed (rash)., Disp: , Rfl:  .  docusate sodium (COLACE) 100 MG capsule, Take 1 capsule (100 mg total) by mouth 3 (three) times daily as needed. (Patient not taking: Reported on 04/22/2018), Disp: 20 capsule, Rfl: 0 .  fluticasone (FLONASE) 50 MCG/ACT nasal spray, Place 2 sprays into both nostrils daily., Disp: 16 g, Rfl: 5 .  irbesartan (AVAPRO) 300 MG tablet, Take 300 mg by mouth at bedtime., Disp: , Rfl:  .  metoprolol (LOPRESSOR) 50 MG tablet, Take 50 mg by mouth daily., Disp: , Rfl:  .  minoxidil (ROGAINE) 2 % external solution, Apply 1 mL topically 1 day or 1 dose., Disp: , Rfl:  .  Multiple  Vitamin (MULTIVITAMIN WITH MINERALS) TABS tablet, Take 1 tablet by mouth daily., Disp: , Rfl:  .  omeprazole-sodium bicarbonate (ZEGERID) 40-1100 MG capsule, Take 1 capsule by mouth daily before breakfast., Disp: , Rfl:  .  oxyCODONE-acetaminophen (ROXICET) 5-325 MG tablet, Take 1-2 tablets by mouth every 4 (four) hours as needed for severe pain. (Patient not taking: Reported on 04/22/2018), Disp: 60 tablet, Rfl: 0 .  predniSONE (DELTASONE) 10 MG tablet, 2 tablets daily for 4 days then 1 tablet on day 5 then STOP!, Disp: 9 tablet, Rfl: 0 .  ranitidine (ZANTAC) 150 MG tablet, Take 1 tablet (150 mg total) by mouth at bedtime., Disp: 60 tablet, Rfl: 1 .  vitamin C (ASCORBIC ACID) 500 MG tablet, Take 1,000 mg by mouth daily., Disp: , Rfl:  .  vitamin E 400 UNIT capsule, Take 400 Units by  mouth daily., Disp: , Rfl: :  . LORazepam      . vancomycin variable dose per unstable renal function (pharmacist dosing)   Does not apply See admin instructions  :  Allergies  Allergen Reactions  . Cephalosporins Other (See Comments)    ALLERGY to PCN with immediate rash, facial/tongue/throat swelling, SOB, lightheadedness with hypotension.  . Contrast Media [Iodinated Diagnostic Agents] Hives and Shortness Of Breath  . Penicillins Hives    Has patient had a PCN reaction causing immediate rash, facial/tongue/throat swelling, SOB or lightheadedness with hypotension: Yes Has patient had a PCN reaction causing severe rash involving mucus membranes or skin necrosis: No Has patient had a PCN reaction that required hospitalization No Has patient had a PCN reaction occurring within the last 10 years: No If all of the above answers are "NO", then may proceed with Cephalosporin use.  Marland Kitchen Amoxicillin Hives  . Codeine Nausea And Vomiting  . Keflex [Cephalexin] Hives  . Iodine Rash  :  History reviewed. No pertinent family history.:  Social History   Socioeconomic History  . Marital status: Married    Spouse name:  Not on file  . Number of children: Not on file  . Years of education: Not on file  . Highest education level: Not on file  Occupational History  . Not on file  Tobacco Use  . Smoking status: Never Smoker  . Smokeless tobacco: Never Used  Substance and Sexual Activity  . Alcohol use: Yes    Comment: rarely  . Drug use: No  . Sexual activity: Yes    Birth control/protection: Post-menopausal  Other Topics Concern  . Not on file  Social History Narrative  . Not on file   Social Determinants of Health   Financial Resource Strain:   . Difficulty of Paying Living Expenses:   Food Insecurity:   . Worried About Charity fundraiser in the Last Year:   . Arboriculturist in the Last Year:   Transportation Needs:   . Film/video editor (Medical):   Marland Kitchen Lack of Transportation (Non-Medical):   Physical Activity:   . Days of Exercise per Week:   . Minutes of Exercise per Session:   Stress:   . Feeling of Stress :   Social Connections:   . Frequency of Communication with Friends and Family:   . Frequency of Social Gatherings with Friends and Family:   . Attends Religious Services:   . Active Member of Clubs or Organizations:   . Attends Archivist Meetings:   Marland Kitchen Marital Status:   Intimate Partner Violence:   . Fear of Current or Ex-Partner:   . Emotionally Abused:   Marland Kitchen Physically Abused:   . Sexually Abused:   :   Patient cannot give any history.  Exam: Ill-appearing white female in no obvious distress.  Her vital signs show temperature of 98.4.  Pulse 107.  Blood pressure 153/74.  Her head neck exam shows no obvious scleral icterus.  She has no adenopathy in the neck or supraclavicular region.  Lungs sound relatively clear bilaterally.  Cardiac exam tachycardic but regular.  Abdomen is soft.  She has no fluid wave.  She has the splenectomy scar.  There is no hepatomegaly.  Extremities shows no clubbing, cyanosis or edema.  Skin exam shows some scattered ecchymoses.   Neurological exam shows no focal deficits.      Patient Vitals for the past 24 hrs:  BP Temp Temp src Pulse Resp  SpO2  01/24/20 0645 (!) 153/74 -- -- (!) 107 -- 99 %  01/24/20 0630 (!) 154/78 -- -- (!) 105 -- 97 %  01/24/20 0627 -- 98.4 F (36.9 C) Rectal -- -- --  01/24/20 0445 (!) 151/80 -- -- (!) 114 -- 96 %  01/24/20 0430 137/77 -- -- (!) 103 -- 98 %  01/24/20 0415 140/64 -- -- (!) 109 15 95 %  01/24/20 0400 131/62 -- -- (!) 107 -- 97 %  01/24/20 0345 (!) 141/65 -- -- (!) 110 -- 94 %  01/24/20 0330 (!) 155/67 -- -- (!) 102 -- 95 %  01/24/20 0230 (!) 154/71 -- -- (!) 106 (!) 22 99 %  01/24/20 0215 (!) 156/65 -- -- (!) 106 (!) 25 96 %  01/24/20 0145 (!) 153/68 -- -- (!) 106 (!) 32 98 %  01/24/20 0100 (!) 164/68 -- -- 98 (!) 31 94 %  01/24/20 0030 (!) 174/71 -- -- 99 (!) 23 96 %  01/24/20 0026 (!) 167/84 -- -- (!) 101 (!) 21 96 %  01/23/20 2359 -- -- -- (!) 106 (!) 30 97 %  01/23/20 2345 -- -- -- (!) 102 17 99 %  01/23/20 2322 -- -- -- (!) 104 (!) 21 99 %  01/23/20 2315 -- -- -- 100 19 97 %  01/23/20 2300 -- -- -- 100 (!) 31 98 %  01/23/20 2245 (!) 154/125 -- -- (!) 103 (!) 24 95 %  01/23/20 2238 -- -- -- 99 (!) 27 100 %  01/23/20 2230 -- -- -- 96 13 99 %  01/23/20 2220 (!) 167/76 -- -- 93 (!) 30 100 %  01/23/20 2215 -- -- -- 96 (!) 32 100 %  01/23/20 2208 -- -- -- 95 (!) 26 100 %  01/23/20 2130 -- -- -- 94 16 97 %  01/23/20 2115 (!) 174/75 -- -- 94 (!) 23 96 %  01/23/20 2045 (!) 180/127 -- -- 88 (!) 21 98 %  01/23/20 2031 -- -- -- 83 -- 93 %  01/23/20 2015 (!) 170/76 (!) 101 F (38.3 C) Rectal -- -- --     Recent Labs    01/23/20 2030 01/23/20 2039  WBC 78.9*  --   HGB 12.3 12.9  HCT 37.5 38.0  PLT 172  --    Recent Labs    01/23/20 2030 01/23/20 2030 01/23/20 2039 01/24/20 0416  NA 142   < > 140 145  K 4.0   < > 3.5 4.3  CL 107   < > 109 111  CO2 18*  --   --  17*  GLUCOSE 150*   < > 147* 133*  BUN 53*   < > 47* 44*  CREATININE 3.09*   < > 3.20*  2.17*  CALCIUM 8.8*  --   --  8.4*   < > = values in this interval not displayed.    Blood smear review: Normochromic and normocytic population of red blood cells.  She has no nucleated red blood cells.  I saw no teardrop cells.  She had no schistocytes.  There was no rouleaux formation.  White cells were increased in number.  The vast majority were mature polys.  I saw no immature lymphocytes.  She had no blasts.  Platelets were adequate number and size.  Pathology: None    Assessment and Plan: Ms. Valladarez is a very nice 69 year old white female.  She has a remote history of prolymphocytic leukemia.  She has been  in remission after treatment.  I think the problem that we have is that she has had her spleen out.  Because of this, she is at risk for encapsulated organism infection.  I would have to think that she has had her vaccinations and are up-to-date with these.  I would think that [facility] probably would have a record of this.  I would definitely cover her for encapsulated organisms.  And she is at risk for meningitis.  She will need to have a lumbar puncture done.  The blood smear certainly looks like reactive leukocytosis.  I think what is amazing is that she is still in remission from the CLL.  It has been about 20 years.  She has done quite well from my perspective.  Again, I think the problem is that she has had her spleen taken out and as such is at risk for infections by encapsulated organisms.  We will be more than happy to follow along but thankfully, I just do not see that we are going to have any hematologic issue to combat.  Lattie Haw, MD  Kathleen Duran

## 2020-01-24 NOTE — ED Notes (Signed)
This RN went in to check on pt and realized pt's left AC IV was infiltrated. This RN stopped IV and removed it immediately. Pt has a small lump on the inside of her left elbow. Pt was receiving NS @ 125 ml/hr. The pt had also received merrem an hour prior in the same IV. This RN called Corene Cornea in pharmacy and spoke to him about the situation and if anything further needed to be done. Per Pharmacy nothing more needs to be done. Will continue to monitor.

## 2020-01-24 NOTE — Progress Notes (Signed)
Pharmacy Antibiotic Note  Kathleen Duran is a 69 y.o. female admitted on 01/23/2020 with r/o meningitis .  Pharmacy has been consulted for Acyclovir dosing. Already on vancomycin and Merrem. WBC is markedly elevated (does have history of lymphoma). Appears to have AKI but is improving   Plan: Acyclovir 680 mg IV q24h (dosing on adjusted body weight for BMI >30) Trend SCr, adjust dosing frequency as needed LP f/u cultures F/u need to increase acyclovir if renal function improves right on borderline ~14mL/min with Scr 2.17  Temp (24hrs), Avg:99.7 F (37.6 C), Min:98.4 F (36.9 C), Max:101 F (38.3 C)  Recent Labs  Lab 01/23/20 2029 01/23/20 2030 01/23/20 2039 01/24/20 0416  WBC  --  78.9*  --   --   CREATININE  --  3.09* 3.20* 2.17*  LATICACIDVEN 1.6  --   --   --     CrCl cannot be calculated (Unknown ideal weight.).    Allergies  Allergen Reactions  . Cephalosporins Other (See Comments)    ALLERGY to PCN with immediate rash, facial/tongue/throat swelling, SOB, lightheadedness with hypotension.  . Contrast Media [Iodinated Diagnostic Agents] Hives and Shortness Of Breath  . Penicillins Hives    Has patient had a PCN reaction causing immediate rash, facial/tongue/throat swelling, SOB or lightheadedness with hypotension: Yes Has patient had a PCN reaction causing severe rash involving mucus membranes or skin necrosis: No Has patient had a PCN reaction that required hospitalization No Has patient had a PCN reaction occurring within the last 10 years: No If all of the above answers are "NO", then may proceed with Cephalosporin use.  Marland Kitchen Amoxicillin Hives  . Codeine Nausea And Vomiting  . Keflex [Cephalexin] Hives  . Iodine Rash    Nicoletta Dress, PharmD PGY2 Infectious Disease Pharmacy Resident

## 2020-01-24 NOTE — ED Notes (Signed)
Pt transported to Xray. 

## 2020-01-24 NOTE — Progress Notes (Signed)
PHARMACY - PHYSICIAN COMMUNICATION CRITICAL VALUE ALERT - BLOOD CULTURE IDENTIFICATION (BCID)  Kathleen Duran is an 69 y.o. female who presented to [facility] on 01/23/2020 with a chief complaint of altered mental status and there was concern for meningitis.   Assessment:  3/4 bottles positive for Streptococcus pneumoniae  Name of physician (or Provider) Contacted: Dr. Alfredia Ferguson  Current antibiotics: Vancomycin, Meropenem, Acyclovir   Changes to prescribed antibiotics recommended:  Recommendations accepted by provider  Continue current antibiotics given patients allergies to penicillin and cephalosporins and no history of either being administered. Can likely d/c the acyclovir given these findings, waiting to hear back.  Results for orders placed or performed during the hospital encounter of 01/23/20  Blood Culture ID Panel (Reflexed) (Collected: 01/23/2020  8:55 PM)  Result Value Ref Range   Enterococcus species NOT DETECTED NOT DETECTED   Listeria monocytogenes NOT DETECTED NOT DETECTED   Staphylococcus species NOT DETECTED NOT DETECTED   Staphylococcus aureus (BCID) NOT DETECTED NOT DETECTED   Streptococcus species DETECTED (A) NOT DETECTED   Streptococcus agalactiae NOT DETECTED NOT DETECTED   Streptococcus pneumoniae DETECTED (A) NOT DETECTED   Streptococcus pyogenes NOT DETECTED NOT DETECTED   Acinetobacter baumannii NOT DETECTED NOT DETECTED   Enterobacteriaceae species NOT DETECTED NOT DETECTED   Enterobacter cloacae complex NOT DETECTED NOT DETECTED   Escherichia coli NOT DETECTED NOT DETECTED   Klebsiella oxytoca NOT DETECTED NOT DETECTED   Klebsiella pneumoniae NOT DETECTED NOT DETECTED   Proteus species NOT DETECTED NOT DETECTED   Serratia marcescens NOT DETECTED NOT DETECTED   Haemophilus influenzae NOT DETECTED NOT DETECTED   Neisseria meningitidis NOT DETECTED NOT DETECTED   Pseudomonas aeruginosa NOT DETECTED NOT DETECTED   Candida albicans NOT DETECTED NOT  DETECTED   Candida glabrata NOT DETECTED NOT DETECTED   Candida krusei NOT DETECTED NOT DETECTED   Candida parapsilosis NOT DETECTED NOT DETECTED   Candida tropicalis NOT DETECTED NOT DETECTED    Phillis Haggis 01/24/2020  11:36 AM

## 2020-01-24 NOTE — ED Notes (Signed)
Pt cleaned up, sheets changed, and placed on purewick. Given Tylenol for fever

## 2020-01-24 NOTE — Plan of Care (Signed)
Briefly examined the patient in the emergency room while she was getting the EEG. She cannot follow any commands. Very drowsy at this time. Did not have any gaze preference-had roving eye movements. Withdraws all 4 to noxious stimulation but weaker on the left leg compared to all other extremities.  Discussed EEG with Dr. Hortense Ramal.  Initial EEG with right hemispheric cortical dysfunction.  Likely postictal state.  No seizures.  Continued on LTM.  Discussed with Dr. Hortense Ramal again.  LTM EEG shows some rhythmic slowing on the left.   With examination showing no improvement, will increase Keppra.  We will give an additional 1 g IV load and put the standing doses ordered to be 750 mg twice daily.  We will continue to follow.  -- Amie Portland, MD [facility] Pager: [phone] If 7pm to 7am, please call on call as listed on AMION.

## 2020-01-24 NOTE — Procedures (Addendum)
Patient Name: Kathleen Duran  MRN: [id number]  Epilepsy Attending: Lora Havens  Referring Physician/Provider: Dr Amie Portland Date: 01/24/2020 Duration: 24.09 mins  Patient history: 69 y.o.femalewith past medical history significant for leukemia, non-Hodgkin's lymphoma 2002 presented with altered mental status, fever, significantly elevated leukocytosis, with a rightward gaze preference. EEG to evaluate for status epilepticus.   Level of alertness: lethargic  AEDs during EEG study: Ativan, Keppra  Technical aspects: This EEG study was done with scalp electrodes positioned according to the 10-20 International system of electrode placement. Electrical activity was acquired at a sampling rate of 500Hz  and reviewed with a high frequency filter of 70Hz  and a low frequency filter of 1Hz . EEG data were recorded continuously and digitally stored.   DESCRIPTION: EEG showed continuous 3-4Hz  delta slowing in right hemisphere. There is also continuous 5-8Hz  theta-alpha activity in left hemisphere with intermittent 2-3hz  delta activity..  Hyperventilation and photic stimulation were not performed.  ABNORMALITY - Continuous slow, generalized and lateralized right hemisphere  IMPRESSION: This study is suggestive of cortical dysfunction in right hemisphere likely secondary to post-ictal state, underlying structural abnormality.  No seizures or definite epileptiform discharges were seen throughout the recording.  Dr Rory Percy was notified.  [name]

## 2020-01-24 NOTE — Procedures (Addendum)
Patient Name: Kathleen Duran  MRN: [id number]  Epilepsy Attending: Lora Havens  Referring Physician/Provider: Dr Amie Portland Duration: 01/24/2020 [id number] to 01/25/2020 [id number]  Patient history: 69 y.o.femalewith past medical history significant for leukemia, non-Hodgkin's lymphoma 2002 presented with altered mental status, fever, significantly elevated leukocytosis, with a rightward gaze preference. EEG to evaluate for status epilepticus.   Level of alertness: lethargic  AEDs during EEG study: Ativan, Keppra  Technical aspects: This EEG study was done with scalp electrodes positioned according to the 10-20 International system of electrode placement. Electrical activity was acquired at a sampling rate of 500Hz  and reviewed with a high frequency filter of 70Hz  and a low frequency filter of 1Hz . EEG data were recorded continuously and digitally stored.   DESCRIPTION: EEG showed continuous 5-8Hz  theta-alpha activity in left hemisphere with intermittent 2-3hz  delta activity which at times appears quasi rhythmic. Sharp waves were also noted in left frontal region. Continuous 3-4Hz  low amplitude delta slowing was also noted in right hemisphere. Hyperventilation and photic stimulation were not performed.  ABNORMALITY - Sharp waves, left frontotemporal region - Intermittent rhythmic delta activity, left frontal region - Continuous slow, generalized and lateralized right hemisphere  IMPRESSION: This study is suggestive of epileptogenicity and cortical dysfunction in left frontotemporal region. Quasi rhythmic delta activity with sharp waves in left frontotemporal region can be on the ictal-interictal continuum. Additionally, there is evidence of cortical dysfunction in right hemisphere likely secondary to post-ictal state, underlying structural abnormality.  No definite seizures were seen throughout the recording.  Tarvares Lant Barbra Sarks

## 2020-01-24 NOTE — H&P (Addendum)
History and Physical    Kathleen Duran [id number] DOB: 04-05-1951 DOA: 01/23/2020  PCP: Arlyss Repress, MD  Patient coming from: Home.  Chief Complaint: Altered mental status.  History was obtained from ER physician since I am not able to reach patient's husband.  Patient is confused.  HPI: Kathleen Duran is a 69 y.o. female with history of promyelocytic leukemia under remission, hypertension, GERD was found to be confused and was brought to the ER.  As per the patient has been you spoke with ER patient patient has not been feeling well for the last 24 hours and has been running a fever he had vomiting.  Later in the evening patient was found to be confused and was brought to the ER.  No further history is available at this time.  ED Course: In the ER patient was febrile with temperature 101 F tachycardic and labs show significantly elevated WBC count of 78,000.  Creatinine is increased to 3.09 was around normal in October.  On exam patient is altered.  Neck rigidity positive.  CT head and CT abdomen pelvis was unremarkable.  LFTs were elevated at 161 and 240.  Total bilirubin was 0.7.  Chest x-ray did not show anything acute Covid test was negative.  Urine showed some hyaline casts and WBC of 11-20 with trace leukocytes.  Had lumbar puncture attempted but was not successful.  Empiric antibiotics started for possible meningitis admit for further work-up.  Review of Systems: As per HPI, rest all negative.   Past Medical History:  Diagnosis Date  . Arthritis   . Cancer [facility])    b cell leukemia  1999 , non hodkins lymphoma 2002  . GERD (gastroesophageal reflux disease)    borderline bleeding ulcer   . History of hiatal hernia   . Urticaria   . Vertigo     Past Surgical History:  Procedure Laterality Date  . ANTERIOR CERVICAL DECOMP/DISCECTOMY FUSION N/A 10/14/2015   Procedure: ANTERIOR CERVICAL DECOMPRESSION/DISCECTOMY FUSION 1 LEVEL;  Surgeon: Phylliss Bob, MD;   Location: [place];  Service: Orthopedics;  Laterality: N/A;  Anterior cervical decompression fusion, cerivcal 4-5 with instrumentation and allograft  . CHOLECYSTECTOMY     2006  . HERNIA REPAIR     umbilical [id number]  . SHOULDER ARTHROSCOPY WITH BICEPSTENOTOMY Right 04/17/2016   Procedure: SHOULDER ARTHROSCOPY WITH BICEPSTENOTOMY;  Surgeon: Tania Ade, MD;  Location: [place];  Service: Orthopedics;  Laterality: Right;  . SHOULDER ARTHROSCOPY WITH ROTATOR CUFF REPAIR AND SUBACROMIAL DECOMPRESSION Right 04/17/2016   Procedure: SHOULDER ARTHROSCOPY ROTATOR CUFF TEAR AND SUBACROMIAL DECOMPRESSION AND BICEPS TENOTOMY;  Surgeon: Tania Ade, MD;  Location: [place];  Service: Orthopedics;  Laterality: Right;  RIGHT SHOULDER ARTHROSCOPY ROTATOR CUFF TEAR AND SUBACROMIAL DECOMPRESSION  . spleenectomy 1999    . strangulated umbilical hernia [id number]    . TONSILLECTOMY     left tonsil     reports that she has never smoked. She has never used smokeless tobacco. She reports current alcohol use. She reports that she does not use drugs.  Allergies  Allergen Reactions  . Cephalosporins Other (See Comments)    ALLERGY to PCN with immediate rash, facial/tongue/throat swelling, SOB, lightheadedness with hypotension.  . Contrast Media [Iodinated Diagnostic Agents] Hives and Shortness Of Breath  . Penicillins Hives    Has patient had a PCN reaction causing immediate rash, facial/tongue/throat swelling, SOB or lightheadedness with hypotension: Yes Has patient had a PCN reaction causing severe rash involving mucus  membranes or skin necrosis: No Has patient had a PCN reaction that required hospitalization No Has patient had a PCN reaction occurring within the last 10 years: No If all of the above answers are "NO", then may proceed with Cephalosporin use.  Marland Kitchen Amoxicillin Hives  . Codeine Nausea And Vomiting  . Keflex [Cephalexin] Hives  . Iodine Rash    History reviewed. No  pertinent family history.  Prior to Admission medications   Medication Sig Start Date End Date Taking? Authorizing Provider  aspirin 81 MG tablet Take 81 mg by mouth daily.    [provider]  atorvastatin (LIPITOR) 40 MG tablet Take 40 mg by mouth at bedtime.    [provider]  azelastine (ASTELIN) 0.1 % nasal spray Place 2 sprays into both nostrils 2 (two) times daily. 04/22/18   Bobbitt, Sed[place] Muta, MD  Biotin (BIOTIN MAXIMUM STRENGTH) 10 MG TABS Take 10 mg by mouth daily.    [provider]  Calcium Carb-Cholecalciferol (CALCIUM 600 + D PO) Take 1 tablet by mouth daily.    [provider]  Carbinoxamine Maleate 4 MG TABS Take 1 tablet (4 mg total) by mouth every 8 (eight) hours as needed. 04/22/18   Bobbitt, [place] Muta, MD  clobetasol cream (TEMOVATE) [id number] % Apply 1 application topically daily as needed (rash).    [provider]  docusate sodium (COLACE) 100 MG capsule Take 1 capsule (100 mg total) by mouth 3 (three) times daily as needed. Patient not taking: Reported on 04/22/2018 04/17/16   Grier Mitts, PA-C  fluticasone [facility]) 50 MCG/ACT nasal spray Place 2 sprays into both nostrils daily. 04/30/18   Bobbitt, [place] Muta, MD  irbesartan (AVAPRO) 300 MG tablet Take 300 mg by mouth at bedtime.    [provider]  metoprolol (LOPRESSOR) 50 MG tablet Take 50 mg by mouth daily.    [provider]  minoxidil (ROGAINE) 2 % external solution Apply 1 mL topically 1 day or 1 dose.    [provider]  Multiple Vitamin (MULTIVITAMIN WITH MINERALS) TABS tablet Take 1 tablet by mouth daily.    [provider]  omeprazole-sodium bicarbonate (ZEGERID) 40-1100 MG capsule Take 1 capsule by mouth daily before breakfast.    [provider]  oxyCODONE-acetaminophen (ROXICET) 5-325 MG tablet Take 1-2 tablets by mouth every 4 (four) hours as needed for severe pain. Patient not taking: Reported on 04/22/2018  04/17/16   Grier Mitts, PA-C  predniSONE (DELTASONE) 10 MG tablet 2 tablets daily for 4 days then 1 tablet on day 5 then STOP! 05/06/18   Bobbitt, [place] Muta, MD  ranitidine (ZANTAC) 150 MG tablet Take 1 tablet (150 mg total) by mouth at bedtime. 04/22/18   Bobbitt, Sedalia Muta, MD  vitamin C (ASCORBIC ACID) 500 MG tablet Take 1,000 mg by mouth daily.    [provider]  vitamin E 400 UNIT capsule Take 400 Units by mouth daily.    [provider]    Physical Exam: Constitutional: Moderately built and nourished. Vitals:   01/23/20 2245 01/23/20 2300 01/23/20 2315 01/23/20 2345  BP:      Pulse: (!) 103 100 100 (!) 102  Resp: (!) 24 (!) 31 19 17   Temp:      TempSrc:      SpO2: 95% 98% 97% 99%   Eyes: Anicteric no pallor. ENMT: No discharge from the ears eyes nose or mouth. Neck: Neck rigidity positive. Respiratory: No rhonchi or crepitations. Cardiovascular: S1-S2 heard. Abdomen: Soft  nontender bowel sounds present. Musculoskeletal: No edema. Skin: Some bruise on the left thigh. Neurologic: Patient is confused does not follow commands pupils react to light.  No further neurological assessment possible. Psychiatric: Patient is encephalopathic.   Labs on Admission: I have personally reviewed following labs and imaging studies  CBC: Recent Labs  Lab 01/23/20 2030 01/23/20 2039  WBC 78.9*  --   NEUTROABS 64.4*  --   HGB 12.3 12.9  HCT 37.5 38.0  MCV 83.1  --   PLT 172  --    Basic Metabolic Panel: Recent Labs  Lab 01/23/20 2030 01/23/20 2039  NA 142 140  K 4.0 3.5  CL 107 109  CO2 18*  --   GLUCOSE 150* 147*  BUN 53* 47*  CREATININE 3.09* 3.20*  CALCIUM 8.8*  --    GFR: CrCl cannot be calculated (Unknown ideal weight.). Liver Function Tests: Recent Labs  Lab 01/23/20 2030  AST 161*  ALT 240*  ALKPHOS 120  BILITOT 0.7  PROT 6.4*  ALBUMIN 3.4*   Recent Labs  Lab 01/23/20 2030  LIPASE 13   No results for input(s): AMMONIA in  the last 168 hours. Coagulation Profile: Recent Labs  Lab 01/23/20 2030  INR 1.3*   Cardiac Enzymes: No results for input(s): CKTOTAL, CKMB, CKMBINDEX, TROPONINI in the last 168 hours. BNP (last 3 results) No results for input(s): PROBNP in the last 8760 hours. HbA1C: No results for input(s): HGBA1C in the last 72 hours. CBG: No results for input(s): GLUCAP in the last 168 hours. Lipid Profile: No results for input(s): CHOL, HDL, LDLCALC, TRIG, CHOLHDL, LDLDIRECT in the last 72 hours. Thyroid Function Tests: No results for input(s): TSH, T4TOTAL, FREET4, T3FREE, THYROIDAB in the last 72 hours. Anemia Panel: No results for input(s): VITAMINB12, FOLATE, FERRITIN, TIBC, IRON, RETICCTPCT in the last 72 hours. Urine analysis:    Component Value Date/Time   COLORURINE AMBER (A) 01/23/2020 2117   APPEARANCEUR CLOUDY (A) 01/23/2020 2117   LABSPEC 1.018 01/23/2020 2117   PHURINE 5.0 01/23/2020 2117   GLUCOSEU NEGATIVE 01/23/2020 2117   HGBUR NEGATIVE 01/23/2020 2117   BILIRUBINUR NEGATIVE 01/23/2020 2117   [place] NEGATIVE 01/23/2020 2117   PROTEINUR 100 (A) 01/23/2020 2117   NITRITE NEGATIVE 01/23/2020 2117   LEUKOCYTESUR TRACE (A) 01/23/2020 2117   Sepsis Labs: @LABRCNTIP (procalcitonin:4,lacticidven:4) )No results found for this or any previous visit (from the past 240 hour(s)).   Radiological Exams on Admission: CT Abdomen Pelvis Wo Contrast  Result Date: 01/23/2020 CLINICAL DATA:  Patient appears jaundiced history of lymphoma EXAM: CT ABDOMEN AND PELVIS WITHOUT CONTRAST TECHNIQUE: Multidetector CT imaging of the abdomen and pelvis was performed following the standard protocol without IV contrast. COMPARISON:  None. FINDINGS: Lower chest: The visualized heart size within normal limits. No pericardial fluid/thickening. No hiatal hernia. The visualized portions of the lungs are clear. Hepatobiliary: Although limited due to the lack of intravenous contrast, normal in appearance  without gross focal abnormality. The patient is status post cholecystectomy. No biliary ductal dilation. Pancreas:  Unremarkable.  No surrounding inflammatory changes. 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 Adrenals/Urinary Tract: Both adrenal glands appear normal. The kidneys and collecting system appear normal without evidence of urinary tract calculus or hydronephrosis. Bladder is unremarkable. Stomach/Bowel: The stomach, small bowel, and colon are normal in appearance. No inflammatory changes or obstructive findings. Scattered colonic diverticula are noted without diverticulitis. Appendix is normal. Vascular/Lymphatic: There are no enlarged abdominal or  pelvic lymph nodes. Scattered aortic atherosclerotic calcifications are seen without aneurysmal dilatation. Reproductive: The uterus and adnexa are unremarkable. Other: No evidence of abdominal wall mass or hernia. Musculoskeletal: No acute or significant osseous findings. IMPRESSION: No acute intra-abdominal or pelvic pathology to explain the patient's symptoms. Diverticulosis without diverticulitis. Aortic Atherosclerosis (ICD10-I70.0). Electronically Signed   By: Prudencio Pair M.D.   On: 01/23/2020 22:03   CT Head Wo Contrast  Result Date: 01/23/2020 CLINICAL DATA:  Altered mental status. Left facial droop. EXAM: CT HEAD WITHOUT CONTRAST TECHNIQUE: Contiguous axial images were obtained from the base of the skull through the vertex without intravenous contrast. COMPARISON:  Brain MRI 11/24/2019 FINDINGS: Brain: No acute hemorrhage. No extra-axial or subdural collection. Mild generalized atrophy with moderate periventricular and deep white matter hypodensity consistent with chronic small vessel ischemia. This appears slightly asymmetric in the right cerebral hemisphere but no definite cortical infarct. Small calcification in left basal ganglia, likely senescent. No midline shift, hydrocephalus or  suspicious mass effect. Vascular: Atherosclerosis of skullbase vasculature without hyperdense vessel or abnormal calcification. Skull: No fracture or focal lesion. Sinuses/Orbits: Chronic paranasal sinus disease with near complete opacification of frontal sinuses, ethmoid air cells, left sphenoid and maxillary sinus. Mucosal thickening in the right sphenoid and maxillary sinus. The degree sinus opacification has slightly progressed from prior MRI. Mastoid air cells are clear. Other: None. IMPRESSION: 1. No hemorrhage or evidence of acute infarct. 2. Mild atrophy with moderate chronic small vessel ischemia. 3. Chronic paranasal sinus disease, slightly progressed from January MRI. Electronically Signed   By: Keith Rake M.D.   On: 01/23/2020 22:02   DG Chest Port 1 View  Result Date: 01/23/2020 CLINICAL DATA:  69 year old female with sepsis. EXAM: PORTABLE CHEST 1 VIEW COMPARISON:  Chest radiographs 12/19/2018 and earlier. FINDINGS: Portable AP semi upright view at 2045 hours. Larger lung volumes. Cardiac size at the upper limits of normal. Calcified aortic atherosclerosis. Other mediastinal contours are within normal limits. Visualized tracheal air column is within normal limits. Allowing for portable technique the lungs are clear. Chronic left upper quadrant surgical clips and cervical ACDF. Negative visible bowel gas pattern. No acute osseous abnormality identified. IMPRESSION: No acute cardiopulmonary abnormality. Aortic Atherosclerosis (ICD10-I70.0). Electronically Signed   By: Genevie Ann M.D.   On: 01/23/2020 20:55    EKG: Independently reviewed.  Sinus tachycardia with LVH.  Assessment/Plan Principal Problem:   Sepsis ([place]) Active Problems:   Essential hypertension   Acute encephalopathy    1. Sepsis with concern for meningitis/encephalitis for which I have ordered MRI.  Discussed with neurology about the MRI does not show any acute stroke.  Since ER physician attempted lumbar puncture and  was not successful will consult fluoroscopy.  Lumbar puncture.  Presently on empiric antibiotics for meningitis/encephalitis.  Follow cultures.  Check EEG.  Neurology has been consulted. 2. Significant leukocytosis -with history of promyelocytic leukemia Dr. Burney Gauze oncology has been consulted.  Oncologist at this time feels that patient's symptoms may be related to infection.  They will be giving the recommendations.  Repeat CBC.  Follow cultures.  On antibiotics. 3. History of hypertension we will keep patient on as needed IV labetalol for now. 4. History of GERD. 5. Elevated LFTs cause not clear.  Check acute hepatitis panel and follow LFTs.  Could be from sepsis. 6. Acute encephalopathy could be from meningitis encephalitis or sepsis.  See #1. 7. Acute renal failure probably sepsis related.  Continue hydration follow metabolic panel intake output. 8. History  of leukemia.  See #2. 9. Recent follow-up with oncology at [facility] was concerning for interstitial lung disease.  Given the septic nature of patient's presentation with encephalopathy will need close monitoring for further worsening in inpatient status.  Addendum -able to reach patient's husband and no changes in the history at this time.  As per the husband there was no new medications or any recent travel.  Patient has been lethargic over the last 24 hours.  Patient has been updated about her plan and patient's condition.   DVT prophylaxis: SCDs for now but avoiding anticoagulation since patient may need procedure. Code Status: Full code. Family Communication: Patient's husband. Disposition Plan: To be determined. Consults called: Neurology and hematology. Admission status: Inpatient.   Rise Patience MD [facility] Pager [phone].  If 7PM-7AM, please contact night-coverage [email or web address] Password [facility]  01/24/2020, 12:39 AM

## 2020-01-24 NOTE — Progress Notes (Signed)
Patient received from Ed for needs of continuation of care. On assessment patient does not respond to commands, does not open her eyes, patient is resstless pulling at line and tele monitor. ST on monitor, patient required 2L of oxygen via [place] to maintain sat of 92%. BP 141/64, respiration rate 35 per min. Respiration are labored,  shallow.  MEWS score 6 at this time. Per protocol will text page attending provider to make aware. Rounding nurse called.   @1620 , text paged answered by Raiford Noble DO, with order for 593ml bolus of NS, and 1mg  of ativan IVP for agitation.   Rounding nurse unable to assess patient at this time.  Will continue monitoring VS per MEWS protocol.

## 2020-01-24 NOTE — ED Notes (Signed)
Patient transported to MRI 

## 2020-01-24 NOTE — ED Notes (Signed)
Dr Rory Percy in to see patient

## 2020-01-24 NOTE — Progress Notes (Signed)
EEG complete - results pending 

## 2020-01-25 DIAGNOSIS — R4182 Altered mental status, unspecified: Secondary | ICD-10-CM

## 2020-01-25 DIAGNOSIS — G039 Meningitis, unspecified: Secondary | ICD-10-CM

## 2020-01-25 LAB — MAGNESIUM
Magnesium: 2.4 mg/dL (ref 1.7–2.4)
Magnesium: 2.2 mg/dL (ref 1.7–2.4)

## 2020-01-25 LAB — CBC WITH DIFFERENTIAL/PLATELET
Abs Immature Granulocytes: 1.55 10*3/uL — ABNORMAL HIGH (ref 0.00–0.07)
Abs Immature Granulocytes: 1.02 10*3/uL — ABNORMAL HIGH (ref 0.00–0.07)
Basophils Absolute: 0 10*3/uL (ref 0.0–0.1)
Basophils Absolute: 0.2 10*3/uL — ABNORMAL HIGH (ref 0.0–0.1)
Basophils Relative: 0 %
Basophils Relative: 0 %
Eosinophils Absolute: 0 10*3/uL (ref 0.0–0.5)
Eosinophils Absolute: 0 10*3/uL (ref 0.0–0.5)
Eosinophils Relative: 0 %
Eosinophils Relative: 0 %
HCT: 39.1 % (ref 36.0–46.0)
HCT: 38 % (ref 36.0–46.0)
Hemoglobin: 12.4 g/dL (ref 12.0–15.0)
Hemoglobin: 12.6 g/dL (ref 12.0–15.0)
Immature Granulocytes: 3 %
Immature Granulocytes: 2 %
Lymphocytes Relative: 2 %
Lymphocytes Relative: 2 %
Lymphs Abs: 1.2 10*3/uL (ref 0.7–4.0)
Lymphs Abs: 1.2 10*3/uL (ref 0.7–4.0)
MCH: 27.1 pg (ref 26.0–34.0)
MCH: 27 pg (ref 26.0–34.0)
MCHC: 31.7 g/dL (ref 30.0–36.0)
MCHC: 33.2 g/dL (ref 30.0–36.0)
MCV: 85.4 fL (ref 80.0–100.0)
MCV: 81.5 fL (ref 80.0–100.0)
Monocytes Absolute: 7.8 10*3/uL — ABNORMAL HIGH (ref 0.1–1.0)
Monocytes Absolute: 6 10*3/uL — ABNORMAL HIGH (ref 0.1–1.0)
Monocytes Relative: 13 %
Monocytes Relative: 12 %
Neutro Abs: 51.5 10*3/uL — ABNORMAL HIGH (ref 1.7–7.7)
Neutro Abs: 42.9 10*3/uL — ABNORMAL HIGH (ref 1.7–7.7)
Neutrophils Relative %: 82 %
Neutrophils Relative %: 84 %
Platelets: 134 10*3/uL — ABNORMAL LOW (ref 150–400)
Platelets: 146 10*3/uL — ABNORMAL LOW (ref 150–400)
RBC: 4.58 MIL/uL (ref 3.87–5.11)
RBC: 4.66 MIL/uL (ref 3.87–5.11)
RDW: 17.8 % — ABNORMAL HIGH (ref 11.5–15.5)
RDW: 17.3 % — ABNORMAL HIGH (ref 11.5–15.5)
WBC: 62.1 10*3/uL (ref 4.0–10.5)
WBC: 51.3 10*3/uL (ref 4.0–10.5)
nRBC: 0 % (ref 0.0–0.2)
nRBC: 0 % (ref 0.0–0.2)

## 2020-01-25 LAB — COMPREHENSIVE METABOLIC PANEL
ALT: 113 U/L — ABNORMAL HIGH (ref 0–44)
ALT: 86 U/L — ABNORMAL HIGH (ref 0–44)
AST: 39 U/L (ref 15–41)
AST: 22 U/L (ref 15–41)
Albumin: 2.5 g/dL — ABNORMAL LOW (ref 3.5–5.0)
Albumin: 2.4 g/dL — ABNORMAL LOW (ref 3.5–5.0)
Alkaline Phosphatase: 150 U/L — ABNORMAL HIGH (ref 38–126)
Alkaline Phosphatase: 123 U/L (ref 38–126)
Anion gap: 11 (ref 5–15)
Anion gap: 12 (ref 5–15)
BUN: 31 mg/dL — ABNORMAL HIGH (ref 8–23)
BUN: 26 mg/dL — ABNORMAL HIGH (ref 8–23)
CO2: 17 mmol/L — ABNORMAL LOW (ref 22–32)
CO2: 19 mmol/L — ABNORMAL LOW (ref 22–32)
Calcium: 8.2 mg/dL — ABNORMAL LOW (ref 8.9–10.3)
Calcium: 8.4 mg/dL — ABNORMAL LOW (ref 8.9–10.3)
Chloride: 119 mmol/L — ABNORMAL HIGH (ref 98–111)
Chloride: 117 mmol/L — ABNORMAL HIGH (ref 98–111)
Creatinine, Ser: 0.93 mg/dL (ref 0.44–1.00)
Creatinine, Ser: 0.73 mg/dL (ref 0.44–1.00)
GFR calc Af Amer: >60 mL/min (ref >60)
GFR calc Af Amer: >60 mL/min (ref >60)
GFR calc non Af Amer: >60 mL/min (ref >60)
GFR calc non Af Amer: >60 mL/min (ref >60)
Glucose, Bld: 140 mg/dL — ABNORMAL HIGH (ref 70–99)
Glucose, Bld: 156 mg/dL — ABNORMAL HIGH (ref 70–99)
Potassium: 3.7 mmol/L (ref 3.5–5.1)
Potassium: 3.9 mmol/L (ref 3.5–5.1)
Sodium: 147 mmol/L — ABNORMAL HIGH (ref 135–145)
Sodium: 148 mmol/L — ABNORMAL HIGH (ref 135–145)
Total Bilirubin: 1.1 mg/dL (ref 0.3–1.2)
Total Bilirubin: 1.3 mg/dL — ABNORMAL HIGH (ref 0.3–1.2)
Total Protein: 5.5 g/dL — ABNORMAL LOW (ref 6.5–8.1)
Total Protein: 5.5 g/dL — ABNORMAL LOW (ref 6.5–8.1)

## 2020-01-25 LAB — PHOSPHORUS
Phosphorus: 1.8 mg/dL — ABNORMAL LOW (ref 2.5–4.6)
Phosphorus: 2.1 mg/dL — ABNORMAL LOW (ref 2.5–4.6)

## 2020-01-25 LAB — GLUCOSE, CAPILLARY
Glucose-Capillary: 121 mg/dL — ABNORMAL HIGH (ref 70–99)
Glucose-Capillary: 115 mg/dL — ABNORMAL HIGH (ref 70–99)
Glucose-Capillary: 141 mg/dL — ABNORMAL HIGH (ref 70–99)

## 2020-01-25 LAB — URINE CULTURE: Culture: NO GROWTH

## 2020-01-25 MED ORDER — CHLORHEXIDINE GLUCONATE 0.12 % MT SOLN
15.0000 mL | Freq: Two times a day (BID) | OROMUCOSAL | Status: DC
  Administered 2020-01-25 – 2020-02-05 (×22): 15 mL via OROMUCOSAL
  Filled 2020-01-25 (×22): qty 15

## 2020-01-25 MED ORDER — SODIUM CHLORIDE 0.9 % IV SOLN
2.0000 g | Freq: Three times a day (TID) | INTRAVENOUS | Status: DC
  Administered 2020-01-25 – 2020-01-28 (×9): 2 g via INTRAVENOUS
  Filled 2020-01-25 (×11): qty 2

## 2020-01-25 MED ORDER — DEXTROSE 5 % IV SOLN
610.0000 mg | Freq: Three times a day (TID) | INTRAVENOUS | Status: DC
  Filled 2020-01-25 (×2): qty 12.2

## 2020-01-25 MED ORDER — POTASSIUM CL IN DEXTROSE 5% 20 MEQ/L IV SOLN
20.0000 meq | INTRAVENOUS | Status: DC
  Administered 2020-01-25 – 2020-01-26 (×2): 20 meq via INTRAVENOUS
  Filled 2020-01-25 (×2): qty 1000

## 2020-01-25 MED ORDER — SODIUM BICARBONATE 8.4 % IV SOLN
25.0000 meq | Freq: Once | INTRAVENOUS | Status: AC
  Administered 2020-01-25: 10:00:00 25 meq via INTRAVENOUS
  Filled 2020-01-25: qty 50

## 2020-01-25 MED ORDER — ORAL CARE MOUTH RINSE
15.0000 mL | Freq: Two times a day (BID) | OROMUCOSAL | Status: DC
  Administered 2020-01-25 – 2020-02-05 (×20): 15 mL via OROMUCOSAL

## 2020-01-25 MED ORDER — VANCOMYCIN HCL 750 MG/150ML IV SOLN
750.0000 mg | Freq: Two times a day (BID) | INTRAVENOUS | Status: DC
  Administered 2020-01-25 – 2020-01-27 (×5): 750 mg via INTRAVENOUS
  Filled 2020-01-25 (×6): qty 150

## 2020-01-25 MED ORDER — ENOXAPARIN SODIUM 40 MG/0.4ML ~~LOC~~ SOLN
40.0000 mg | SUBCUTANEOUS | Status: DC
  Administered 2020-01-25 – 2020-01-28 (×4): 40 mg via SUBCUTANEOUS
  Filled 2020-01-25 (×4): qty 0.4

## 2020-01-25 MED ORDER — POTASSIUM PHOSPHATES 15 MMOLE/5ML IV SOLN
20.0000 mmol | Freq: Once | INTRAVENOUS | Status: AC
  Administered 2020-01-25: 16:00:00 20 mmol via INTRAVENOUS
  Filled 2020-01-25: qty 6.67

## 2020-01-25 NOTE — Progress Notes (Addendum)
Neurology Progress Note   S:// Seen and examined, remains very agitated and only responsive to painful stimuli. No seizure activity reported overnight Review of systems not obtainable due to altered mental status  O:// Current vital signs: BP (!) 163/85 (BP Location: Right Arm)   Pulse 96   Temp 99 F (37.2 C) (Axillary)   Resp (!) 26   Wt 79.8 kg   SpO2 96%   BMI 32.97 kg/m  Vital signs in last 24 hours: Temp:  [97.4 F (36.3 C)-99 F (37.2 C)] 99 F (37.2 C) (03/28 0754) Pulse Rate:  [96-122] 96 (03/28 0754) Resp:  [25-38] 26 (03/28 0754) BP: (105-163)/(53-95) 163/85 (03/28 0754) SpO2:  [87 %-98 %] 96 % (03/28 0754) Weight:  [79.8 kg] 79.8 kg (03/28 0404) General: EEG leads are done to the head, is very uncomfortable in bed. HEENT: Normocephalic, atraumatic, dry oral mucous membranes-does have neck stiffness. CVs: Regular rhythm Respiratory: Breathing normally saturating well Abdomen: Nondistended nontender Extremities: There is a lot of marbling seen on the right thigh and leg-almost looking like cutis marmorata but it is worse on the right leg in comparison to be left. Neurological exam Awake but lethargic, appears agitated, severely dysarthric, able to say her name in a very dysarthric tone. Very poor attention concentration and does not follow verbal commands. Cranial nerves: Pupils are equal round reactive light, shows roving eye movements, does not appear to have any restricted gaze/extraocular movements, facial symmetry is off-it seems like she has flattening left nasolabial fold and drooping of the left angle of the mouth. Motor exam: She appears to be moving all extremities spontaneously but she is moving the right more than left.  On noxious stimulation she withdraws right upper and right lower extremity much more briskly than left lower extremity and has been minimal withdrawal in the left upper extremity.  She does moan to noxious stimulation all over Sensory  exam: Moaning and grimacing along with withdrawal as documented above Unable to assess coordination due to mental status Unable to assess gait due to mental status  Medications  Current Facility-Administered Medications:  .  acetaminophen (TYLENOL) tablet 650 mg, 650 mg, Oral, Q6H PRN **OR** acetaminophen (TYLENOL) suppository 650 mg, 650 mg, Rectal, Q6H PRN, Rise Patience, MD, 650 mg at 01/24/20 0107 .  acyclovir (ZOVIRAX) 680 mg in dextrose 5 % 100 mL IVPB, 680 mg, Intravenous, Q24H, Erenest Blank, RPH, Stopped at 01/25/20 0240 .  dextrose 5 % with KCl 20 mEq / L  infusion, 20 mEq, Intravenous, Continuous, Sheikh, Georgina Quint Kelley, DO, Stopped at 01/24/20 2253 .  haloperidol lactate (HALDOL) injection 2 mg, 2 mg, Intravenous, Q6H PRN, Raiford Noble Latif, DO, 2 mg at 01/25/20 0500 .  labetalol (NORMODYNE) injection 10 mg, 10 mg, Intravenous, Q2H PRN, Rise Patience, MD, 10 mg at 01/25/20 0309 .  levalbuterol (XOPENEX) nebulizer solution 0.63 mg, 0.63 mg, Nebulization, Q6H PRN, Sheikh, Omair Latif, DO .  levETIRAcetam (KEPPRA) 750 mg in sodium chloride 0.9 % 100 mL IVPB, 750 mg, Intravenous, Q12H, Amie Portland, MD, Last Rate: 430 mL/hr at 01/25/20 0820, 750 mg at 01/25/20 0820 .  LORazepam (ATIVAN) injection 1 mg, 1 mg, Intravenous, Once PRN, Sheikh, Omair Latif, DO .  meropenem (MERREM) 1 g in sodium chloride 0.9 % 100 mL IVPB, 1 g, Intravenous, Q12H, Rise Patience, MD, Stopped at 01/24/20 2325 .  ondansetron (ZOFRAN) tablet 4 mg, 4 mg, Oral, Q6H PRN **OR** ondansetron (ZOFRAN) injection 4 mg, 4 mg, Intravenous, Q6H  PRN, Rise Patience, MD .  sodium bicarbonate injection 25 mEq, 25 mEq, Intravenous, Once, Sheikh, Omair Latif, DO .  vancomycin variable dose per unstable renal function (pharmacist dosing), , Does not apply, See admin instructions, Rise Patience, MD Labs CBC    Component Value Date/Time   WBC 76.4 (HH) 01/24/2020 1819   RBC 4.69 01/24/2020 1819    HGB 12.8 01/24/2020 1819   HCT 38.3 01/24/2020 1819   PLT 143 (L) 01/24/2020 1819   MCV 81.7 01/24/2020 1819   MCH 27.3 01/24/2020 1819   MCHC 33.4 01/24/2020 1819   RDW 17.2 (H) 01/24/2020 1819   LYMPHSABS 0.9 01/24/2020 1819   MONOABS 9.6 (H) 01/24/2020 1819   EOSABS 0.0 01/24/2020 1819   BASOSABS 0.0 01/24/2020 1819    CMP     Component Value Date/Time   NA 147 (H) 01/25/2020 0324   K 3.7 01/25/2020 0324   CL 119 (H) 01/25/2020 0324   CO2 17 (L) 01/25/2020 0324   GLUCOSE 140 (H) 01/25/2020 0324   BUN 31 (H) 01/25/2020 0324   CREATININE 0.93 01/25/2020 0324   CALCIUM 8.2 (L) 01/25/2020 0324   PROT 5.5 (L) 01/25/2020 0324   ALBUMIN 2.5 (L) 01/25/2020 0324   AST 39 01/25/2020 0324   ALT 113 (H) 01/25/2020 0324   ALKPHOS 150 (H) 01/25/2020 0324   BILITOT 1.1 01/25/2020 0324   GFRNONAA >60 01/25/2020 0324   GFRAA >60 01/25/2020 0324   Imaging I have reviewed images in epic and the results pertinent to this consultation are: MRI examination of the brain done on admission was limited due to some susceptibility artifact emanating from the scalp but it did reveal diffuse FLAIR hyperintensity throughout the cerebral sulci and surrounding brainstem and cerebellum-concerning for meningitis. Moderate cerebral white matter disease and microvascular ischemic changes.  Extensive pansinusitis.  Assessment:  69 year old ,past history significant for leukemia, non-Hodgkin's lymphoma, hiatal hernia, arthritis presented to the emergency department with altered mental status, fever significantly elevated leukocytosis and a rightward gaze preference on admission. Examination with altered mental status, neck stiffness imaging suggestive of possible meningitis.  Bedside spinal tap attempted by ED provider failed.  Was extremely agitated unable to tolerate fluoroscopic guided LP per primary hospitalist. EEG with sharp waves from the left frontotemporal region, intermittent rhythmic delta  activity in the left frontal region and continuous slowing Which is generalized and lateralized to the right hemisphere.  There is evidence of cortical dysfunction in both cerebral hemispheres.  No seizures noted.  -Meningoencephalitis remains top of the differential - likely bacterial and less likely viral -EEG not suggestive of nonconvulsive status epilepticus but shows cortical irritability bilaterally -Other etiologies include toxic metabolic encephalopathy in the setting of sepsis  Recommendations: -Continue Keppra 750 twice daily -Reattempt spinal tap under fluoroscopy guidance.  Please plan on ordering CSF glucose, CSF protein, CSF cell count, CSF Gram stain, CSF culture, CSF HSV PCR, cytology and flow cytometry.  Also send for fungal cultures. - broad antibiotic coverage for bacterial meningitis- can d/c antiviral but send HSV PCR In CSF once obtained -Maintain seizure precautions -Once stable, obtain brain MRI with contrast. -Treatment of sepsis per primary team. Will discuss with the primary hospitalist  -- Amie Portland, MD [facility] Pager: [phone] If 7pm to 7am, please call on call as listed on AMION.

## 2020-01-25 NOTE — Progress Notes (Signed)
LTM maint complete - no skin breakdown under:   fp1, f3, f7, m1

## 2020-01-25 NOTE — Progress Notes (Signed)
I really should not be surprised that she is growing strep pneumonia in her blood.  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  This puts her at high risk for infection by encapsulated organisms.  She is being followed by neurology.  Have not yet seen a lumbar puncture on her.  She did have an EEG done.  No CBC has been done today.  Yesterday, her white cell count was about the same.  She still has a predominance of polys.  Neurologically she is still really is not all that much better.  She is afebrile.  Her blood pressure is 163/85.  I do think that a lumbar puncture will be important.  One does have to wonder if she may not have strep meningitis.  She does have sinusitis on the MRI.  I know that she is on broad-spectrum coverage with antibiotics which I would think would cover strep pneumonia.  I am not sure when she had her last strep pneumonia vaccine.  Lattie Haw, MD  John 12:13

## 2020-01-25 NOTE — Progress Notes (Signed)
Pharmacy Antibiotic Note  Kathleen Duran is a 69 y.o. female admitted on 01/23/2020 with r/o meningitis .  Patient was started on empiric vanc/merrem/acyclovir to r/o meningitis/encephalitis. Her blood cultures came back with strep pneu in 3/4 bottles. She is also asplenic so she is at highr risk for these encapsulated organisms.   D/w Dr Rory Percy today and we will dc acyclovir. We will cont vanc and merrem.    Her AKI has resolved so we will adjust the vanc and merrem. We will use vanc trough in this case rather than AUC.   Plan: Dc acyclovir Change vanc 750mg  IV q12 Change Merrem to 2g IV q12 Level as needed  Temp (24hrs), Avg:98.5 F (36.9 C), Min:97.4 F (36.3 C), Max:99 F (37.2 C)  Recent Labs  Lab 01/23/20 2029 01/23/20 2030 01/23/20 2039 01/24/20 0416 01/24/20 0802 01/24/20 1819 01/25/20 0324 01/25/20 0839  WBC  --  78.9*  --   --  71.4* 76.4*  --  62.1*  CREATININE  --  3.09* 3.20* 2.17*  --  1.28* 0.93  --   LATICACIDVEN 1.6  --   --   --   --   --   --   --     Estimated Creatinine Clearance: 55.8 mL/min (by C-G formula based on SCr of 0.93 mg/dL).    Allergies  Allergen Reactions  . Cephalosporins Other (See Comments)    ALLERGY to PCN with immediate rash, facial/tongue/throat swelling, SOB, lightheadedness with hypotension.  . Contrast Media [Iodinated Diagnostic Agents] Hives and Shortness Of Breath  . Penicillins Hives    Has patient had a PCN reaction causing immediate rash, facial/tongue/throat swelling, SOB or lightheadedness with hypotension: Yes Has patient had a PCN reaction causing severe rash involving mucus membranes or skin necrosis: No Has patient had a PCN reaction that required hospitalization No Has patient had a PCN reaction occurring within the last 10 years: No If all of the above answers are "NO", then may proceed with Cephalosporin use.  Marland Kitchen Amoxicillin Hives  . Codeine Nausea And Vomiting  . Keflex [Cephalexin] Hives  . Iodine Rash    Antimicrobials:  3/26 vanc>> 3/26 merrem>> 3/27 acyclovir>>3/28  Microbiology: 3/26 urine>>neg 3/26 blood>>3/4 strep pneumo   Onnie Boer, PharmD, BCIDP, AAHIVP, CPP Infectious Disease Pharmacist 01/25/2020 9:44 AM

## 2020-01-25 NOTE — Procedures (Addendum)
Patient Name:Attie P Lonzo [id number] Epilepsy Attending:Ramsay Bognar Barbra Sarks Referring Physician/Provider:Dr Amie Portland Duration: 01/25/2020 0942 to 01/26/2020 1035 Total duration of study: 49 hours  Patient [id number] y.o.femalewith past medical history significant for leukemia, non-Hodgkin's lymphoma 2002 presentedwith altered mental status, fever, significantly elevated leukocytosis, with a rightward gaze preference. EEG to evaluate for status epilepticus.  Level of alertness:awake, asleep  AEDs during EEG study:Ativan, Keppra  Technical aspects: This EEG study was done with scalp electrodes positioned according to the 10-20 International system of electrode placement. Electrical activity was acquired at a sampling rate of 500Hz  and reviewed with a high frequency filter of 70Hz  and a low frequency filter of 1Hz . EEG data were recorded continuously and digitally stored.  DESCRIPTION:During awake state, no clear posterior dominant rhythm was seen. Sleep was characterized by vertex waves, sleep spindles ( 12-14hz ), maximal frontocentral. EEG showed continuous generalized 5-8Hz  theta-alpha activity  with intermittent 2-3hz  delta activity, at times sharply contoured. Hyperventilation and photic stimulation were not performed.  ABNORMALITY - Continuous slow, generalized   IMPRESSION: This study issuggestive of moderate diffuse encephalopathy, non specific to etiology.No definite seizures were seen throughout the recording.  EEG appears to be improving compared to previous day.  Manisha Cancel Barbra Sarks

## 2020-01-25 NOTE — Progress Notes (Signed)
Will need to check with fluoroscopy in the morning if the patient is more stable-less agitated and if can be done without any sedation.  Otherwise we will have to figure out to do under anesthesia.  I would hate to intubate her only for obtaining an LP but if that is the only choice for it-we will do that. In the meantime, she is covered with empiric meningitic coverage adequately. In addition to regular infectious work-up, I would also do cytology and flow cytometry when CSF is obtained.  We will follow  -- Amie Portland, MD [facility] Pager: [phone] If 7pm to 7am, please call on call as listed on AMION.

## 2020-01-25 NOTE — Progress Notes (Signed)
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  History is not able to be obtained from the patient given her current condition but is reported that she is not feeling well for last 24 hours and has been running a fever and has been vomiting..  In the ED she is found to be febrile and septic with a temperature of 101, tachycardic as well as an elevated white blood cell count of 78,000.  She is also found to have an acute kidney injury with a creatinine of 3.09.  She did have positive neck rigidity and a CT of the head and CT of the abdomen were done which were unremarkable.  Also noted that she had acute liver injury with elevated LFTs.  Chest x-ray did not show anything and COVID-19 testing was negative.  Urinalysis did show some hyaline casts and some WBCs 11-20 with trace leukocytes.  Lumbar puncture was attempted twice in the ED however was unsuccessful but nonetheless she is started on empiric antibiotics for possible meningitis and admitted for further work-up.  MRI was done and was technically limited but there was diffuse fluid hyperdensity throughout the cerebral sulcal and surrounding brainstem and cerebellum.  While this finding could be artificial nature related to overlying susceptibility artifact however proteinaceous material and changes related meningitis could not be excluded and an LP is to be done but because this is unsuccessful at the bedside will be done under fluoroscopy and I received approval from Dr. Posey Pronto the radiologist for this.  She will be going for her LP soon.  Neurology was consulted and  they have ordered a EEG as well and patient was started on meningitis treatment with vancomycin, meropenem as well as acyclovir.  Her WBC is improving as well as her renal function.  Liver enzymes are also trending down slightly and patient continues to have a metabolic acidosis.  Because of her history of promyelocytic leukemia oncology is consulted and they have no further recommendations at this time from a hematological standpoint.  She is improving from a laboratory perspective and a little more awake today but was significantly agitated.  Blood cultures are growing Streptococcus pneumonia and will continue on IV meropenem and IV vancomycin.  Assessment & Plan:   Principal Problem:   Sepsis ([place]) Active Problems:   Essential hypertension   Acute encephalopathy   Meningitis  Sepsis with concern for meningitis/encephalitis from Streptococcus pneumonia with complications including acute encephalopathy as well as acute renal failure and acute liver injury -MRI as above.  -Dr. Hal Hope Discussed with neurology about the MRI does not show any acute stroke.  -Since ER physician attempted lumbar puncture and was not successful will consult fluoroscopy forLumbar puncture which is still pending to be done as the patient was unable to lay still and will need to attempt under general anesthesia likely -Presently on empiric antibiotics for meningitis/encephalitis and have added Acyvlovir but this was stopped -Continue empiric antibiotics until LP with IV meropenem and IV vancomycin -Follow cultures blood cultures 3 out of 4 to coccus pneumonia -Check EEG and neurology recommended long-term EEG -Neurology has been consulted. -Blood Cx  showed 3/4 + Cx for Streptococcus Pneumoniae -WBC is now trending down now from 76.4 and now 51.3 -Neurology was consulted and recommended Keppra 750 twice daily -Continue seizure precautions next-when she is more stable will obtain a brain MRI with  contrast -Underwent EEG monitoring and is not suggestive of nonconvulsive status epilepticus but does show cortical irritable ability bilaterally -Also went under LTM -Further care per neurology  Significant Leukocytosis -With history of promyelocytic leukemia Dr. Burney Gauze oncology has been consulted.  -Oncologist at this time feels that patient's symptoms may be related to infection. -C/w Abx -trending down  History of hypertension  -We will keep patient on as needed IV labetalol for now.  History of GERD.  Elevated LFTs , acute liver injury in the setting of sepsis -In the setting of Sepsis, improving -AST is now 22 and ALT is now 86 and improved from admission -Check acute hepatitis panel and follow LFTs.   Acute encephalopathy -Could be from meningitis encephalitis or sepsis. See #1. -He is also hyponatremic and we are working on improving this with D5W +20 mEq of KCl at 75 mL's per hour -patient sodium is now 148 -continue with EEG as per neurology and will need a lumbar puncture and may need to attempt this under general anesthesia given that she is continuingly agitated now  Acute kidney injury Metabolic acidosis  -Related to sepsis  -Continue hydration follow metabolic panel intake output. -Improving as patient's BUN/creatinine is now 26/0.73 -Given an Amp of Sodium Bicarbonate -Patient's CO2 is now 19, chloride is 117, anion gap is 12 -Avoid nephrotoxic medications, contrast dyes, hypotension and renally adjust medications -repeat CMP in a.m.  Hyperbilirubinemia -Mild -Patient T Vergia Alcon is now 1.3 -continue monitor and trend -Repeat CMP in a.m.  History of leukemia. See #2.  Thrombocytopenia -In the setting of sepsis likely reactive next-patient's platelet count is now improving from 126,000 to 146,000 -Continue to Monitor for S/Sx of Bleeding -Repeat CBC in AM   Hypophosphatemia -Patient's phosphorus level was 2.1 -Replete with IV  K-Phos -continue to monitor and replete as necessary  -Repeat Phos in a.m.  Obesity -Estimated body mass index is 30.54 kg/m as calculated from the following:   Height as of this encounter: 5' 2.64" (1.591 m).   Weight as of this encounter: 77.3 kg. -Will provide Weight Loss and Dietary Counseling when more awake  DVT prophylaxis: SCDs; Will add Enoxaparin 40 mg sq Code Status: FULL CODE   Family Communication: Discussed with Husband  Disposition Plan: Ending further clinical improvement and will need PT OT evaluations and lumbar puncture as well as clearance by neurology prior to discharge  Consultants:   Neurology  Oncology   Procedures:  EEG LTM   Antimicrobials:  Anti-infectives (From admission, onward)   Start     Dose/Rate Route Frequency Ordered Stop   01/25/20 1200  acyclovir (ZOVIRAX) 610 mg in dextrose 5 % 100 mL IVPB  Status:  Discontinued     610 mg 112.2 mL/hr over 60 Minutes Intravenous Every 8 hours 01/25/20 0924 01/25/20 0933   01/25/20 1000  meropenem (MERREM) 2 g in sodium chloride 0.9 % 100 mL IVPB     2 g 200 mL/hr over 30 Minutes Intravenous Every 8 hours 01/25/20 0919     01/25/20 1000  vancomycin (VANCOREADY) IVPB 750 mg/150 mL     750 mg 150 mL/hr over 60 Minutes Intravenous Every 12 hours 01/25/20 0919     01/24/20 0230  acyclovir (ZOVIRAX) 680 mg  in dextrose 5 % 100 mL IVPB  Status:  Discontinued     680 mg 113.6 mL/hr over 60 Minutes Intravenous Every 24 hours 01/24/20 0137 01/25/20 0924   01/23/20 2200  meropenem (MERREM) 1 g in sodium chloride 0.9 % 100 mL IVPB  Status:  Discontinued     1 g 200 mL/hr over 30 Minutes Intravenous Every 8 hours 01/23/20 2042 01/23/20 2044   01/23/20 2200  meropenem (MERREM) 2 g in sodium chloride 0.9 % 100 mL IVPB  Status:  Discontinued     2 g 200 mL/hr over 30 Minutes Intravenous Every 8 hours 01/23/20 2044 01/23/20 2052   01/23/20 2100  meropenem (MERREM) 1 g in sodium chloride 0.9 % 100 mL IVPB  Status:   Discontinued     1 g 200 mL/hr over 30 Minutes Intravenous Every 12 hours 01/23/20 2052 01/25/20 0919   01/23/20 2055  vancomycin variable dose per unstable renal function (pharmacist dosing)  Status:  Discontinued      Does not apply See admin instructions 01/23/20 2056 01/25/20 0934   01/23/20 2045  vancomycin (VANCOREADY) IVPB 1750 mg/350 mL     1,750 mg 175 mL/hr over 120 Minutes Intravenous  Once 01/23/20 2042 01/24/20 0010     Subjective: Seen and examined at bedside and she is encephalopathic still but slightly more alert and does open her eyes to verbal commands.  Still extremely agitated and thrashing around the bed.  Nursing reports no other concerns.  Husband thinks that she is getting better.  No other concerns or complaints at this time  Objective: Vitals:   01/25/20 1600 01/25/20 1604 01/25/20 1704 01/25/20 1804  BP:  (!) 162/76 (!) 163/79 (!) 160/86  Pulse:  (!) 102 (!) 105 (!) 102  Resp:  (!) 33 (!) 31   Temp: 99.6 F (37.6 C)     TempSrc: Rectal     SpO2:  97% 97% 100%  Weight:      Height:        Intake/Output Summary (Last 24 hours) at 01/25/2020 1943 Last data filed at 01/25/2020 1800 Gross per 24 hour  Intake 2996.15 ml  Output 400 ml  Net 2596.15 ml   Filed Weights   01/25/20 0404 01/25/20 0900  Weight: 79.8 kg 77.3 kg   Examination: Physical Exam:  Constitutional: WN/WD obese female currently in some mild distress appears uncomfortable and she is thrashing on the bed and agitated  Eyes: Lids and conjunctivae normal, sclerae anicteric  ENMT: External Ears, Nose appear normal. Grossly normal hearing. Neck: Appears normal, supple, no cervical masses, normal ROM, no appreciable thyromegaly; no JVD Respiratory: Diminished to auscultation bilaterally, no wheezing, rales, rhonchi or crackles.  Slight tachypneic but is not wearing any supplemental oxygen via nasal cannula saturating well Cardiovascular: Tachycardic rate but regular rhythm, no murmurs / rubs  / gallops. S1 and S2 auscultated.  Trace extremity edema.   Abdomen: Soft, non-tender, distended secondary body habitus. Bowel sounds positive x4.  GU: Deferred. Musculoskeletal: No clubbing / cyanosis of digits/nails. Normal strength and muscle tone.  Skin: No rashes, lesions, ulcers on limited skin evaluation but does have a birthmark on her right leg. No induration; Warm and dry.  Neurologic: Remains encephalopathic and does not follow commands but does open her eyes physical and verbal stimuli to her name. Psychiatric: Impaired judgment and insight.  She is not oriented x 3.  Seems agitated mood and appropriate affect.   Data Reviewed: I have personally reviewed following labs  and imaging studies  CBC: Recent Labs  Lab 01/23/20 2030 01/23/20 2030 01/23/20 2039 01/24/20 0802 01/24/20 1819 01/25/20 0839 01/25/20 1804  WBC 78.9*  --   --  71.4* 76.4* 62.1* 51.3*  NEUTROABS 64.4*  --   --  57.7* 62.6* 51.5* 42.9*  HGB 12.3   < > 12.9 12.4 12.8 12.4 12.6  HCT 37.5   < > 38.0 37.8 38.3 39.1 38.0  MCV 83.1  --   --  83.6 81.7 85.4 81.5  PLT 172  --   --  126* 143* 134* 146*   < > = values in this interval not displayed.   Basic Metabolic Panel: Recent Labs  Lab 01/23/20 2030 01/23/20 2030 01/23/20 2039 01/24/20 0416 01/24/20 1819 01/25/20 0324 01/25/20 0839 01/25/20 1804  NA 142   < > 140 145 149* 147*  --  148*  K 4.0   < > 3.5 4.3 3.0* 3.7  --  3.9  CL 107   < > 109 111 117* 119*  --  117*  CO2 18*  --   --  17* 19* 17*  --  19*  GLUCOSE 150*   < > 147* 133* 115* 140*  --  156*  BUN 53*   < > 47* 44* 36* 31*  --  26*  CREATININE 3.09*   < > 3.20* 2.17* 1.28* 0.93  --  0.73  CALCIUM 8.8*  --   --  8.4* 8.2* 8.2*  --  8.4*  MG  --   --   --   --  1.5*  --  2.4 2.2  PHOS  --   --   --   --  2.7  --  1.8* 2.1*   < > = values in this interval not displayed.   GFR: Estimated Creatinine Clearance: 64.9 mL/min (by C-G formula based on SCr of 0.73 mg/dL). Liver Function  Tests: Recent Labs  Lab 01/23/20 2030 01/24/20 0416 01/24/20 1819 01/25/20 0324 01/25/20 1804  AST 161* 120* 55* 39 22  ALT 240* 198* 143* 113* 86*  ALKPHOS 120 164* 120 150* 123  BILITOT 0.7 1.2 1.1 1.1 1.3*  PROT 6.4* 6.1* 5.8* 5.5* 5.5*  ALBUMIN 3.4* 3.2* 2.7* 2.5* 2.4*   Recent Labs  Lab 01/23/20 2030  LIPASE 13   No results for input(s): AMMONIA in the last 168 hours. Coagulation Profile: Recent Labs  Lab 01/23/20 2030  INR 1.3*   Cardiac Enzymes: No results for input(s): CKTOTAL, CKMB, CKMBINDEX, TROPONINI in the last 168 hours. BNP (last 3 results) No results for input(s): PROBNP in the last 8760 hours. HbA1C: No results for input(s): HGBA1C in the last 72 hours. CBG: Recent Labs  Lab 01/24/20 0800 01/24/20 1429 01/25/20 0623 01/25/20 1257 01/25/20 1735  GLUCAP 147* 108* 121* 115* 141*   Lipid Profile: No results for input(s): CHOL, HDL, LDLCALC, TRIG, CHOLHDL, LDLDIRECT in the last 72 hours. Thyroid Function Tests: No results for input(s): TSH, T4TOTAL, FREET4, T3FREE, THYROIDAB in the last 72 hours. Anemia Panel: No results for input(s): VITAMINB12, FOLATE, FERRITIN, TIBC, IRON, RETICCTPCT in the last 72 hours. Sepsis Labs: Recent Labs  Lab 01/23/20 2029  LATICACIDVEN 1.6    Recent Results (from the past 240 hour(s))  Blood Culture (routine x 2)     Status: Abnormal (Preliminary result)   Collection Time: 01/23/20  8:29 PM   Specimen: BLOOD LEFT HAND  Result Value Ref Range Status   Specimen Description BLOOD LEFT HAND  Final  Special Requests   Final    BOTTLES DRAWN AEROBIC AND ANAEROBIC Blood Culture adequate volume   Culture  Setup Time   Final    GRAM POSITIVE COCCI IN PAIRS IN BOTH AEROBIC AND ANAEROBIC BOTTLES CRITICAL RESULT CALLED TO, READ BACK BY AND VERIFIED WITH: T. BAUMEISTER, PHARMD AT 1120 ON 01/24/20 BY C. JESSUP, MT.    Culture (A)  Final    STREPTOCOCCUS PNEUMONIAE SUSCEPTIBILITIES TO FOLLOW Performed at [facility], [place] [address] [place], [place] [address]    Report Status PENDING  Incomplete  Blood Culture (routine x 2)     Status: Abnormal (Preliminary result)   Collection Time: 01/23/20  8:55 PM   Specimen: BLOOD RIGHT HAND  Result Value Ref Range Status   Specimen Description BLOOD RIGHT HAND  Final   Special Requests   Final    BOTTLES DRAWN AEROBIC AND ANAEROBIC Blood Culture results may not be optimal due to an inadequate volume of blood received in culture bottles   Culture  Setup Time   Final    GRAM POSITIVE COCCI IN PAIRS IN BOTH AEROBIC AND ANAEROBIC BOTTLES CRITICAL RESULT CALLED TO, READ BACK BY AND VERIFIED WITH: T. BAUMEISTER, PHARMD AT 1120 ON 01/24/20 BY C. JESSUP, MT.    Culture (A)  Final    STREPTOCOCCUS PNEUMONIAE SUSCEPTIBILITIES TO FOLLOW Performed at [facility], [place] [address] [place], [place] [address]    Report Status PENDING  Incomplete  Blood Culture ID Panel (Reflexed)     Status: Abnormal   Collection Time: 01/23/20  8:55 PM  Result Value Ref Range Status   Enterococcus species NOT DETECTED NOT DETECTED Final   Listeria monocytogenes NOT DETECTED NOT DETECTED Final   Staphylococcus species NOT DETECTED NOT DETECTED Final   Staphylococcus aureus (BCID) NOT DETECTED NOT DETECTED Final   Streptococcus species DETECTED (A) NOT DETECTED Final    Comment: CRITICAL RESULT CALLED TO, READ BACK BY AND VERIFIED WITH: T. BAUMEISTER, PHARMD AT 1120 ON 01/24/20 BY C. JESSUP, MT.    Streptococcus agalactiae NOT DETECTED NOT DETECTED Final   Streptococcus pneumoniae DETECTED (A) NOT DETECTED Final    Comment: CRITICAL RESULT CALLED TO, READ BACK BY AND VERIFIED WITH: T. BAUMEISTER, PHARMD AT 1120 ON 01/24/20 BY C. JESSUP, MT.    Streptococcus pyogenes NOT DETECTED NOT DETECTED Final   Acinetobacter baumannii NOT DETECTED NOT DETECTED Final   Enterobacteriaceae species NOT DETECTED NOT DETECTED Final   Enterobacter cloacae complex NOT DETECTED NOT DETECTED  Final   Escherichia coli NOT DETECTED NOT DETECTED Final   Klebsiella oxytoca NOT DETECTED NOT DETECTED Final   Klebsiella pneumoniae NOT DETECTED NOT DETECTED Final   Proteus species NOT DETECTED NOT DETECTED Final   Serratia marcescens NOT DETECTED NOT DETECTED Final   Haemophilus influenzae NOT DETECTED NOT DETECTED Final   Neisseria meningitidis NOT DETECTED NOT DETECTED Final   Pseudomonas aeruginosa NOT DETECTED NOT DETECTED Final   Candida albicans NOT DETECTED NOT DETECTED Final   Candida glabrata NOT DETECTED NOT DETECTED Final   Candida krusei NOT DETECTED NOT DETECTED Final   Candida parapsilosis NOT DETECTED NOT DETECTED Final   Candida tropicalis NOT DETECTED NOT DETECTED Final    Comment: Performed at [facility], [place]. [address], [place], [place] [address]  Urine culture     Status: None   Collection Time: 01/23/20  9:17 PM   Specimen: In/Out Cath Urine  Result Value Ref Range Status   Specimen  Description IN/OUT CATH URINE  Final   Special Requests NONE  Final   Culture   Final    NO GROWTH Performed at [facility], [place] [address], [place], [place] [address]    Report Status 01/25/2020 FINAL  Final  SARS CORONAVIRUS 2 (TAT 6-24 HRS) Nasopharyngeal Nasopharyngeal Swab     Status: None   Collection Time: 01/23/20 11:06 PM   Specimen: Nasopharyngeal Swab  Result Value Ref Range Status   SARS Coronavirus 2 NEGATIVE NEGATIVE Final    Comment: (NOTE) SARS-CoV-2 target nucleic acids are NOT DETECTED. The SARS-CoV-2 RNA is generally detectable in upper and lower respiratory specimens during the acute phase of infection. Negative results do not preclude SARS-CoV-2 infection, do not rule out co-infections with other pathogens, and should not be used as the sole basis for treatment or other patient management decisions. Negative results must be combined with clinical observations, patient history, and epidemiological information. The expected result is  Negative. Fact Sheet for Patients: SugarRoll.be Fact Sheet for Healthcare Providers: https://www.woods-mathews.com/ This test is not yet approved or cleared by the [place] FDA and  has been authorized for detection and/or diagnosis of SARS-CoV-2 by FDA under an Emergency Use Authorization (EUA). This EUA will remain  in effect (meaning this test can be used) for the duration of the COVID-19 declaration under Section 56 4(b)(1) of the Act, 21 U.S.C. section 360bbb-3(b)(1), unless the authorization is terminated or revoked sooner. Performed at [facility], [place] [address], [place], [place] [address]      RN Pressure Injury Documentation:     Estimated body mass index is 30.54 kg/m as calculated from the following:   Height as of this encounter: 5' 2.64" (1.591 m).   Weight as of this encounter: 77.3 kg.  Malnutrition Type:      Malnutrition Characteristics:      Nutrition Interventions:     Radiology Studies: CT Abdomen Pelvis Wo Contrast  Result Date: 01/23/2020 CLINICAL DATA:  Patient appears jaundiced history of lymphoma EXAM: CT ABDOMEN AND PELVIS WITHOUT CONTRAST TECHNIQUE: Multidetector CT imaging of the abdomen and pelvis was performed following the standard protocol without IV contrast. COMPARISON:  None. FINDINGS: Lower chest: The visualized heart size within normal limits. No pericardial fluid/thickening. No hiatal hernia. The visualized portions of the lungs are clear. Hepatobiliary: Although limited due to the lack of intravenous contrast, normal in appearance without gross focal abnormality. The patient is status post cholecystectomy. No biliary ductal dilation. Pancreas:  Unremarkable.  No surrounding inflammatory changes. 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 Adrenals/Urinary Tract: Both adrenal glands appear normal. The kidneys and collecting system  appear normal without evidence of urinary tract calculus or hydronephrosis. Bladder is unremarkable. Stomach/Bowel: The stomach, small bowel, and colon are normal in appearance. No inflammatory changes or obstructive findings. Scattered colonic diverticula are noted without diverticulitis. Appendix is normal. Vascular/Lymphatic: There are no enlarged abdominal or pelvic lymph nodes. Scattered aortic atherosclerotic calcifications are seen without aneurysmal dilatation. Reproductive: The uterus and adnexa are unremarkable. Other: No evidence of abdominal wall mass or hernia. Musculoskeletal: No acute or significant osseous findings. IMPRESSION: No acute intra-abdominal or pelvic pathology to explain the patient's symptoms. Diverticulosis without diverticulitis. Aortic Atherosclerosis (ICD10-I70.0). Electronically Signed   By: Prudencio Pair M.D.   On: 01/23/2020 22:03   CT Head Wo Contrast  Result Date: 01/23/2020 CLINICAL DATA:  Altered mental status. Left facial droop. EXAM: CT HEAD  WITHOUT CONTRAST TECHNIQUE: Contiguous axial images were obtained from the base of the skull through the vertex without intravenous contrast. COMPARISON:  Brain MRI 11/24/2019 FINDINGS: Brain: No acute hemorrhage. No extra-axial or subdural collection. Mild generalized atrophy with moderate periventricular and deep white matter hypodensity consistent with chronic small vessel ischemia. This appears slightly asymmetric in the right cerebral hemisphere but no definite cortical infarct. Small calcification in left basal ganglia, likely senescent. No midline shift, hydrocephalus or suspicious mass effect. Vascular: Atherosclerosis of skullbase vasculature without hyperdense vessel or abnormal calcification. Skull: No fracture or focal lesion. Sinuses/Orbits: Chronic paranasal sinus disease with near complete opacification of frontal sinuses, ethmoid air cells, left sphenoid and maxillary sinus. Mucosal thickening in the right sphenoid and  maxillary sinus. The degree sinus opacification has slightly progressed from prior MRI. Mastoid air cells are clear. Other: None. IMPRESSION: 1. No hemorrhage or evidence of acute infarct. 2. Mild atrophy with moderate chronic small vessel ischemia. 3. Chronic paranasal sinus disease, slightly progressed from January MRI. Electronically Signed   By: Keith Rake M.D.   On: 01/23/2020 22:02   MR BRAIN WO CONTRAST  Result Date: 01/24/2020 CLINICAL DATA:  Initial evaluation for acute encephalopathy, altered mental status. History of leukemia, in remission. EXAM: MRI HEAD WITHOUT CONTRAST TECHNIQUE: Multiplanar, multiecho pulse sequences of the brain and surrounding structures were obtained without intravenous contrast. COMPARISON:  Prior CT from 01/23/2020 as well as previous MRI from 11/24/2019. FINDINGS: Brain: Examination somewhat technically limited by susceptibility artifact emanating from the scalp. Generalized age-related cerebral atrophy. Patchy T2/FLAIR hyperintensity within the periventricular and deep white matter both cerebral hemispheres again seen, nonspecific, but most like related chronic microvascular ischemic disease, stable from previous. No abnormal foci of restricted diffusion to suggest acute or subacute ischemia. Gray-white matter differentiation maintained. No encephalomalacia to suggest chronic cortical infarction. Diffuse FLAIR hyperintensity seen throughout the cerebral sulci, suspected to be artifactual nature due to overlying susceptibility artifact. Possible proteinaceous material not entirely excluded. No associated susceptibility artifact to suggest subarachnoid hemorrhage. No mass lesion, midline shift, or mass effect. Ventricles normal size without hydrocephalus. No appreciable intraventricular debris. No extra-axial fluid collection. No made of an empty sella. Midline structures intact. Vascular: Major intracranial vascular flow voids are maintained. Skull and upper cervical  spine: Craniocervical junction within normal limits. Postsurgical changes partially visualize within the upper cervical spine. Bone marrow signal intensity heterogeneous and diffusely decreased on T1 weighted imaging, most like related history of leukemia. Susceptibility artifact involving the left greater than right scalp, of uncertain etiology. Sinuses/Orbits: Globes and orbital soft tissues demonstrate no acute finding. Extensive opacity and mucosal thickening seen throughout the paranasal sinuses, with superimposed right maxillary sinus air-fluid level. Findings could be infectious and/or inflammatory nature. Trace bilateral mastoid effusions. Visualized inner ear structures grossly normal. Other: None. IMPRESSION: 1. Technically limited exam due to susceptibility artifact emanating from the scalp, of uncertain etiology. 2. Diffuse FLAIR hyperintensity throughout the cerebral sulci and surrounding the brainstem and cerebellum. While this finding could be artifactual in nature related to overlying susceptibility artifact, proteinaceous material and changes related to meningitis could also have this appearance. Correlation with LP and CSF studies recommended for correlated purposes as clinically warranted. No susceptibility artifact to suggest subarachnoid hemorrhage. 3. 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 4. Extensive pan sinusitis. This could be either infectious or inflammatory in nature. Electronically Signed   By: Jeannine Boga M.D.   On: 01/24/2020 06:46  DG CHEST PORT 1 VIEW  Result Date: 01/24/2020 CLINICAL DATA:  Shortness of breath and hypoxia. EXAM: PORTABLE CHEST 1 VIEW COMPARISON:  Radiograph yesterday. FINDINGS: Lower lung volumes from prior exam. Prominent heart size likely accentuated by technique. Bronchovascular crowding versus vascular congestion. No significant pleural effusion. No pneumothorax.  No confluent airspace disease. No acute osseous abnormalities are seen. IMPRESSION: Lower lung volumes from prior exam. Bronchovascular crowding versus vascular congestion. Prominent heart size likely accentuated by technique and low lung volumes. Electronically Signed   By: Keith Rake M.D.   On: 01/24/2020 19:19   DG Chest Port 1 View  Result Date: 01/23/2020 CLINICAL DATA:  69 year old female with sepsis. EXAM: PORTABLE CHEST 1 VIEW COMPARISON:  Chest radiographs 12/19/2018 and earlier. FINDINGS: Portable AP semi upright view at 2045 hours. Larger lung volumes. Cardiac size at the upper limits of normal. Calcified aortic atherosclerosis. Other mediastinal contours are within normal limits. Visualized tracheal air column is within normal limits. Allowing for portable technique the lungs are clear. Chronic left upper quadrant surgical clips and cervical ACDF. Negative visible bowel gas pattern. No acute osseous abnormality identified. IMPRESSION: No acute cardiopulmonary abnormality. Aortic Atherosclerosis (ICD10-I70.0). Electronically Signed   By: Genevie Ann M.D.   On: 01/23/2020 20:55   EEG adult  Result Date: 01/24/2020 Lora Havens, MD     01/24/2020 10:59 AM Patient Name: SARAELIZABETH BOLLENBACH MRN: [id number] Epilepsy Attending: Lora Havens Referring Physician/Provider: Dr Amie [place] Date: 01/24/2020 Duration: 24.09 mins Patient history: 70 y.o.femalewith past medical history significant for leukemia, non-Hodgkin's lymphoma 2002 presented with altered mental status, fever, significantly elevated leukocytosis, with a rightward gaze preference. EEG to evaluate for status epilepticus. Level of alertness: lethargic AEDs during EEG study: Ativan, Keppra Technical aspects: This EEG study was done with scalp electrodes positioned according to the 10-20 International system of electrode placement. Electrical activity was acquired at a sampling rate of 500Hz  and reviewed with a high frequency filter  of 70Hz  and a low frequency filter of 1Hz . EEG data were recorded continuously and digitally stored. DESCRIPTION: EEG showed continuous 3-4Hz  delta slowing in right hemisphere. There is also continuous 5-8Hz  theta-alpha activity in left hemisphere with intermittent 2-3hz  delta activity..  Hyperventilation and photic stimulation were not performed. ABNORMALITY - Continuous slow, generalized and lateralized right hemisphere IMPRESSION: This study is suggestive of cortical dysfunction in right hemisphere likely secondary to post-ictal state, underlying structural abnormality.  No seizures or definite epileptiform discharges were seen throughout the recording. Dr Rory Percy was notified. Priyanka Barbra Sarks   Overnight EEG with video  Result Date: 01/24/2020 Lora Havens, MD     01/25/2020 10:07 AM Patient Name: KINZLEY ROSTRO MRN: [id number] Epilepsy Attending: Lora Havens Referring Physician/Provider: Dr Amie Portland Duration: 01/24/2020 [id number] to 01/25/2020 [id number]  Patient history: 69 y.o.femalewith past medical history significant for leukemia, non-Hodgkin's lymphoma 2002 presented with altered mental status, fever, significantly elevated leukocytosis, with a rightward gaze preference. EEG to evaluate for status epilepticus.  Level of alertness: lethargic  AEDs during EEG study: Ativan, Keppra  Technical aspects: This EEG study was done with scalp electrodes positioned according to the 10-20 International system of electrode placement. Electrical activity was acquired at a sampling rate of 500Hz  and reviewed with a high frequency filter of 70Hz  and a low frequency filter of 1Hz . EEG data were recorded continuously and digitally stored.  DESCRIPTION: EEG showed continuous 5-8Hz  theta-alpha activity in left hemisphere with intermittent 2-3hz  delta activity which at times appears  quasi rhythmic. Sharp waves were also noted in left frontal region. Continuous 3-4Hz  low amplitude delta slowing was also noted in  right hemisphere. Hyperventilation and photic stimulation were not performed.  ABNORMALITY - Sharp waves, left frontotemporal region - Intermittent rhythmic delta activity, left frontal region - Continuous slow, generalized and lateralized right hemisphere  IMPRESSION: This study is suggestive of epileptogenicity and cortical dysfunction in left frontotemporal region. Quasi rhythmic delta activity with sharp waves in left frontotemporal region can be on the ictal-interictal continuum. Additionally, there is evidence of cortical dysfunction in right hemisphere likely secondary to post-ictal state, underlying structural abnormality.  No definite seizures were seen throughout the recording. Priyanka Barbra Sarks   Scheduled Meds: . chlorhexidine  15 mL Mouth Rinse BID  . mouth rinse  15 mL Mouth Rinse q12n4p   Continuous Infusions: . dextrose 5 % with KCl 20 mEq / L 20 mEq (01/25/20 1601)  . levETIRAcetam 750 mg (01/25/20 0820)  . meropenem (MERREM) IV 2 g (01/25/20 1905)  . potassium PHOSPHATE IVPB (in mmol) 20 mmol (01/25/20 1600)  . vancomycin 750 mg (01/25/20 1010)    LOS: 2 days   Kerney Elbe, DO [facility] PAGER is on [place]  If 7PM-7AM, please contact night-coverage [email or web address]

## 2020-01-26 ENCOUNTER — Inpatient Hospital Stay (HOSPITAL_COMMUNITY): Payer: Medicare HMO

## 2020-01-26 DIAGNOSIS — E870 Hyperosmolality and hypernatremia: Secondary | ICD-10-CM

## 2020-01-26 HISTORY — PX: IR FLUORO GUIDED NEEDLE PLC ASPIRATION/INJECTION LOC: IMG2395

## 2020-01-26 LAB — CBC WITH DIFFERENTIAL/PLATELET
Abs Immature Granulocytes: 0 10*3/uL (ref 0.00–0.07)
Basophils Absolute: 0.3 10*3/uL — ABNORMAL HIGH (ref 0.0–0.1)
Basophils Relative: 1 %
Eosinophils Absolute: 1.7 10*3/uL — ABNORMAL HIGH (ref 0.0–0.5)
Eosinophils Relative: 5 %
HCT: 34 % — ABNORMAL LOW (ref 36.0–46.0)
Hemoglobin: 11.6 g/dL — ABNORMAL LOW (ref 12.0–15.0)
Lymphocytes Relative: 19 %
Lymphs Abs: 6.4 10*3/uL — ABNORMAL HIGH (ref 0.7–4.0)
MCH: 27 pg (ref 26.0–34.0)
MCHC: 34.1 g/dL (ref 30.0–36.0)
MCV: 79.3 fL — ABNORMAL LOW (ref 80.0–100.0)
Monocytes Absolute: 2.4 10*3/uL — ABNORMAL HIGH (ref 0.1–1.0)
Monocytes Relative: 7 %
Neutro Abs: 22.8 10*3/uL — ABNORMAL HIGH (ref 1.7–7.7)
Neutrophils Relative %: 68 %
Platelets: 179 10*3/uL (ref 150–400)
RBC: 4.29 MIL/uL (ref 3.87–5.11)
RDW: 16.7 % — ABNORMAL HIGH (ref 11.5–15.5)
WBC: 33.6 10*3/uL — ABNORMAL HIGH (ref 4.0–10.5)
nRBC: 0 % (ref 0.0–0.2)

## 2020-01-26 LAB — CSF CELL COUNT WITH DIFFERENTIAL
Eosinophils, CSF: 0 % (ref 0–1)
Lymphs, CSF: 2 % — ABNORMAL LOW (ref 40–80)
Monocyte-Macrophage-Spinal Fluid: 5 % — ABNORMAL LOW (ref 15–45)
RBC Count, CSF: 400 /mm3 — ABNORMAL HIGH
Segmented Neutrophils-CSF: 93 % — ABNORMAL HIGH (ref 0–6)
Tube #: 1
WBC, CSF: 30750 /mm3 (ref 0–5)

## 2020-01-26 LAB — GLUCOSE, CAPILLARY
Glucose-Capillary: 125 mg/dL — ABNORMAL HIGH (ref 70–99)
Glucose-Capillary: 130 mg/dL — ABNORMAL HIGH (ref 70–99)
Glucose-Capillary: 131 mg/dL — ABNORMAL HIGH (ref 70–99)
Glucose-Capillary: 143 mg/dL — ABNORMAL HIGH (ref 70–99)
Glucose-Capillary: 146 mg/dL — ABNORMAL HIGH (ref 70–99)

## 2020-01-26 LAB — CULTURE, BLOOD (ROUTINE X 2): Special Requests: ADEQUATE

## 2020-01-26 LAB — BILIRUBIN, TOTAL: Total Bilirubin: 1.4 mg/dL — ABNORMAL HIGH (ref 0.3–1.2)

## 2020-01-26 LAB — COMPREHENSIVE METABOLIC PANEL
ALT: 70 U/L — ABNORMAL HIGH (ref 0–44)
AST: 31 U/L (ref 15–41)
Albumin: 2.5 g/dL — ABNORMAL LOW (ref 3.5–5.0)
Alkaline Phosphatase: 117 U/L (ref 38–126)
Anion gap: 11 (ref 5–15)
BUN: 23 mg/dL (ref 8–23)
CO2: 19 mmol/L — ABNORMAL LOW (ref 22–32)
Calcium: 8.5 mg/dL — ABNORMAL LOW (ref 8.9–10.3)
Chloride: 118 mmol/L — ABNORMAL HIGH (ref 98–111)
Creatinine, Ser: 0.75 mg/dL (ref 0.44–1.00)
GFR calc Af Amer: >60 mL/min (ref >60)
GFR calc non Af Amer: >60 mL/min (ref >60)
Glucose, Bld: 135 mg/dL — ABNORMAL HIGH (ref 70–99)
Potassium: 4.4 mmol/L (ref 3.5–5.1)
Sodium: 148 mmol/L — ABNORMAL HIGH (ref 135–145)
Total Bilirubin: UNDETERMINED mg/dL (ref 0.3–1.2)
Total Protein: 5.5 g/dL — ABNORMAL LOW (ref 6.5–8.1)

## 2020-01-26 LAB — MAGNESIUM: Magnesium: 2.1 mg/dL (ref 1.7–2.4)

## 2020-01-26 LAB — PATHOLOGIST SMEAR REVIEW

## 2020-01-26 LAB — PROTEIN, CSF: Total  Protein, CSF: 292 mg/dL — ABNORMAL HIGH (ref 15–45)

## 2020-01-26 LAB — PHOSPHORUS: Phosphorus: 1.6 mg/dL — ABNORMAL LOW (ref 2.5–4.6)

## 2020-01-26 LAB — GLUCOSE, CSF: Glucose, CSF: <20 mg/dL — CL (ref 40–70)

## 2020-01-26 MED ORDER — ACETAMINOPHEN 500 MG PO TABS
1000.0000 mg | ORAL_TABLET | Freq: Four times a day (QID) | ORAL | Status: DC | PRN
  Administered 2020-01-29 – 2020-02-04 (×14): 1000 mg via ORAL
  Filled 2020-01-26 (×15): qty 2

## 2020-01-26 MED ORDER — FENTANYL CITRATE (PF) 100 MCG/2ML IJ SOLN
INTRAMUSCULAR | Status: AC | PRN
  Administered 2020-01-26: 25 ug via INTRAVENOUS

## 2020-01-26 MED ORDER — MIDAZOLAM HCL 2 MG/2ML IJ SOLN
INTRAMUSCULAR | Status: AC
  Filled 2020-01-26: qty 2

## 2020-01-26 MED ORDER — FENTANYL CITRATE (PF) 100 MCG/2ML IJ SOLN
INTRAMUSCULAR | Status: AC
  Filled 2020-01-26: qty 2

## 2020-01-26 MED ORDER — POTASSIUM PHOSPHATES 15 MMOLE/5ML IV SOLN
20.0000 mmol | Freq: Once | INTRAVENOUS | Status: AC
  Administered 2020-01-27: 01:00:00 20 mmol via INTRAVENOUS
  Filled 2020-01-26: qty 6.67

## 2020-01-26 MED ORDER — MIDAZOLAM HCL 2 MG/2ML IJ SOLN
INTRAMUSCULAR | Status: AC | PRN
  Administered 2020-01-26: 1 mg via INTRAVENOUS

## 2020-01-26 NOTE — Procedures (Signed)
  Procedure: LP under fluoro OP 13cmH2O; 49ml cloudy pale yellow fluid removed for labs EBL:   minimal Complications:  none immediate  See full dictation in [facility].  Dillard Cannon MD Main # [phone] Pager  [phone]

## 2020-01-26 NOTE — Care Management Important Message (Signed)
Important Message  Patient Details  Name: Kathleen Duran MRN: [id number] Date of Birth: 1951/04/21   Medicare Important Message Given:  Yes Patient  Not able to sign due to illness.  Unsigned copy left at patients bedside.     [name] 01/26/2020, 1:59 PM

## 2020-01-26 NOTE — Progress Notes (Signed)
Chief Complaint: Patient was seen in consultation today for acute encephalopathy/LP.  Referring Physician(s): Rise Patience  Supervising Physician: Arne Cleveland  Patient Status: [facility] - In-pt  History of Present Illness: Kathleen Duran is a 69 y.o. female with a past medical history of hypertension, vertigo, hiatal hernia, GERD, non-hodgkins lymphoma, and arthritis. She has been admitted to [facility] since 01/23/2020 for management of AMS. She was found to be septic with concerns for meningitis/encephalitis, without clear etiology for acute encephalopathy. Neurology was consulted who recommended EEG and LP. A LP was attempted both bedside by ED and under fluoroscopy without sedation, both unsuccessful secondary to patient's inability to sit still.  IR consulted by Dr. Hal Hope for possible image-guided LP with sedation. Patient laying in bed resting comfortably. She opens eyes to voice, answering some questions appropriately. Oriented to time only- aware it is 12/2019 but states she is "in [place]". History difficult to obtain secondary to AMS.    Past Medical History:  Diagnosis Date  . Arthritis   . Cancer [facility])    b cell leukemia  1999 , non hodkins lymphoma 2002  . GERD (gastroesophageal reflux disease)    borderline bleeding ulcer   . History of hiatal hernia   . Urticaria   . Vertigo     Past Surgical History:  Procedure Laterality Date  . ANTERIOR CERVICAL DECOMP/DISCECTOMY FUSION N/A 10/14/2015   Procedure: ANTERIOR CERVICAL DECOMPRESSION/DISCECTOMY FUSION 1 LEVEL;  Surgeon: Phylliss Bob, MD;  Location: [place];  Service: Orthopedics;  Laterality: N/A;  Anterior cervical decompression fusion, cerivcal 4-5 with instrumentation and allograft  . CHOLECYSTECTOMY     2006  . HERNIA REPAIR     umbilical [id number]  . SHOULDER ARTHROSCOPY WITH BICEPSTENOTOMY Right 04/17/2016   Procedure: SHOULDER ARTHROSCOPY WITH BICEPSTENOTOMY;  Surgeon: Tania Ade, MD;  Location:  [place];  Service: Orthopedics;  Laterality: Right;  . SHOULDER ARTHROSCOPY WITH ROTATOR CUFF REPAIR AND SUBACROMIAL DECOMPRESSION Right 04/17/2016   Procedure: SHOULDER ARTHROSCOPY ROTATOR CUFF TEAR AND SUBACROMIAL DECOMPRESSION AND BICEPS TENOTOMY;  Surgeon: Tania Ade, MD;  Location: [place];  Service: Orthopedics;  Laterality: Right;  RIGHT SHOULDER ARTHROSCOPY ROTATOR CUFF TEAR AND SUBACROMIAL DECOMPRESSION  . spleenectomy 1999    . strangulated umbilical hernia [id number]    . TONSILLECTOMY     left tonsil    Allergies: Cephalosporins, Contrast media [iodinated diagnostic agents], Penicillins, Amoxicillin, Codeine, Keflex [cephalexin], and Iodine  Medications: Prior to Admission medications   Medication Sig Start Date End Date Taking? Authorizing Provider  aspirin 81 MG tablet Take 81 mg by mouth daily.   Yes [provider]  atorvastatin (LIPITOR) 40 MG tablet Take 40 mg by mouth at bedtime.   Yes [provider]  Biotin (BIOTIN MAXIMUM STRENGTH) 10 MG TABS Take 10 mg by mouth daily.   Yes [provider]  Calcium Carb-Cholecalciferol (CALCIUM 600 + D PO) Take 1 tablet by mouth daily.   Yes [provider]  Calcium Polycarbophil (FIBER-CAPS PO) Take 1 capsule by mouth as needed (mild constipation).   Yes [provider]  clobetasol cream (TEMOVATE) [id number] % Apply 1 application topically daily as needed (rash).   Yes [provider]  Fluocinonide 0.1 % CREA Apply 1 application topically as needed. For irritation   Yes [provider]  fluticasone (FLONASE) 50 MCG/ACT nasal spray Place 2 sprays into both nostrils daily. 04/30/18  Yes Bobbitt, Sedalia Muta, MD  ipratropium (ATROVENT) 0.06 % nasal spray Place  2 sprays into the nose daily.   Yes [provider]  irbesartan (AVAPRO) 300 MG tablet Take 300 mg by mouth at bedtime.   Yes [provider]  metoprolol (LOPRESSOR) 50 MG  tablet Take 50 mg by mouth daily.   Yes [provider]  Multiple Vitamin (MULTIVITAMIN WITH MINERALS) TABS tablet Take 1 tablet by mouth daily.   Yes [provider]  mupirocin ointment (BACTROBAN) 2 % Apply 1 application topically as directed. Daily in nose 08/21/19  Yes [provider]  omeprazole-sodium bicarbonate (ZEGERID) 40-1100 MG capsule Take 1 capsule by mouth daily before breakfast.   Yes [provider]  Potassium 99 MG TABS Take 99 mg by mouth daily.   Yes [provider]  vitamin C (ASCORBIC ACID) 500 MG tablet Take 500 mg by mouth daily.    Yes [provider]  vitamin E 400 UNIT capsule Take 400 Units by mouth daily.   Yes [provider]  azelastine (ASTELIN) 0.1 % nasal spray Place 2 sprays into both nostrils 2 (two) times daily. Patient not taking: Reported on 01/24/2020 04/22/18   Bobbitt, Sedalia Muta, MD  Carbinoxamine Maleate 4 MG TABS Take 1 tablet (4 mg total) by mouth every 8 (eight) hours as needed. Patient not taking: Reported on 01/24/2020 04/22/18   Bobbitt, Sedalia Muta, MD  docusate sodium (COLACE) 100 MG capsule Take 1 capsule (100 mg total) by mouth 3 (three) times daily as needed. Patient not taking: Reported on 04/22/2018 04/17/16   Grier Mitts, PA-C  minoxidil (ROGAINE) 2 % external solution Apply 1 mL topically 1 day or 1 dose.    [provider]  oxyCODONE-acetaminophen (ROXICET) 5-325 MG tablet Take 1-2 tablets by mouth every 4 (four) hours as needed for severe pain. Patient not taking: Reported on 04/22/2018 04/17/16   Grier Mitts, PA-C  predniSONE (DELTASONE) 10 MG tablet 2 tablets daily for 4 days then 1 tablet on day 5 then STOP! Patient not taking: Reported on 01/24/2020 05/06/18   Bobbitt, Sedalia Muta, MD  ranitidine (ZANTAC) 150 MG tablet Take 1 tablet (150 mg total) by mouth at bedtime. Patient not taking: Reported on 01/24/2020 04/22/18   Bobbitt, Sedalia Muta, MD      History reviewed. No pertinent family history.  Social History   Socioeconomic History  . Marital status: Married    Spouse name: Not on file  . Number of children: Not on file  . Years of education: Not on file  . Highest education level: Not on file  Occupational History  . Not on file  Tobacco Use  . Smoking status: Never Smoker  . Smokeless tobacco: Never Used  Substance and Sexual Activity  . Alcohol use: Yes    Comment: rarely  . Drug use: No  . Sexual activity: Yes    Birth control/protection: Post-menopausal  Other Topics Concern  . Not on file  Social History Narrative  . Not on file   Social Determinants of Health   Financial Resource Strain:   . Difficulty of Paying Living Expenses:   Food Insecurity:   . Worried About Charity fundraiser in the Last Year:   . Arboriculturist in the Last Year:   Transportation Needs:   . Film/video editor (Medical):   Marland Kitchen Lack of Transportation (Non-Medical):   Physical Activity:   . Days of Exercise per Week:   . Minutes of Exercise per Session:   Stress:   . Feeling of  Stress :   Social Connections:   . Frequency of Communication with Friends and Family:   . Frequency of Social Gatherings with Friends and Family:   . Attends Religious Services:   . Active Member of Clubs or Organizations:   . Attends Archivist Meetings:   Marland Kitchen Marital Status:      Review of Systems: A 12 point ROS discussed and pertinent positives are indicated in the HPI above.  All other systems are negative.  Review of Systems  Unable to perform ROS: Mental status change    Vital Signs: BP (!) 168/106 (BP Location: Left Arm)   Pulse 99   Temp 98.4 F (36.9 C) (Oral)   Resp (!) 26   Ht 5' 2.64" (1.591 m)   Wt 179 lb 3.7 oz (81.3 kg)   SpO2 100%   BMI 32.12 kg/m   Physical Exam Vitals and nursing note reviewed.  Constitutional:      General: She is not in acute distress. Cardiovascular:     Rate and Rhythm: Normal  rate and regular rhythm.     Heart sounds: Normal heart sounds. No murmur.  Pulmonary:     Effort: Pulmonary effort is normal. No respiratory distress.     Breath sounds: Normal breath sounds. No wheezing.  Skin:    General: Skin is warm and dry.  Neurological:     Mental Status: She is alert.     Comments: Oriented to time only.      MD Evaluation Airway: WNL Heart: WNL Abdomen: WNL Chest/ Lungs: WNL ASA  Classification: 3 Mallampati/Airway Score: Two   Imaging: CT Abdomen Pelvis Wo Contrast  Result Date: 01/23/2020 CLINICAL DATA:  Patient appears jaundiced history of lymphoma EXAM: CT ABDOMEN AND PELVIS WITHOUT CONTRAST TECHNIQUE: Multidetector CT imaging of the abdomen and pelvis was performed following the standard protocol without IV contrast. COMPARISON:  None. FINDINGS: Lower chest: The visualized heart size within normal limits. No pericardial fluid/thickening. No hiatal hernia. The visualized portions of the lungs are clear. Hepatobiliary: Although limited due to the lack of intravenous contrast, normal in appearance without gross focal abnormality. The patient is status post cholecystectomy. No biliary ductal dilation. Pancreas:  Unremarkable.  No surrounding inflammatory changes. 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 Adrenals/Urinary Tract: Both adrenal glands appear normal. The kidneys and collecting system appear normal without evidence of urinary tract calculus or hydronephrosis. Bladder is unremarkable. Stomach/Bowel: The stomach, small bowel, and colon are normal in appearance. No inflammatory changes or obstructive findings. Scattered colonic diverticula are noted without diverticulitis. Appendix is normal. Vascular/Lymphatic: There are no enlarged abdominal or pelvic lymph nodes. Scattered aortic atherosclerotic calcifications are seen without aneurysmal dilatation. Reproductive: The uterus and adnexa are  unremarkable. Other: No evidence of abdominal wall mass or hernia. Musculoskeletal: No acute or significant osseous findings. IMPRESSION: No acute intra-abdominal or pelvic pathology to explain the patient's symptoms. Diverticulosis without diverticulitis. Aortic Atherosclerosis (ICD10-I70.0). Electronically Signed   By: Prudencio Pair M.D.   On: 01/23/2020 22:03   CT Head Wo Contrast  Result Date: 01/23/2020 CLINICAL DATA:  Altered mental status. Left facial droop. EXAM: CT HEAD WITHOUT CONTRAST TECHNIQUE: Contiguous axial images were obtained from the base of the skull through the vertex without intravenous contrast. COMPARISON:  Brain MRI 11/24/2019 FINDINGS: Brain: No acute hemorrhage. No extra-axial or subdural collection. Mild generalized atrophy with moderate periventricular and deep white matter hypodensity consistent with chronic small vessel ischemia.  This appears slightly asymmetric in the right cerebral hemisphere but no definite cortical infarct. Small calcification in left basal ganglia, likely senescent. No midline shift, hydrocephalus or suspicious mass effect. Vascular: Atherosclerosis of skullbase vasculature without hyperdense vessel or abnormal calcification. Skull: No fracture or focal lesion. Sinuses/Orbits: Chronic paranasal sinus disease with near complete opacification of frontal sinuses, ethmoid air cells, left sphenoid and maxillary sinus. Mucosal thickening in the right sphenoid and maxillary sinus. The degree sinus opacification has slightly progressed from prior MRI. Mastoid air cells are clear. Other: None. IMPRESSION: 1. No hemorrhage or evidence of acute infarct. 2. Mild atrophy with moderate chronic small vessel ischemia. 3. Chronic paranasal sinus disease, slightly progressed from January MRI. Electronically Signed   By: Keith Rake M.D.   On: 01/23/2020 22:02   MR BRAIN WO CONTRAST  Result Date: 01/24/2020 CLINICAL DATA:  Initial evaluation for acute encephalopathy,  altered mental status. History of leukemia, in remission. EXAM: MRI HEAD WITHOUT CONTRAST TECHNIQUE: Multiplanar, multiecho pulse sequences of the brain and surrounding structures were obtained without intravenous contrast. COMPARISON:  Prior CT from 01/23/2020 as well as previous MRI from 11/24/2019. FINDINGS: Brain: Examination somewhat technically limited by susceptibility artifact emanating from the scalp. Generalized age-related cerebral atrophy. Patchy T2/FLAIR hyperintensity within the periventricular and deep white matter both cerebral hemispheres again seen, nonspecific, but most like related chronic microvascular ischemic disease, stable from previous. No abnormal foci of restricted diffusion to suggest acute or subacute ischemia. Gray-white matter differentiation maintained. No encephalomalacia to suggest chronic cortical infarction. Diffuse FLAIR hyperintensity seen throughout the cerebral sulci, suspected to be artifactual nature due to overlying susceptibility artifact. Possible proteinaceous material not entirely excluded. No associated susceptibility artifact to suggest subarachnoid hemorrhage. No mass lesion, midline shift, or mass effect. Ventricles normal size without hydrocephalus. No appreciable intraventricular debris. No extra-axial fluid collection. No made of an empty sella. Midline structures intact. Vascular: Major intracranial vascular flow voids are maintained. Skull and upper cervical spine: Craniocervical junction within normal limits. Postsurgical changes partially visualize within the upper cervical spine. Bone marrow signal intensity heterogeneous and diffusely decreased on T1 weighted imaging, most like related history of leukemia. Susceptibility artifact involving the left greater than right scalp, of uncertain etiology. Sinuses/Orbits: Globes and orbital soft tissues demonstrate no acute finding. Extensive opacity and mucosal thickening seen throughout the paranasal sinuses, with  superimposed right maxillary sinus air-fluid level. Findings could be infectious and/or inflammatory nature. Trace bilateral mastoid effusions. Visualized inner ear structures grossly normal. Other: None. IMPRESSION: 1. Technically limited exam due to susceptibility artifact emanating from the scalp, of uncertain etiology. 2. Diffuse FLAIR hyperintensity throughout the cerebral sulci and surrounding the brainstem and cerebellum. While this finding could be artifactual in nature related to overlying susceptibility artifact, proteinaceous material and changes related to meningitis could also have this appearance. Correlation with LP and CSF studies recommended for correlated purposes as clinically warranted. No susceptibility artifact to suggest subarachnoid hemorrhage. 3. 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 4. Extensive pan sinusitis. This could be either infectious or inflammatory in nature. Electronically Signed   By: Jeannine Boga M.D.   On: 01/24/2020 06:46   DG CHEST PORT 1 VIEW  Result Date: 01/26/2020 CLINICAL DATA:  Leukocytosis EXAM: PORTABLE CHEST 1 VIEW COMPARISON:  01/24/2020 FINDINGS: The cardiac silhouette, mediastinal and hilar contours are stable. Low lung volumes with vascular crowding and streaky atelectasis. Slight improved lung aeration since the prior study with less  vascular congestion and resolving edema. No pleural effusions. IMPRESSION: 1. Low lung volumes with vascular crowding and streaky atelectasis. 2. Improved lung aeration. Electronically Signed   By: Marijo Sanes M.D.   On: 01/26/2020 07:34   DG CHEST PORT 1 VIEW  Result Date: 01/24/2020 CLINICAL DATA:  Shortness of breath and hypoxia. EXAM: PORTABLE CHEST 1 VIEW COMPARISON:  Radiograph yesterday. FINDINGS: Lower lung volumes from prior exam. Prominent heart size likely accentuated by technique. Bronchovascular crowding versus  vascular congestion. No significant pleural effusion. No pneumothorax. No confluent airspace disease. No acute osseous abnormalities are seen. IMPRESSION: Lower lung volumes from prior exam. Bronchovascular crowding versus vascular congestion. Prominent heart size likely accentuated by technique and low lung volumes. Electronically Signed   By: Keith Rake M.D.   On: 01/24/2020 19:19   DG Chest Port 1 View  Result Date: 01/23/2020 CLINICAL DATA:  69 year old female with sepsis. EXAM: PORTABLE CHEST 1 VIEW COMPARISON:  Chest radiographs 12/19/2018 and earlier. FINDINGS: Portable AP semi upright view at 2045 hours. Larger lung volumes. Cardiac size at the upper limits of normal. Calcified aortic atherosclerosis. Other mediastinal contours are within normal limits. Visualized tracheal air column is within normal limits. Allowing for portable technique the lungs are clear. Chronic left upper quadrant surgical clips and cervical ACDF. Negative visible bowel gas pattern. No acute osseous abnormality identified. IMPRESSION: No acute cardiopulmonary abnormality. Aortic Atherosclerosis (ICD10-I70.0). Electronically Signed   By: Genevie Ann M.D.   On: 01/23/2020 20:55   EEG adult  Result Date: 01/24/2020 Lora Havens, MD     01/24/2020 10:59 AM Patient Name: DYNASTY MEES MRN: [id number] Epilepsy Attending: Lora Havens Referring Physician/Provider: Dr Amie Portland Date: 01/24/2020 Duration: 24.09 mins Patient history: 69 y.o.femalewith past medical history significant for leukemia, non-Hodgkin's lymphoma 2002 presented with altered mental status, fever, significantly elevated leukocytosis, with a rightward gaze preference. EEG to evaluate for status epilepticus. Level of alertness: lethargic AEDs during EEG study: Ativan, Keppra Technical aspects: This EEG study was done with scalp electrodes positioned according to the 10-20 International system of electrode placement. Electrical activity was  acquired at a sampling rate of 500Hz  and reviewed with a high frequency filter of 70Hz  and a low frequency filter of 1Hz . EEG data were recorded continuously and digitally stored. DESCRIPTION: EEG showed continuous 3-4Hz  delta slowing in right hemisphere. There is also continuous 5-8Hz  theta-alpha activity in left hemisphere with intermittent 2-3hz  delta activity..  Hyperventilation and photic stimulation were not performed. ABNORMALITY - Continuous slow, generalized and lateralized right hemisphere IMPRESSION: This study is suggestive of cortical dysfunction in right hemisphere likely secondary to post-ictal state, underlying structural abnormality.  No seizures or definite epileptiform discharges were seen throughout the recording. Dr Rory Percy was notified. Priyanka Barbra Sarks   Overnight EEG with video  Result Date: 01/24/2020 Lora Havens, MD     01/25/2020 10:07 AM Patient Name: JUDEAN KANAK MRN: [id number] Epilepsy Attending: Lora Havens Referring Physician/Provider: Dr Amie Portland Duration: 01/24/2020 [id number] to 01/25/2020 [id number]  Patient history: 69 y.o.femalewith past medical history significant for leukemia, non-Hodgkin's lymphoma 2002 presented with altered mental status, fever, significantly elevated leukocytosis, with a rightward gaze preference. EEG to evaluate for status epilepticus.  Level of alertness: lethargic  AEDs during EEG study: Ativan, Keppra  Technical aspects: This EEG study was done with scalp electrodes positioned according to the 10-20 International system of electrode placement. Electrical activity was acquired at a sampling rate of 500Hz  and reviewed with a high  frequency filter of 70Hz  and a low frequency filter of 1Hz . EEG data were recorded continuously and digitally stored.  DESCRIPTION: EEG showed continuous 5-8Hz  theta-alpha activity in left hemisphere with intermittent 2-3hz  delta activity which at times appears quasi rhythmic. Sharp waves were also noted in left  frontal region. Continuous 3-4Hz  low amplitude delta slowing was also noted in right hemisphere. Hyperventilation and photic stimulation were not performed.  ABNORMALITY - Sharp waves, left frontotemporal region - Intermittent rhythmic delta activity, left frontal region - Continuous slow, generalized and lateralized right hemisphere  IMPRESSION: This study is suggestive of epileptogenicity and cortical dysfunction in left frontotemporal region. Quasi rhythmic delta activity with sharp waves in left frontotemporal region can be on the ictal-interictal continuum. Additionally, there is evidence of cortical dysfunction in right hemisphere likely secondary to post-ictal state, underlying structural abnormality.  No definite seizures were seen throughout the recording. Priyanka Barbra Sarks    Labs:  CBC: Recent Labs    01/24/20 1819 01/25/20 0839 01/25/20 1804 01/26/20 0901  WBC 76.4* 62.1* 51.3* PENDING  HGB 12.8 12.4 12.6 11.6*  HCT 38.3 39.1 38.0 34.0*  PLT 143* 134* 146* 179    COAGS: Recent Labs    01/23/20 2030  INR 1.3*  APTT 26    BMP: Recent Labs    01/24/20 0416 01/24/20 1819 01/25/20 0324 01/25/20 1804  NA 145 149* 147* 148*  K 4.3 3.0* 3.7 3.9  CL 111 117* 119* 117*  CO2 17* 19* 17* 19*  GLUCOSE 133* 115* 140* 156*  BUN 44* 36* 31* 26*  CALCIUM 8.4* 8.2* 8.2* 8.4*  CREATININE 2.17* 1.28* 0.93 0.73  GFRNONAA 23* 43* >60 >60  GFRAA 26* 49* >60 >60    LIVER FUNCTION TESTS: Recent Labs    01/24/20 0416 01/24/20 1819 01/25/20 0324 01/25/20 1804  BILITOT 1.2 1.1 1.1 1.3*  AST 120* 55* 39 22  ALT 198* 143* 113* 86*  ALKPHOS 164* 120 150* 123  PROT 6.1* 5.8* 5.5* 5.5*  ALBUMIN 3.2* 2.7* 2.5* 2.4*     Assessment and Plan:  Acute encephalopathy, unknown etiology, s/p attempted LP unsuccessful secondary to inability to hold still. Plan for image-guided LP with sedation today in IR. Patient is NPO. Afebrile.  Risks and benefits discussed with the patient  including, but not limited to bleeding, infection, damage to adjacent structures or low yield requiring additional tests. All of the patient's questions were answered, patient is agreeable to proceed. Consent obtained by patient's husband, Lacia Dobson, via telephone- signed and in IR control room.   Thank you for this interesting consult.  I greatly enjoyed meeting EIRENE LLERA and look forward to participating in their care.  A copy of this report was sent to the requesting provider on this date.  Electronically Signed: Earley Abide, PA-C 01/26/2020, 10:20 AM   I spent a total of 40 Minutes in face to face in clinical consultation, greater than 50% of which was counseling/coordinating care for acute encephalopathy/LP.

## 2020-01-26 NOTE — Progress Notes (Signed)
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  History is not able to be obtained from the patient given her current condition but is reported that she is not feeling well for last 24 hours and has been running a fever and has been vomiting..  In the ED she is found to be febrile and septic with a temperature of 101, tachycardic as well as an elevated white blood cell count of 78,000.  She is also found to have an acute kidney injury with a creatinine of 3.09.  She did have positive neck rigidity and a CT of the head and CT of the abdomen were done which were unremarkable.  Also noted that she had acute liver injury with elevated LFTs.  Chest x-ray did not show anything and COVID-19 testing was negative.  Urinalysis did show some hyaline casts and some WBCs 11-20 with trace leukocytes.  Lumbar puncture was attempted twice in the ED however was unsuccessful but nonetheless she is started on empiric antibiotics for possible meningitis and admitted for further work-up.  MRI was done and was technically limited but there was diffuse fluid hyperdensity throughout the cerebral sulcal and surrounding brainstem and cerebellum.  While this finding could be artificial nature related to overlying susceptibility artifact however proteinaceous material and changes related meningitis could not be excluded and an LP is to be done but because this is unsuccessful at the bedside will be done under fluoroscopy and I received approval from Dr. Posey Pronto the radiologist for this.  She will be going for her LP soon.  Neurology was consulted and  they have ordered a EEG as well and patient was started on meningitis treatment with vancomycin, meropenem as well as acyclovir.  Her WBC is improving as well as her renal function.  Liver enzymes are also trending down slightly and patient continues to have a metabolic acidosis.  Because of her history of promyelocytic leukemia oncology is consulted and they have no further recommendations at this time from a hematological standpoint.  01/25/2020 she is improving from a laboratory perspective and a little more awake today but was significantly agitated.  Blood cultures are growing Streptococcus pneumonia and will continue on IV meropenem and IV vancomycin.  01/26/2020: Much more awake and alert today and able to tell me that she is in the hospital but she thinks she is in [facility].  Able to tell me her full name and able to converse with me today.  Because she was calmer today we were able to get the IR lumbar puncture.  LTM finished today as well.  Labs are improving however she still hypernatremic but renal function is much improved.  Assessment & Plan:   Principal Problem:   Sepsis ([place]) Active Problems:   Essential hypertension   Acute encephalopathy   Meningitis  Sepsis with concern for meningitis/encephalitis from Streptococcus pneumonia with complications including acute encephalopathy as well as acute renal failure and acute liver injury -MRI as above.  -Dr. Hal Hope Discussed with neurology about the MRI does not show any acute stroke.  -Since ER physician attempted lumbar puncture and was not successful  will consult fluoroscopy forLumbar puncture which is still pending to be done as the patient was unable to lay still and will need to attempt under general anesthesia likely -Presently on empiric antibiotics for meningitis/encephalitis and have added Acyvlovir but this was stopped -Continue empiric antibiotics until LP with IV meropenem and IV vancomycin -Follow cultures blood  cultures 3 out of 4 to coccus pneumonia -Check EEG and neurology recommended long-term EEG -Neurology has been consulted. -Blood Cx showed 3/4 + Cx for Streptococcus Pneumoniae -WBC is now trending down now from 76.4 and now 33.6 -Neurology was consulted and recommended Keppra 750 twice daily -Continue seizure precautions next-when she is more stable will obtain a brain MRI with contrast -Underwent EEG monitoring and is not suggestive of nonconvulsive status epilepticus but does show cortical irritable ability bilaterally -Also went under LTM and this is to finish today -Underwent an LP today -Further care per neurology  Significant Leukocytosis, improving -With history of promyelocytic leukemia Dr. Burney Gauze oncology has been consulted.  -Oncologist at this time feels that patient's symptoms may be related to infection. -C/w Abx -trending down  History of hypertension  -We will keep patient on as needed IV labetalol for now.  History of GERD.  Elevated LFTs , acute liver injury in the setting of sepsis -In the setting of Sepsis, improving -AST is now 31 and ALT is now 70 and improved from admission -Check acute hepatitis panel and follow LFTs.   Acute encephalopathy improving -Could be from meningitis encephalitis or sepsis. See #1. -He is also hyponatremic and we are working on improving this with D5W +20 mEq of KCl at 75 mL's per hour but I stopped this today given her mild respiratory distress -patient sodium is now 148 and will continue to monitor carefully -continue with EEG as per neurology and will need a lumbar puncture and may need to attempt this under general anesthesia given that she is continuingly agitated now  Acute kidney injury, improved Metabolic acidosis, mild now  -Related to sepsis  -Continue hydration follow metabolic panel intake output. -Improving as patient's BUN/creatinine is now 23/0.75 -Given an Amp of Sodium Bicarbonate -Patient's CO2 is  now 19, chloride is 118, anion gap is 11 -Avoid nephrotoxic medications, contrast dyes, hypotension and renally adjust medications -repeat CMP in a.m.  Hyperbilirubinemia -Mild -Patient T Vergia Alcon is now 1.4 -continue monitor and trend -Repeat CMP in a.m.  History of leukemia. See #2.  Thrombocytopenia -In the setting of sepsis likely reactive next-patient's platelet count is now improving from 126,000 to 179,000 -Continue to Monitor for S/Sx of Bleeding -Repeat CBC in AM   Hypophosphatemia -Patient's phosphorus level was 1.6 -Replete with IV K-Phos -continue to monitor and replete as necessary  -Repeat Phos in a.m.  Obesity -Estimated body mass index is 32.12 kg/m as calculated from the following:   Height as of this encounter: 5' 2.64" (1.591 m).   Weight as of this encounter: 81.3 kg. -Will provide Weight Loss and Dietary Counseling when more awake  DVT prophylaxis: SCDs; Will add Enoxaparin 40 mg sq Code Status: FULL CODE   Family Communication: No family present at bedside Disposition Plan: Pending further clinical improvement and will need PT OT evaluations and lumbar puncture as well as clearance by neurology prior to discharge  Consultants:   Neurology  Oncology  Interventional radiology   Procedures:  EEG LTM   Antimicrobials:  Anti-infectives (From admission, onward)   Start     Dose/Rate Route Frequency Ordered Stop  01/25/20 1200  acyclovir (ZOVIRAX) 610 mg in dextrose 5 % 100 mL IVPB  Status:  Discontinued     610 mg 112.2 mL/hr over 60 Minutes Intravenous Every 8 hours 01/25/20 0924 01/25/20 0933   01/25/20 1000  meropenem (MERREM) 2 g in sodium chloride 0.9 % 100 mL IVPB     2 g 200 mL/hr over 30 Minutes Intravenous Every 8 hours 01/25/20 0919     01/25/20 1000  vancomycin (VANCOREADY) IVPB 750 mg/150 mL     750 mg 150 mL/hr over 60 Minutes Intravenous Every 12 hours 01/25/20 0919     01/24/20 0230  acyclovir (ZOVIRAX) 680 mg in dextrose 5 %  100 mL IVPB  Status:  Discontinued     680 mg 113.6 mL/hr over 60 Minutes Intravenous Every 24 hours 01/24/20 0137 01/25/20 0924   01/23/20 2200  meropenem (MERREM) 1 g in sodium chloride 0.9 % 100 mL IVPB  Status:  Discontinued     1 g 200 mL/hr over 30 Minutes Intravenous Every 8 hours 01/23/20 2042 01/23/20 2044   01/23/20 2200  meropenem (MERREM) 2 g in sodium chloride 0.9 % 100 mL IVPB  Status:  Discontinued     2 g 200 mL/hr over 30 Minutes Intravenous Every 8 hours 01/23/20 2044 01/23/20 2052   01/23/20 2100  meropenem (MERREM) 1 g in sodium chloride 0.9 % 100 mL IVPB  Status:  Discontinued     1 g 200 mL/hr over 30 Minutes Intravenous Every 12 hours 01/23/20 2052 01/25/20 0919   01/23/20 2055  vancomycin variable dose per unstable renal function (pharmacist dosing)  Status:  Discontinued      Does not apply See admin instructions 01/23/20 2056 01/25/20 0934   01/23/20 2045  vancomycin (VANCOREADY) IVPB 1750 mg/350 mL     1,750 mg 175 mL/hr over 120 Minutes Intravenous  Once 01/23/20 2042 01/24/20 0010     Subjective: Seen and examined at bedside and she is not as encephalopathic and she is more alert and she does respond to communicate.  Able to tell me her full name and where she is and her birthday.  She does admit to having some shortness of breath.  No lightheadedness or dizziness.  Wanting to rest.  No other concerns or complaints at this time.  Objective: Vitals:   01/26/20 1650 01/26/20 1655 01/26/20 1700 01/26/20 1710  BP: (!) 166/94 (!) 166/93 (!) 156/95 (!) 166/94  Pulse: (!) 109 (!) 109 (!) 110 99  Resp: 20 17 (!) 24 (!) 22  Temp:      TempSrc:      SpO2: 99% 100%  95%  Weight:      Height:        Intake/Output Summary (Last 24 hours) at 01/26/2020 1734 Last data filed at 01/26/2020 1500 Gross per 24 hour  Intake 350 ml  Output --  Net 350 ml   Filed Weights   01/25/20 0404 01/25/20 0900 01/26/20 0500  Weight: 79.8 kg 77.3 kg 81.3 kg    Examination: Physical Exam:  Constitutional: WN/WD obese female currently in no acute distress but appears uncomfortable.  She is not as agitated yesterday and she is more aler Eyes: Liids and conjunctivae normal, sclerae anicteric  ENMT: External Ears, Nose appear normal. Grossly normal hearing. Neck: Appears normal, supple, no cervical masses, normal ROM, no appreciable thyromegaly: No JVD Respiratory: Diminished to auscultation bilaterally coarse breath sounds, no wheezing, rales, rhonchi or crackles.  Patient is slightly tachypneic and she  is wearing 2 L of supplemental oxygen via nasal cannula  Cardiovascular: Slightly tachycardic rate but regular rhythm, no murmurs / rubs / gallops. S1 and S2 auscultated. No extremity edema.  Abdomen: Soft, non-tender, distended second body habitus. Bowel sounds positive.  GU: Deferred. Musculoskeletal: No clubbing / cyanosis of digits/nails. No joint deformity upper and lower extremities.  Skin: Has a right leg birthmark on her thigh and leg no induration; Warm and dry.  Neurologic: CN 2-12 grossly intact with no focal deficits and is more awake. Romberg sign and cerebellar reflexes not assessed.  Psychiatric: Impaired judgment and insight. Alert and oriented x 2.  Slightly somnolent mood and appropriate affect.   Data Reviewed: I have personally reviewed following labs and imaging studies  CBC: Recent Labs  Lab 01/24/20 0802 01/24/20 1819 01/25/20 0839 01/25/20 1804 01/26/20 0901  WBC 71.4* 76.4* 62.1* 51.3* 33.6*  NEUTROABS 57.7* 62.6* 51.5* 42.9* 22.8*  HGB 12.4 12.8 12.4 12.6 11.6*  HCT 37.8 38.3 39.1 38.0 34.0*  MCV 83.6 81.7 85.4 81.5 79.3*  PLT 126* 143* 134* 146* 0000000   Basic Metabolic Panel: Recent Labs  Lab 01/24/20 0416 01/24/20 1819 01/25/20 0324 01/25/20 0839 01/25/20 1804 01/26/20 0901 01/26/20 1223 01/26/20 1422  NA 145 149* 147*  --  148*  --  148*  --   K 4.3 3.0* 3.7  --  3.9  --  4.4  --   CL 111 117* 119*   --  117*  --  118*  --   CO2 17* 19* 17*  --  19*  --  19*  --   GLUCOSE 133* 115* 140*  --  156*  --  135*  --   BUN 44* 36* 31*  --  26*  --  23  --   CREATININE 2.17* 1.28* 0.93  --  0.73  --  0.75  --   CALCIUM 8.4* 8.2* 8.2*  --  8.4*  --  8.5*  --   MG  --  1.5*  --  2.4 2.2  --   --  2.1  PHOS  --  2.7  --  1.8* 2.1* 1.6*  --   --    GFR: Estimated Creatinine Clearance: 66.5 mL/min (by C-G formula based on SCr of 0.75 mg/dL). Liver Function Tests: Recent Labs  Lab 01/24/20 0416 01/24/20 0416 01/24/20 1819 01/25/20 0324 01/25/20 1804 01/26/20 1223 01/26/20 1422  AST 120*  --  55* 39 22 31  --   ALT 198*  --  143* 113* 86* 70*  --   ALKPHOS 164*  --  120 150* 123 117  --   BILITOT 1.2   < > 1.1 1.1 1.3* QUANTITY NOT SUFFICIENT, UNABLE TO PERFORM TEST 1.4*  PROT 6.1*  --  5.8* 5.5* 5.5* 5.5*  --   ALBUMIN 3.2*  --  2.7* 2.5* 2.4* 2.5*  --    < > = values in this interval not displayed.   Recent Labs  Lab 01/23/20 2030  LIPASE 13   No results for input(s): AMMONIA in the last 168 hours. Coagulation Profile: Recent Labs  Lab 01/23/20 2030  INR 1.3*   Cardiac Enzymes: No results for input(s): CKTOTAL, CKMB, CKMBINDEX, TROPONINI in the last 168 hours. BNP (last 3 results) No results for input(s): PROBNP in the last 8760 hours. HbA1C: No results for input(s): HGBA1C in the last 72 hours. CBG: Recent Labs  Lab 01/25/20 1257 01/25/20 1735 01/26/20 0016 01/26/20 0626 01/26/20 1224  GLUCAP 115* 141* 143* 146* 131*   Lipid Profile: No results for input(s): CHOL, HDL, LDLCALC, TRIG, CHOLHDL, LDLDIRECT in the last 72 hours. Thyroid Function Tests: No results for input(s): TSH, T4TOTAL, FREET4, T3FREE, THYROIDAB in the last 72 hours. Anemia Panel: No results for input(s): VITAMINB12, FOLATE, FERRITIN, TIBC, IRON, RETICCTPCT in the last 72 hours. Sepsis Labs: Recent Labs  Lab 01/23/20 2029  LATICACIDVEN 1.6    Recent Results (from the past 240 hour(s))   Blood Culture (routine x 2)     Status: Abnormal   Collection Time: 01/23/20  8:29 PM   Specimen: BLOOD LEFT HAND  Result Value Ref Range Status   Specimen Description BLOOD LEFT HAND  Final   Special Requests   Final    BOTTLES DRAWN AEROBIC AND ANAEROBIC Blood Culture adequate volume   Culture  Setup Time   Final    GRAM POSITIVE COCCI IN PAIRS IN BOTH AEROBIC AND ANAEROBIC BOTTLES CRITICAL RESULT CALLED TO, READ BACK BY AND VERIFIED WITH: T. BAUMEISTER, PHARMD AT 1120 ON 01/24/20 BY C. JESSUP, MT.    Culture (A)  Final    STREPTOCOCCUS PNEUMONIAE SUSCEPTIBILITIES PERFORMED ON PREVIOUS CULTURE WITHIN THE LAST 5 DAYS. Performed at [facility], [place] [address] [place], [place] [address]    Report Status 01/26/2020 FINAL  Final  Blood Culture (routine x 2)     Status: Abnormal   Collection Time: 01/23/20  8:55 PM   Specimen: BLOOD RIGHT HAND  Result Value Ref Range Status   Specimen Description BLOOD RIGHT HAND  Final   Special Requests   Final    BOTTLES DRAWN AEROBIC AND ANAEROBIC Blood Culture results may not be optimal due to an inadequate volume of blood received in culture bottles   Culture  Setup Time   Final    GRAM POSITIVE COCCI IN PAIRS IN BOTH AEROBIC AND ANAEROBIC BOTTLES CRITICAL RESULT CALLED TO, READ BACK BY AND VERIFIED WITH: T. BAUMEISTER, PHARMD AT 1120 ON 01/24/20 BY C. JESSUP, MT. Performed at [facility], [place] [address] [place], [place] [address]    Culture STREPTOCOCCUS PNEUMONIAE (A)  Final   Report Status 01/26/2020 FINAL  Final   Organism ID, Bacteria STREPTOCOCCUS PNEUMONIAE  Final      Susceptibility   Streptococcus pneumoniae - MIC*    ERYTHROMYCIN <=0.12 SENSITIVE Sensitive     LEVOFLOXACIN 0.5 SENSITIVE Sensitive     VANCOMYCIN 0.25 SENSITIVE Sensitive     PENICILLIN (meningitis) 0.12 RESISTANT Resistant     PENO - penicillin 0.12      PENICILLIN (non-meningitis) 0.12 SENSITIVE Sensitive     PENICILLIN (oral) 0.12 INTERMEDIATE  Intermediate     CEFTRIAXONE (non-meningitis) <=0.12 SENSITIVE Sensitive     CEFTRIAXONE (meningitis) <=0.12 SENSITIVE Sensitive     * STREPTOCOCCUS PNEUMONIAE  Blood Culture ID Panel (Reflexed)     Status: Abnormal   Collection Time: 01/23/20  8:55 PM  Result Value Ref Range Status   Enterococcus species NOT DETECTED NOT DETECTED Final   Listeria monocytogenes NOT DETECTED NOT DETECTED Final   Staphylococcus species NOT DETECTED NOT DETECTED Final   Staphylococcus aureus (BCID) NOT DETECTED NOT DETECTED Final   Streptococcus species DETECTED (A) NOT DETECTED Final    Comment: CRITICAL RESULT CALLED TO, READ BACK BY AND VERIFIED WITH: T. BAUMEISTER, PHARMD AT 1120 ON 01/24/20 BY C. JESSUP, MT.    Streptococcus agalactiae NOT DETECTED NOT DETECTED Final   Streptococcus pneumoniae DETECTED (A) NOT DETECTED Final  Comment: CRITICAL RESULT CALLED TO, READ BACK BY AND VERIFIED WITH: T. BAUMEISTER, PHARMD AT 1120 ON 01/24/20 BY C. JESSUP, MT.    Streptococcus pyogenes NOT DETECTED NOT DETECTED Final   Acinetobacter baumannii NOT DETECTED NOT DETECTED Final   Enterobacteriaceae species NOT DETECTED NOT DETECTED Final   Enterobacter cloacae complex NOT DETECTED NOT DETECTED Final   Escherichia coli NOT DETECTED NOT DETECTED Final   Klebsiella oxytoca NOT DETECTED NOT DETECTED Final   Klebsiella pneumoniae NOT DETECTED NOT DETECTED Final   Proteus species NOT DETECTED NOT DETECTED Final   Serratia marcescens NOT DETECTED NOT DETECTED Final   Haemophilus influenzae NOT DETECTED NOT DETECTED Final   Neisseria meningitidis NOT DETECTED NOT DETECTED Final   Pseudomonas aeruginosa NOT DETECTED NOT DETECTED Final   Candida albicans NOT DETECTED NOT DETECTED Final   Candida glabrata NOT DETECTED NOT DETECTED Final   Candida krusei NOT DETECTED NOT DETECTED Final   Candida parapsilosis NOT DETECTED NOT DETECTED Final   Candida tropicalis NOT DETECTED NOT DETECTED Final    Comment: Performed at  [facility], [place] [address] [place], [place] [address]  Urine culture     Status: None   Collection Time: 01/23/20  9:17 PM   Specimen: In/Out Cath Urine  Result Value Ref Range Status   Specimen Description IN/OUT CATH URINE  Final   Special Requests NONE  Final   Culture   Final    NO GROWTH Performed at [facility], [place] [address] [place], [place] [address]    Report Status 01/25/2020 FINAL  Final  SARS CORONAVIRUS 2 (TAT 6-24 HRS) Nasopharyngeal Nasopharyngeal Swab     Status: None   Collection Time: 01/23/20 11:06 PM   Specimen: Nasopharyngeal Swab  Result Value Ref Range Status   SARS Coronavirus 2 NEGATIVE NEGATIVE Final    Comment: (NOTE) SARS-CoV-2 target nucleic acids are NOT DETECTED. The SARS-CoV-2 RNA is generally detectable in upper and lower respiratory specimens during the acute phase of infection. Negative results do not preclude SARS-CoV-2 infection, do not rule out co-infections with other pathogens, and should not be used as the sole basis for treatment or other patient management decisions. Negative results must be combined with clinical observations, patient history, and epidemiological information. The expected result is Negative. Fact Sheet for Patients: SugarRoll.be Fact Sheet for Healthcare Providers: https://www.woods-mathews.com/ This test is not yet approved or cleared by the [place] FDA and  has been authorized for detection and/or diagnosis of SARS-CoV-2 by FDA under an Emergency Use Authorization (EUA). This EUA will remain  in effect (meaning this test can be used) for the duration of the COVID-19 declaration under Section 56 4(b)(1) of the Act, 21 U.S.C. section 360bbb-3(b)(1), unless the authorization is terminated or revoked sooner. Performed at [facility], [place] [address] [place], [place] [address]      RN Pressure Injury Documentation:     Estimated body mass index  is 32.12 kg/m as calculated from the following:   Height as of this encounter: 5' 2.64" (1.591 m).   Weight as of this encounter: 81.3 kg.  Malnutrition Type:      Malnutrition Characteristics:      Nutrition Interventions:     Radiology Studies: DG CHEST PORT 1 VIEW  Result Date: 01/26/2020 CLINICAL DATA:  Leukocytosis EXAM: PORTABLE CHEST 1 VIEW COMPARISON:  01/24/2020 FINDINGS: The cardiac silhouette, mediastinal and hilar contours are stable. Low lung volumes with vascular crowding and streaky atelectasis. Slight improved  lung aeration since the prior study with less vascular congestion and resolving edema. No pleural effusions. IMPRESSION: 1. Low lung volumes with vascular crowding and streaky atelectasis. 2. Improved lung aeration. Electronically Signed   By: Marijo Sanes M.D.   On: 01/26/2020 07:34   DG CHEST PORT 1 VIEW  Result Date: 01/24/2020 CLINICAL DATA:  Shortness of breath and hypoxia. EXAM: PORTABLE CHEST 1 VIEW COMPARISON:  Radiograph yesterday. FINDINGS: Lower lung volumes from prior exam. Prominent heart size likely accentuated by technique. Bronchovascular crowding versus vascular congestion. No significant pleural effusion. No pneumothorax. No confluent airspace disease. No acute osseous abnormalities are seen. IMPRESSION: Lower lung volumes from prior exam. Bronchovascular crowding versus vascular congestion. Prominent heart size likely accentuated by technique and low lung volumes. Electronically Signed   By: Keith Rake M.D.   On: 01/24/2020 19:19   Scheduled Meds: . chlorhexidine  15 mL Mouth Rinse BID  . enoxaparin (LOVENOX) injection  40 mg Subcutaneous Q24H  . fentaNYL      . mouth rinse  15 mL Mouth Rinse q12n4p  . midazolam       Continuous Infusions: . levETIRAcetam 750 mg (01/26/20 1006)  . meropenem (MERREM) IV 2 g (01/26/20 1010)  . vancomycin 750 mg (01/26/20 1213)    LOS: 3 days   Kerney Elbe, DO [facility] PAGER is  on [place]  If 7PM-7AM, please contact night-coverage [email or web address]

## 2020-01-26 NOTE — Progress Notes (Signed)
LTM EEG discontinued - no skin breakdown at unhook.   

## 2020-01-26 NOTE — Progress Notes (Addendum)
NEUROLOGY PROGRESS NOTE   Subjective: Patient is alert, believes that she is at [facility], able to follow instructions, still very lethargic.  Husband at bedside states that she has improved significantly over the last couple of days.  Exam: Vitals:   01/26/20 0500 01/26/20 0800  BP: (!) 172/80 (!) 168/106  Pulse: 95 99  Resp: (!) 36 (!) 26  Temp: 99.9 F (37.7 C) 98.4 F (36.9 C)  SpO2: 98% 100%   Physical Exam  Constitutional: Appears well-developed and well-nourished.  Eyes: No scleral injection HENT: No OP obstrucion Head: Normocephalic.  Cardiovascular: Normal rate and regular rhythm.  Respiratory: Effort normal, non-labored breathing GI: Soft.  No distension. There is no tenderness.  Skin: WDI   Neuro:  Mental Status: Alert, oriented to hospital but believes that she is at [facility].  Speech hypophonic but fluent without evidence of aphasia.  Able to follow simple commands without difficulty. Cranial Nerves: II:  Visual fields grossly normal, PERRL III,IV, VI: ptosis of right eye. EOMI  V,VII: smile symmetric, facial light touch sensation normal bilaterally VIII: hearing normal bilaterally Motor: Moving all extremities antigravity Sensory: Pinprick and light touch intact throughout, bilaterally Deep Tendon Reflexes: 2+ and symmetric throughout upper extremities and knee jerk Plantars: Right: downgoing   Left: downgoing    Medications:  Scheduled: . chlorhexidine  15 mL Mouth Rinse BID  . enoxaparin (LOVENOX) injection  40 mg Subcutaneous Q24H  . mouth rinse  15 mL Mouth Rinse q12n4p   Continuous: . dextrose 5 % with KCl 20 mEq / L 20 mEq (01/26/20 0918)  . levETIRAcetam 750 mg (01/25/20 2018)  . meropenem (MERREM) IV 2 g (01/26/20 0406)  . vancomycin 750 mg (01/25/20 2255)    Pertinent Labs/Diagnostics: BUN 26 Creatinine 0.73 WBC 51.3  EEG adult Result Date: 01/24/2020 Lora Havens, MD     01/24/2020 10:59 AMIMPRESSION: This study is  suggestive of cortical dysfunction in right hemisphere likely secondary to post-ictal state, underlying structural abnormality.  No seizures or definite epileptiform discharges were seen throughout the recording. Dr Rory Percy was notified. Priyanka Barbra Sarks   Overnight EEG with video Result Date: 01/24/2020 Lora Havens, MD     01/25/2020 10:07 AM Patient  IMPRESSION: This study is suggestive of epileptogenicity and cortical dysfunction in left frontotemporal region. Quasi rhythmic delta activity with sharp waves in left frontotemporal region can be on the ictal-interictal continuum. Additionally, there is evidence of cortical dysfunction in right hemisphere likely secondary to post-ictal state, underlying structural abnormality.  No definite seizures were seen throughout the recording. Priyanka Cipriano Mile PA-C [facility] [phone]  Assessment:  69 year old female with a past medical history of leukemia, non-Hodgkin's lymphoma and hiatal hernia who presented to the ED with altered mental status, fever significantly elevated leukocytosis and rightward gaze preference on admission.  She is noted to have neck stiffness and imaging suggestive of meningitis.   1. LP was attempted in the ED but failed. 2. LTM EEG is suggestive of epileptogenicity and cortical dysfunction in the left frontotemporal region. Quasi rhythmic delta activity with sharp waves in left frontotemporal region can be on the ictal-interictal continuum. Additionally, there is evidence of cortical dysfunction in right hemisphere likely secondary to post-ictal state, underlying structural abnormality.  No definite seizures were seen throughout the recording. Overall, EEG is not suggestive of nonconvulsive status epilepticus but shows cortical irritability bilaterally 3. Meningoencephalitis remains at the top of the differential diagnosis. Given that she  is growing strep pneumonia in her blood, this is also likely  in her CSF. Significantly less likely to be viral  4. Other possible contributing factors include toxic/metabolic encephalopathy in the setting of sepsis   Recommendations: -Continue Keppra 750 mg BID -Re-attempt lumbar puncture under fluoroscopy -Continue meropenem and vancomycin for empiric meningitis coverage.   Electronically signed: Dr. Kerney Elbe 01/26/2020, 9:55 AM

## 2020-01-26 NOTE — Plan of Care (Signed)
  Problem: Safety: Goal: Ability to remain free from injury will improve Outcome: Progressing  Patient fall free this shift, bed alarm on, frequent rounding provided.  Problem: Activity: Goal: Risk for activity intolerance will decrease Outcome: Progressing Staff able to turn patient in bed, patient without desaturation.  Problem: Clinical Measurements: Goal: Respiratory complications will improve Outcome: Progressing    Patient respiratory rate decreased after O2 applied for comfort.  Problem: Clinical Measurements: Goal: Will remain free from infection Outcome: Progressing  Patient afebrile this shift.

## 2020-01-26 NOTE — Progress Notes (Signed)
Overall, the mental status is about the same.  There is still some confusion.  She is growing strep pneumonia and her blood.  He does have to wonder if this is also in her CSF.  A lumbar puncture was attempted and was not successful.  Her white cell count is coming down.  Yesterday, her white cell count was 51.3.  Platelet count 146,000.  Hemoglobin 12.6.  With treatment, the white cell count is coming down nicely.  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  She is at high risk for infection by encapsulated bacteria and this truly has been the case with this strep pneumonia.  For right now, we will sign off the case.  There is no evidence of any leukemia.  She had no evidence of leukemia for about 20 years.  Hopefully, she will come around and get back to her normal state of mind and quality of life.  Lattie Haw, MD  Lurena Joiner [phone]

## 2020-01-27 ENCOUNTER — Inpatient Hospital Stay (HOSPITAL_COMMUNITY): Payer: Medicare HMO

## 2020-01-27 DIAGNOSIS — E878 Other disorders of electrolyte and fluid balance, not elsewhere classified: Secondary | ICD-10-CM

## 2020-01-27 LAB — GLUCOSE, CAPILLARY
Glucose-Capillary: 125 mg/dL — ABNORMAL HIGH (ref 70–99)
Glucose-Capillary: 132 mg/dL — ABNORMAL HIGH (ref 70–99)
Glucose-Capillary: 133 mg/dL — ABNORMAL HIGH (ref 70–99)
Glucose-Capillary: 153 mg/dL — ABNORMAL HIGH (ref 70–99)

## 2020-01-27 LAB — CBC WITH DIFFERENTIAL/PLATELET
Abs Immature Granulocytes: 0 10*3/uL (ref 0.00–0.07)
Basophils Absolute: 0 10*3/uL (ref 0.0–0.1)
Basophils Relative: 0 %
Eosinophils Absolute: 0 10*3/uL (ref 0.0–0.5)
Eosinophils Relative: 0 %
HCT: 39.9 % (ref 36.0–46.0)
Hemoglobin: 13.3 g/dL (ref 12.0–15.0)
Lymphocytes Relative: 13 %
Lymphs Abs: 3.3 10*3/uL (ref 0.7–4.0)
MCH: 26.7 pg (ref 26.0–34.0)
MCHC: 33.3 g/dL (ref 30.0–36.0)
MCV: 80 fL (ref 80.0–100.0)
Monocytes Absolute: 6.6 10*3/uL — ABNORMAL HIGH (ref 0.1–1.0)
Monocytes Relative: 26 %
Neutro Abs: 15.4 10*3/uL — ABNORMAL HIGH (ref 1.7–7.7)
Neutrophils Relative %: 61 %
Platelets: 111 10*3/uL — ABNORMAL LOW (ref 150–400)
RBC: 4.99 MIL/uL (ref 3.87–5.11)
RDW: 17.7 % — ABNORMAL HIGH (ref 11.5–15.5)
WBC: 25.3 10*3/uL — ABNORMAL HIGH (ref 4.0–10.5)
nRBC: 0 % (ref 0.0–0.2)

## 2020-01-27 LAB — PHOSPHORUS: Phosphorus: 3.1 mg/dL (ref 2.5–4.6)

## 2020-01-27 LAB — COMPREHENSIVE METABOLIC PANEL
ALT: 50 U/L — ABNORMAL HIGH (ref 0–44)
AST: 32 U/L (ref 15–41)
Albumin: 2.1 g/dL — ABNORMAL LOW (ref 3.5–5.0)
Alkaline Phosphatase: 102 U/L (ref 38–126)
Anion gap: 12 (ref 5–15)
BUN: 38 mg/dL — ABNORMAL HIGH (ref 8–23)
CO2: 19 mmol/L — ABNORMAL LOW (ref 22–32)
Calcium: 8.1 mg/dL — ABNORMAL LOW (ref 8.9–10.3)
Chloride: 119 mmol/L — ABNORMAL HIGH (ref 98–111)
Creatinine, Ser: 1.23 mg/dL — ABNORMAL HIGH (ref 0.44–1.00)
GFR calc Af Amer: 52 mL/min — ABNORMAL LOW (ref >60)
GFR calc non Af Amer: 45 mL/min — ABNORMAL LOW (ref >60)
Glucose, Bld: 140 mg/dL — ABNORMAL HIGH (ref 70–99)
Potassium: 4.5 mmol/L (ref 3.5–5.1)
Sodium: 150 mmol/L — ABNORMAL HIGH (ref 135–145)
Total Bilirubin: 2 mg/dL — ABNORMAL HIGH (ref 0.3–1.2)
Total Protein: 5.5 g/dL — ABNORMAL LOW (ref 6.5–8.1)

## 2020-01-27 LAB — HSV DNA BY PCR (REFERENCE LAB)
HSV 1 DNA: NEGATIVE
HSV 2 DNA: NEGATIVE

## 2020-01-27 LAB — CYTOLOGY - NON PAP

## 2020-01-27 LAB — MAGNESIUM: Magnesium: 2.3 mg/dL (ref 1.7–2.4)

## 2020-01-27 MED ORDER — POTASSIUM CL IN DEXTROSE 5% 20 MEQ/L IV SOLN
20.0000 meq | INTRAVENOUS | Status: DC
  Filled 2020-01-27: qty 1000

## 2020-01-27 MED ORDER — DEXTROSE 5 % IV SOLN: INTRAVENOUS | Status: DC

## 2020-01-27 MED ORDER — SODIUM CHLORIDE 0.9% FLUSH
10.0000 mL | Freq: Two times a day (BID) | INTRAVENOUS | Status: DC
  Administered 2020-01-27 – 2020-01-31 (×6): 10 mL
  Administered 2020-01-31 – 2020-02-01 (×2): 20 mL
  Administered 2020-02-01 – 2020-02-05 (×8): 10 mL

## 2020-01-27 MED ORDER — SODIUM CHLORIDE 0.9% FLUSH: 10.0000 mL | INTRAVENOUS | Status: DC | PRN

## 2020-01-27 NOTE — Progress Notes (Addendum)
Subjective: Patient remains confused today.  She believes she is at [facility].  She is able to follow verbal commands.  Objective: Current vital signs: BP (!) 159/80   Pulse 93   Temp 98 F (36.7 C) (Oral)   Resp (!) 23   Ht 5' 2.64" (1.591 m)   Wt 82.3 kg   SpO2 93%   BMI 32.51 kg/m  Vital signs in last 24 hours: Temp:  [98 F (36.7 C)-98.6 F (37 C)] 98 F (36.7 C) (03/30 0318) Pulse Rate:  [86-110] 93 (03/30 0815) Resp:  [17-42] 23 (03/30 0815) BP: (135-173)/(65-110) 159/80 (03/30 0815) SpO2:  [87 %-100 %] 93 % (03/30 0815) Weight:  [82.3 kg] 82.3 kg (03/30 0318)  Intake/Output from previous day: 03/29 0701 - 03/30 0700 In: 350 [IV Piggyback:350] Out: 1050 [Urine:1050] Intake/Output this shift: No intake/output data recorded. Nutritional status:  Diet Order            Diet NPO time specified  Diet effective now             Neuro:  Mental Status: Patient is alert.  She is not oriented as she believes she is at [facility].  When asked what month it is she continues to name October, November, December, January..  Does not know what year it is.  Is able to name my thumb and my small finger.  She is able to name the color of my glove. Cranial Nerves: II:  Visual fields grossly normal,  III,IV, VI: ptosis not present, extra-ocular motions intact bilaterally pupils equal, round, reactive to light and accommodation V,VII: smile symmetric, facial light touch sensation normal bilaterally VIII: hearing normal bilaterally Motor: Patient moving right arm and bilateral legs antigravity.  She states that her left arm hurts however has full strength in her left arm when attempting to lift it Sensory: Pinprick and light touch intact throughout, bilaterally  Lab Results: Results for orders placed or performed during the hospital encounter of 01/23/20 (from the past 48 hour(s))  CBC with Differential/Platelet     Status: Abnormal   Collection Time: 01/25/20  8:39 AM   Result Value Ref Range   WBC 62.1 (HH) 4.0 - 10.5 K/uL    Comment: REPEATED TO VERIFY CRITICAL VALUE NOTED.  VALUE IS CONSISTENT WITH PREVIOUSLY REPORTED AND CALLED VALUE. THIS CRITICAL RESULT HAS VERIFIED AND BEEN CALLED TO S.MCCLEN,RN BY PAMELA HENDERSON ON 03 28 2021 AT 0910, AND HAS BEEN READ BACK.     RBC 4.58 3.87 - 5.11 MIL/uL   Hemoglobin 12.4 12.0 - 15.0 g/dL   HCT 39.1 36.0 - 46.0 %   MCV 85.4 80.0 - 100.0 fL   MCH 27.1 26.0 - 34.0 pg   MCHC 31.7 30.0 - 36.0 g/dL   RDW 17.8 (H) 11.5 - 15.5 %   Platelets 134 (L) 150 - 400 K/uL    Comment: REPEATED TO VERIFY SPECIMEN CHECKED FOR CLOTS CONSISTENT WITH PREVIOUS RESULT    nRBC 0.0 0.0 - 0.2 %   Neutrophils Relative % 82 %   Neutro Abs 51.5 (H) 1.7 - 7.7 K/uL   Lymphocytes Relative 2 %   Lymphs Abs 1.2 0.7 - 4.0 K/uL   Monocytes Relative 13 %   Monocytes Absolute 7.8 (H) 0.1 - 1.0 K/uL   Eosinophils Relative 0 %   Eosinophils Absolute 0.0 0.0 - 0.5 K/uL   Basophils Relative 0 %   Basophils Absolute 0.0 0.0 - 0.1 K/uL   Immature Granulocytes 3 %  Abs Immature Granulocytes 1.55 (H) 0.00 - 0.07 K/uL    Comment: Performed at [facility], [place] [address], [place], [place] [address]  Magnesium     Status: None   Collection Time: 01/25/20  8:39 AM  Result Value Ref Range   Magnesium 2.4 1.7 - 2.4 mg/dL    Comment: Performed at [facility] [address] [place], [place] [address]  Phosphorus     Status: Abnormal   Collection Time: 01/25/20  8:39 AM  Result Value Ref Range   Phosphorus 1.8 (L) 2.5 - 4.6 mg/dL    Comment: Performed at [facility] [address], [place], [place] [address]  Glucose, capillary     Status: Abnormal   Collection Time: 01/25/20 12:57 PM  Result Value Ref Range   Glucose-Capillary 115 (H) 70 - 99 mg/dL    Comment: Glucose reference range applies only to samples taken after fasting for at least 8 hours.  Glucose, capillary     Status: Abnormal   Collection Time: 01/25/20   5:35 PM  Result Value Ref Range   Glucose-Capillary 141 (H) 70 - 99 mg/dL    Comment: Glucose reference range applies only to samples taken after fasting for at least 8 hours.  CBC with Differential/Platelet     Status: Abnormal   Collection Time: 01/25/20  6:04 PM  Result Value Ref Range   WBC 51.3 (HH) 4.0 - 10.5 K/uL    Comment: REPEATED TO VERIFY THIS CRITICAL RESULT HAS VERIFIED AND BEEN CALLED TO S.MCCLAIN,RN BY PAMELA HENDERSON ON 03 28 2021 AT 1859, AND HAS BEEN READ BACK.     RBC 4.66 3.87 - 5.11 MIL/uL   Hemoglobin 12.6 12.0 - 15.0 g/dL   HCT 38.0 36.0 - 46.0 %   MCV 81.5 80.0 - 100.0 fL   MCH 27.0 26.0 - 34.0 pg   MCHC 33.2 30.0 - 36.0 g/dL   RDW 17.3 (H) 11.5 - 15.5 %   Platelets 146 (L) 150 - 400 K/uL    Comment: REPEATED TO VERIFY SPECIMEN CHECKED FOR CLOTS    nRBC 0.0 0.0 - 0.2 %   Neutrophils Relative % 84 %   Neutro Abs 42.9 (H) 1.7 - 7.7 K/uL   Lymphocytes Relative 2 %   Lymphs Abs 1.2 0.7 - 4.0 K/uL   Monocytes Relative 12 %   Monocytes Absolute 6.0 (H) 0.1 - 1.0 K/uL   Eosinophils Relative 0 %   Eosinophils Absolute 0.0 0.0 - 0.5 K/uL   Basophils Relative 0 %   Basophils Absolute 0.2 (H) 0.0 - 0.1 K/uL   Immature Granulocytes 2 %   Abs Immature Granulocytes 1.02 (H) 0.00 - 0.07 K/uL    Comment: Performed at [facility], [address] [place], [place] [address]  Comprehensive metabolic panel     Status: Abnormal   Collection Time: 01/25/20  6:04 PM  Result Value Ref Range   Sodium 148 (H) 135 - 145 mmol/L   Potassium 3.9 3.5 - 5.1 mmol/L   Chloride 117 (H) 98 - 111 mmol/L   CO2 19 (L) 22 - 32 mmol/L   Glucose, Bld 156 (H) 70 - 99 mg/dL    Comment: Glucose reference range applies only to samples taken after fasting for at least 8 hours.   BUN 26 (H) 8 - 23 mg/dL   Creatinine, Ser 0.73 0.44 - 1.00 mg/dL   Calcium 8.4 (L) 8.9 - 10.3 mg/dL   Total  Protein 5.5 (L) 6.5 - 8.1 g/dL   Albumin 2.4 (L) 3.5 - 5.0 g/dL   AST 22 15 - 41 U/L   ALT 86  (H) 0 - 44 U/L   Alkaline Phosphatase 123 38 - 126 U/L   Total Bilirubin 1.3 (H) 0.3 - 1.2 mg/dL   GFR calc non Af Amer >60 >60 mL/min   GFR calc Af Amer >60 >60 mL/min   Anion gap 12 5 - 15    Comment: Performed at [facility] [address] [place], [place] [address]  Magnesium     Status: None   Collection Time: 01/25/20  6:04 PM  Result Value Ref Range   Magnesium 2.2 1.7 - 2.4 mg/dL    Comment: Performed at [facility], [place] [address], [place], [place] [address]  Phosphorus     Status: Abnormal   Collection Time: 01/25/20  6:04 PM  Result Value Ref Range   Phosphorus 2.1 (L) 2.5 - 4.6 mg/dL    Comment: Performed at [facility] [address] [place], [place] [address]  Glucose, capillary     Status: Abnormal   Collection Time: 01/26/20 12:16 AM  Result Value Ref Range   Glucose-Capillary 143 (H) 70 - 99 mg/dL    Comment: Glucose reference range applies only to samples taken after fasting for at least 8 hours.  Glucose, capillary     Status: Abnormal   Collection Time: 01/26/20  6:26 AM  Result Value Ref Range   Glucose-Capillary 146 (H) 70 - 99 mg/dL    Comment: Glucose reference range applies only to samples taken after fasting for at least 8 hours.  CBC with Differential/Platelet     Status: Abnormal   Collection Time: 01/26/20  9:01 AM  Result Value Ref Range   WBC 33.6 (H) 4.0 - 10.5 K/uL   RBC 4.29 3.87 - 5.11 MIL/uL   Hemoglobin 11.6 (L) 12.0 - 15.0 g/dL   HCT 34.0 (L) 36.0 - 46.0 %   MCV 79.3 (L) 80.0 - 100.0 fL   MCH 27.0 26.0 - 34.0 pg   MCHC 34.1 30.0 - 36.0 g/dL   RDW 16.7 (H) 11.5 - 15.5 %   Platelets 179 150 - 400 K/uL    Comment: REPEATED TO VERIFY SPECIMEN CHECKED FOR CLOTS    nRBC 0.0 0.0 - 0.2 %   Neutrophils Relative % 68 %   Neutro Abs 22.8 (H) 1.7 - 7.7 K/uL   Lymphocytes Relative 19 %   Lymphs Abs 6.4 (H) 0.7 - 4.0 K/uL   Monocytes Relative 7 %   Monocytes Absolute 2.4 (H) 0.1 - 1.0 K/uL   Eosinophils Relative 5 %    Eosinophils Absolute 1.7 (H) 0.0 - 0.5 K/uL   Basophils Relative 1 %   Basophils Absolute 0.3 (H) 0.0 - 0.1 K/uL   Abs Immature Granulocytes 0.00 0.00 - 0.07 K/uL   Reactive, Benign Lymphocytes PRESENT    Polychromasia PRESENT     Comment: Performed at [facility], [address], [place], [place] [address]  Phosphorus     Status: Abnormal   Collection Time: 01/26/20  9:01 AM  Result Value Ref Range   Phosphorus 1.6 (L) 2.5 - 4.6 mg/dL    Comment: Performed at [facility] [address], [place], [place] [address]  Comprehensive metabolic panel     Status: Abnormal   Collection Time: 01/26/20 12:23 PM  Result Value Ref Range  Sodium 148 (H) 135 - 145 mmol/L   Potassium 4.4 3.5 - 5.1 mmol/L   Chloride 118 (H) 98 - 111 mmol/L   CO2 19 (L) 22 - 32 mmol/L   Glucose, Bld 135 (H) 70 - 99 mg/dL    Comment: Glucose reference range applies only to samples taken after fasting for at least 8 hours.   BUN 23 8 - 23 mg/dL   Creatinine, Ser 0.75 0.44 - 1.00 mg/dL   Calcium 8.5 (L) 8.9 - 10.3 mg/dL   Total Protein 5.5 (L) 6.5 - 8.1 g/dL   Albumin 2.5 (L) 3.5 - 5.0 g/dL   AST 31 15 - 41 U/L   ALT 70 (H) 0 - 44 U/L   Alkaline Phosphatase 117 38 - 126 U/L   Total Bilirubin QUANTITY NOT SUFFICIENT, UNABLE TO PERFORM TEST 0.3 - 1.2 mg/dL   GFR calc non Af Amer >60 >60 mL/min   GFR calc Af Amer >60 >60 mL/min   Anion gap 11 5 - 15    Comment: Performed at [facility], [place] [address] [place], [place] [address]  Glucose, capillary     Status: Abnormal   Collection Time: 01/26/20 12:24 PM  Result Value Ref Range   Glucose-Capillary 131 (H) 70 - 99 mg/dL    Comment: Glucose reference range applies only to samples taken after fasting for at least 8 hours.  Magnesium     Status: None   Collection Time: 01/26/20  2:22 PM  Result Value Ref Range   Magnesium 2.1 1.7 - 2.4 mg/dL    Comment: Performed at [facility], [place] [address] [place], [place] [address]  Bilirubin,  total     Status: Abnormal   Collection Time: 01/26/20  2:22 PM  Result Value Ref Range   Total Bilirubin 1.4 (H) 0.3 - 1.2 mg/dL    Comment: Performed at [facility] [address] [place], [place] [address]  Glucose, CSF     Status: Abnormal   Collection Time: 01/26/20  5:37 PM  Result Value Ref Range   Glucose, CSF <20 (LL) 40 - 70 mg/dL    Comment: CRITICAL RESULT CALLED TO, READ BACK BY AND VERIFIED WITHOletha Cruel, RN [phone] 01/26/2020 WBOND Performed at [facility], [place] [address] [place], [place] [address]   Protein, CSF     Status: Abnormal   Collection Time: 01/26/20  5:37 PM  Result Value Ref Range   Total  Protein, CSF 292 (H) 15 - 45 mg/dL    Comment: RESULTS CONFIRMED BY MANUAL DILUTION Performed at [facility] [address], [place],  [address]   CSF cell count with differential     Status: Abnormal   Collection Time: 01/26/20  5:37 PM  Result Value Ref Range   Tube # 1    Color, CSF YELLOW (A) COLORLESS   Appearance, CSF HAZY (A) CLEAR   Supernatant XANTHOCHROMIC    RBC Count, CSF 400 (H) 0 /cu mm   WBC, CSF 30,750 (HH) 0 - 5 /cu mm    Comment: CRITICAL RESULT CALLED TO, READ BACK BY AND VERIFIED WITH: V.HARRELO RN 1917 01/26/20 MCCORMICK K    Segmented Neutrophils-CSF 93 (H) 0 - 6 %   Lymphs, CSF 2 (L) 40 - 80 %   Monocyte-Macrophage-Spinal Fluid 5 (L) 15 - 45 %   Eosinophils, CSF 0 0 - 1 %    Comment: Performed at [facility], [address], [place], [place] [address]  CSF culture     Status: None (Preliminary result)   Collection Time: 01/26/20  5:37 PM   Specimen: PATH Cytology CSF; Cerebrospinal Fluid  Result Value Ref Range   Specimen Description CSF    Special Requests NONE    Gram Stain      WBC PRESENT, PREDOMINANTLY PMN NO ORGANISMS SEEN CYTOSPIN SMEAR Performed at [facility] [address], [place], [place] [address]    Culture PENDING    Report Status PENDING   Glucose, capillary     Status:  Abnormal   Collection Time: 01/26/20  5:41 PM  Result Value Ref Range   Glucose-Capillary 130 (H) 70 - 99 mg/dL    Comment: Glucose reference range applies only to samples taken after fasting for at least 8 hours.  Glucose, capillary     Status: Abnormal   Collection Time: 01/26/20 11:11 PM  Result Value Ref Range   Glucose-Capillary 125 (H) 70 - 99 mg/dL    Comment: Glucose reference range applies only to samples taken after fasting for at least 8 hours.   Comment 1 Notify RN    Comment 2 Document in Chart   Glucose, capillary     Status: Abnormal   Collection Time: 01/27/20  6:11 AM  Result Value Ref Range   Glucose-Capillary 153 (H) 70 - 99 mg/dL    Comment: Glucose reference range applies only to samples taken after fasting for at least 8 hours.   Comment 1 Notify RN    Comment 2 Document in Chart     Recent Results (from the past 240 hour(s))  Blood Culture (routine x 2)     Status: Abnormal   Collection Time: 01/23/20  8:29 PM   Specimen: BLOOD LEFT HAND  Result Value Ref Range Status   Specimen Description BLOOD LEFT HAND  Final   Special Requests   Final    BOTTLES DRAWN AEROBIC AND ANAEROBIC Blood Culture adequate volume   Culture  Setup Time   Final    GRAM POSITIVE COCCI IN PAIRS IN BOTH AEROBIC AND ANAEROBIC BOTTLES CRITICAL RESULT CALLED TO, READ BACK BY AND VERIFIED WITH: T. BAUMEISTER, PHARMD AT 1120 ON 01/24/20 BY C. JESSUP, MT.    Culture (A)  Final    STREPTOCOCCUS PNEUMONIAE SUSCEPTIBILITIES PERFORMED ON PREVIOUS CULTURE WITHIN THE LAST 5 DAYS. Performed at [facility], [place] [address], [place], [place] [address]    Report Status 01/26/2020 FINAL  Final  Blood Culture (routine x 2)     Status: Abnormal   Collection Time: 01/23/20  8:55 PM   Specimen: BLOOD RIGHT HAND  Result Value Ref Range Status   Specimen Description BLOOD RIGHT HAND  Final   Special Requests   Final    BOTTLES DRAWN AEROBIC AND ANAEROBIC Blood Culture results may not be  optimal due to an inadequate volume of blood received in culture bottles   Culture  Setup Time   Final    GRAM POSITIVE COCCI IN PAIRS IN BOTH AEROBIC AND ANAEROBIC BOTTLES CRITICAL RESULT CALLED TO, READ BACK BY AND VERIFIED WITH: T. BAUMEISTER, PHARMD AT 1120 ON 01/24/20 BY C. JESSUP, MT. Performed at [facility], [place] [address] [place], [place] [address]    Culture STREPTOCOCCUS PNEUMONIAE (A)  Final   Report Status 01/26/2020 FINAL  Final   Organism ID, Bacteria STREPTOCOCCUS PNEUMONIAE  Final      Susceptibility   Streptococcus  pneumoniae - MIC*    ERYTHROMYCIN <=0.12 SENSITIVE Sensitive     LEVOFLOXACIN 0.5 SENSITIVE Sensitive     VANCOMYCIN 0.25 SENSITIVE Sensitive     PENICILLIN (meningitis) 0.12 RESISTANT Resistant     PENO - penicillin 0.12      PENICILLIN (non-meningitis) 0.12 SENSITIVE Sensitive     PENICILLIN (oral) 0.12 INTERMEDIATE Intermediate     CEFTRIAXONE (non-meningitis) <=0.12 SENSITIVE Sensitive     CEFTRIAXONE (meningitis) <=0.12 SENSITIVE Sensitive     * STREPTOCOCCUS PNEUMONIAE  Blood Culture ID Panel (Reflexed)     Status: Abnormal   Collection Time: 01/23/20  8:55 PM  Result Value Ref Range Status   Enterococcus species NOT DETECTED NOT DETECTED Final   Listeria monocytogenes NOT DETECTED NOT DETECTED Final   Staphylococcus species NOT DETECTED NOT DETECTED Final   Staphylococcus aureus (BCID) NOT DETECTED NOT DETECTED Final   Streptococcus species DETECTED (A) NOT DETECTED Final    Comment: CRITICAL RESULT CALLED TO, READ BACK BY AND VERIFIED WITH: T. BAUMEISTER, PHARMD AT 1120 ON 01/24/20 BY C. JESSUP, MT.    Streptococcus agalactiae NOT DETECTED NOT DETECTED Final   Streptococcus pneumoniae DETECTED (A) NOT DETECTED Final    Comment: CRITICAL RESULT CALLED TO, READ BACK BY AND VERIFIED WITH: T. BAUMEISTER, PHARMD AT 1120 ON 01/24/20 BY C. JESSUP, MT.    Streptococcus pyogenes NOT DETECTED NOT DETECTED Final   Acinetobacter baumannii NOT  DETECTED NOT DETECTED Final   Enterobacteriaceae species NOT DETECTED NOT DETECTED Final   Enterobacter cloacae complex NOT DETECTED NOT DETECTED Final   Escherichia coli NOT DETECTED NOT DETECTED Final   Klebsiella oxytoca NOT DETECTED NOT DETECTED Final   Klebsiella pneumoniae NOT DETECTED NOT DETECTED Final   Proteus species NOT DETECTED NOT DETECTED Final   Serratia marcescens NOT DETECTED NOT DETECTED Final   Haemophilus influenzae NOT DETECTED NOT DETECTED Final   Neisseria meningitidis NOT DETECTED NOT DETECTED Final   Pseudomonas aeruginosa NOT DETECTED NOT DETECTED Final   Candida albicans NOT DETECTED NOT DETECTED Final   Candida glabrata NOT DETECTED NOT DETECTED Final   Candida krusei NOT DETECTED NOT DETECTED Final   Candida parapsilosis NOT DETECTED NOT DETECTED Final   Candida tropicalis NOT DETECTED NOT DETECTED Final    Comment: Performed at [facility], [address], [place], [place] [address]  Urine culture     Status: None   Collection Time: 01/23/20  9:17 PM   Specimen: In/Out Cath Urine  Result Value Ref Range Status   Specimen Description IN/OUT CATH URINE  Final   Special Requests NONE  Final   Culture   Final    NO GROWTH Performed at [facility], [place] [address], [place], [place] [address]    Report Status 01/25/2020 FINAL  Final  SARS CORONAVIRUS 2 (TAT 6-24 HRS) Nasopharyngeal Nasopharyngeal Swab     Status: None   Collection Time: 01/23/20 11:06 PM   Specimen: Nasopharyngeal Swab  Result Value Ref Range Status   SARS Coronavirus 2 NEGATIVE NEGATIVE Final    Comment: (NOTE) SARS-CoV-2 target nucleic acids are NOT DETECTED. The SARS-CoV-2 RNA is generally detectable in upper and lower respiratory specimens during the acute phase of infection. Negative results do not preclude SARS-CoV-2 infection, do not rule out co-infections with other pathogens, and should not be used as the sole basis for treatment or other patient management  decisions. Negative results must be combined with clinical observations, patient history, and epidemiological information. The expected result is  Negative. Fact Sheet for Patients: SugarRoll.be Fact Sheet for Healthcare Providers: https://www.woods-mathews.com/ This test is not yet approved or cleared by the [place] FDA and  has been authorized for detection and/or diagnosis of SARS-CoV-2 by FDA under an Emergency Use Authorization (EUA). This EUA will remain  in effect (meaning this test can be used) for the duration of the COVID-19 declaration under Section 56 4(b)(1) of the Act, 21 U.S.C. section 360bbb-3(b)(1), unless the authorization is terminated or revoked sooner. Performed at [facility], [place] [address] [place], [place] [address]   CSF culture     Status: None (Preliminary result)   Collection Time: 01/26/20  5:37 PM   Specimen: PATH Cytology CSF; Cerebrospinal Fluid  Result Value Ref Range Status   Specimen Description CSF  Final   Special Requests NONE  Final   Gram Stain   Final    WBC PRESENT, PREDOMINANTLY PMN NO ORGANISMS SEEN CYTOSPIN SMEAR Performed at [facility], [place] [address], [place], [place] [address]    Culture PENDING  Incomplete   Report Status PENDING  Incomplete    Lipid Panel No results for input(s): CHOL, TRIG, HDL, CHOLHDL, VLDL, LDLCALC in the last 72 hours.  Studies/Results: IR Fluoro Guide Ndl Plmt / BX  Result Date: 01/27/2020 CLINICAL DATA:  Confusion, altered mental status EXAM: DIAGNOSTIC LUMBAR PUNCTURE UNDER FLUOROSCOPIC GUIDANCE FLUOROSCOPY TIME:  0.1 minute; 12  uGym2 DAP PROCEDURE: Informed consent was obtained from the family prior to the procedure, including potential complications of headache, allergy, and pain. With the patient prone, the lower back was prepped with Betadine. Intravenous Fentanyl 58mcg and Versed 1mg  were administered as conscious sedation during  continuous monitoring of the patient's level of consciousness and physiological / cardiorespiratory status by the radiology RN, with a total moderate sedation time of 35 minutes. 1% Lidocaine was used for local anesthesia. Lumbar puncture was performed at the L3-4 level from a right parasagittal approach using a 20 gauge needle with return of yellow cloudy CSF with an opening pressure of 13 cm water. 9 ml of CSF were obtained for laboratory studies. The patient tolerated the procedure well and there were no apparent complications. IMPRESSION: 1. Technically successful lumbar puncture under fluoroscopy Electronically Signed   By: Lucrezia Europe M.D.   On: 01/27/2020 07:59   DG CHEST PORT 1 VIEW  Result Date: 01/26/2020 CLINICAL DATA:  Leukocytosis EXAM: PORTABLE CHEST 1 VIEW COMPARISON:  01/24/2020 FINDINGS: The cardiac silhouette, mediastinal and hilar contours are stable. Low lung volumes with vascular crowding and streaky atelectasis. Slight improved lung aeration since the prior study with less vascular congestion and resolving edema. No pleural effusions. IMPRESSION: 1. Low lung volumes with vascular crowding and streaky atelectasis. 2. Improved lung aeration. Electronically Signed   By: Marijo Sanes M.D.   On: 01/26/2020 07:34     Medications:  Scheduled: . chlorhexidine  15 mL Mouth Rinse BID  . enoxaparin (LOVENOX) injection  40 mg Subcutaneous Q24H  . mouth rinse  15 mL Mouth Rinse q12n4p  . sodium chloride flush  10-40 mL Intracatheter Q12H   Continuous: . levETIRAcetam 750 mg (01/26/20 2016)  . meropenem (MERREM) IV 2 g (01/27/20 0230)  . vancomycin 750 mg (01/26/20 2142)      Assessment:  69 year old female with a past medical history of leukemia, non-Hodgkin's lymphoma and hiatal hernia who presented to the ED with altered mental status, fever significantly elevated leukocytosis and rightward gaze preference on admission.  She is  noted to have neck stiffness and imaging suggestive of  meningitis.   1. LP completed yesterday (Monday). CSF glucose severely decreased, WBC markedly elevated at 30,750 and predominantly neutrophilic, protein markedly elevated at 292. The findings are consistent with a bacterial meningitis. CSF culture is pending. Given that she is growing strep pneumonia in her blood, this is also likely in her CSF.   2. LTM EEG is suggestive of epileptogenicity and cortical dysfunction in the left frontotemporal region. Quasi rhythmic delta activity with sharp waves in left frontotemporal region can be on the ictal-interictal continuum. Additionally, there is evidence of cortical dysfunction in right hemisphere likely secondary to post-ictal state, underlying structural abnormality.  No definite seizures were seen throughout the recording. Overall, EEG is not suggestive of nonconvulsive status epilepticus but shows cortical irritability bilaterally  Recommendations: -Continue Keppra 750 mg BID  -Continue meropenem and vancomycin for empiric meningitis coverage.    LOS: 4 days   @Electronically  signed: Dr. Kerney Elbe 01/27/2020  8:34 AM

## 2020-01-27 NOTE — Progress Notes (Signed)
0500-Rapid response RN in room assessing patient.   0515-Per telemetry monitoring ST elevation of 2.3 in V lead. MD notified.

## 2020-01-27 NOTE — Progress Notes (Signed)
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  History is not able to be obtained from the patient given her current condition but is reported that she is not feeling well for last 24 hours and has been running a fever and has been vomiting..  In the ED she is found to be febrile and septic with a temperature of 101, tachycardic as well as an elevated white blood cell count of 78,000.  She is also found to have an acute kidney injury with a creatinine of 3.09.  She did have positive neck rigidity and a CT of the head and CT of the abdomen were done which were unremarkable.  Also noted that she had acute liver injury with elevated LFTs.  Chest x-ray did not show anything and COVID-19 testing was negative.  Urinalysis did show some hyaline casts and some WBCs 11-20 with trace leukocytes.  Lumbar puncture was attempted twice in the ED however was unsuccessful but nonetheless she is started on empiric antibiotics for possible meningitis and admitted for further work-up.  MRI was done and was technically limited but there was diffuse fluid hyperdensity throughout the cerebral sulcal and surrounding brainstem and cerebellum.  While this finding could be artificial nature related to overlying susceptibility artifact however proteinaceous material and changes related meningitis could not be excluded and an LP is to be done but because this is unsuccessful at the bedside will be done under fluoroscopy and I received approval from Dr. Posey Pronto the radiologist for this.  She will be going for her LP soon.  Neurology was consulted and  they have ordered a EEG as well and patient was started on meningitis treatment with vancomycin, meropenem as well as acyclovir.  Her WBC is improving as well as her renal function.  Liver enzymes are also trending down slightly and patient continues to have a metabolic acidosis.  Because of her history of promyelocytic leukemia oncology is consulted and they have no further recommendations at this time from a hematological standpoint.  01/25/2020 she is improving from a laboratory perspective and a little more awake today but was significantly agitated.  Blood cultures are growing Streptococcus pneumonia and will continue on IV meropenem and IV vancomycin.  01/26/2020: Much more awake and alert today and able to tell me that she is in the hospital but she thinks she is in [facility].  Able to tell me her full name and able to converse with me today.  Because she was calmer today we were able to get the IR lumbar puncture.  LTM finished today as well.  Labs are improving however she still hypernatremic but renal function is much improved.   01/27/2020: Patient is still a little out of it and does open her eyes to verbal and physical stimuli but was more somnolent than yesterday.  Still answers questions yes and no today.  Patient lost her IV so IV team was consulted to place new lines.  LP is consistent with bacterial meningitis with CSF culture pending and her long-term EEG was suggestive of epileptogenic city and cortical dysfunction left frontotemporal region.  We will  de-escalate IV vancomycin off and continue meropenem given that this is likely related to strep pneumoniae.  SLP done and recommending strict n.p.o. with ice chips.  She was consulted and recommending that the Osmolite and the patient's diet cannot be advanced.  We'll start her back on D5W given that she is still hypernatremic  Assessment & Plan:   Principal Problem:   Sepsis ([place]) Active Problems:   Essential hypertension   Acute  encephalopathy   Meningitis  Sepsis with concern for meningitis/encephalitis from Streptococcus pneumonia with complications including acute encephalopathy as well as acute renal failure and acute liver injury -MRI as above.  -Dr. Hal Hope Discussed with neurology about the MRI does not show any acute stroke.  -Since ER physician attempted lumbar puncture and was not successful will consult fluoroscopy forLumbar puncture which is still pending to be done as the patient was unable to lay still and will need to attempt under general anesthesia likely -Presently on empiric antibiotics for meningitis/encephalitis and have added Acyvlovir but this was stopped -Continue empiric antibiotics until LP with IV meropenem and IV vancomycin -Follow cultures blood cultures 4 out of 4 Streptococcus pneumonia -Check EEG and neurology recommended long-term EEG -Neurology has been consulted. -Blood Cx showed 4/4 + Cx for Streptococcus Pneumoniae -WBC is now trending down now from 76.4 and now 25.3 -Neurology was consulted and recommended Keppra 750 twice daily and will continue  -Continue seizure precautions next-when she is more stable will obtain a brain MRI with contrast -Underwent EEG monitoring and is not suggestive of nonconvulsive status epilepticus but does show cortical irritable ability bilaterally  -Also went under LTM and is "suggestive of epileptogenicity and cortical dysfunction intheleft frontotemporal region. Quasi rhythmic delta activity with sharp waves in left frontotemporal region can be on the ictal-interictal continuum. Additionally, there is evidence of cortical dysfunction in right hemisphere likely secondary to post-ictal state, underlying structural abnormality. No definite seizures were seen throughout the recording. Overall,EEGisnot suggestive of nonconvulsive status epilepticus but shows cortical irritability bilaterally" -Underwent an LP yesterday and HSV-1 and HSV-2 DNA were  negative; appearance of the CSF was hazy and showed less than 20 glucose, 400 RBCs, 30,750 WBCs, 93% segmented neutrophils, 2% lymphocytes, 5% monocytes-macrophages, CSF color was yellow and her total protein in the CSF of 292 -CSF culture showed no growth to date less than 24 hours -We'll de-escalate and stop IV vancomycin given that this is likely Streptococcus pneumonia causing these issues -Further care per neurology  Significant Leukocytosis, improving as above -With history of promyelocytic leukemia Dr. Burney Gauze oncology has been consulted.  -Oncologist at this time feels that patient's symptoms may be related to infection. -C/w Abx -trending down  History of hypertension  -We will keep patient on as needed IV labetalol for now.  History of GERD.  Elevated LFTs , acute liver injury in the setting of sepsis -In the setting of Sepsis, improving -AST is now 31 and ALT is now 70 and improved from admission -Check acute hepatitis panel and follow LFTs.   Acute encephalopathy improving somewhat -Could be from meningitis encephalitis or sepsis. See #1. -Patient is hypernatremic now and her sodium was trended up to 150s will resume D5W -continue with EEG as per neurology and will need a lumbar puncture and this was done yesterday -Continue to treat with antibiotics and continue to monitor carefully  Acute kidney injury, had initially improved but slightly worsened overnight Metabolic acidosis, mild now  -Related to sepsis  -Continue hydration follow metabolic panel  intake output. -Improving as patient's BUN/creatinine trended down to 23/0.75; now it is 38/1.23 -Given an Amp of Sodium Bicarbonate a few days ago -Restarted D5W at 75 mL's per hour and will need to continue monitor sodiums carefully -Patient's CO2 is now 19, chloride is 10 19, anion gap is 12 -Avoid nephrotoxic medications, contrast dyes, hypotension and renally adjust medications -repeat CMP in  a.m.  Hyperbilirubinemia -Mild -Patient T Vergia Alcon has been trending up slowly and went from 1.1 and today is 2.0 -continue monitor and trend -Repeat CMP in a.m.  History of leukemia. See #2.  Thrombocytopenia -In the setting of sepsis likely reactive  -Patient's platelet count was improving from 126,000 to 179,000 but is now 111,000 -Continue to Monitor for S/Sx of Bleeding -Repeat CBC in AM   Hypophosphatemia -Patient's phosphorus level was 1.6 and improved to 3.1 -continue to monitor and replete as necessary  -Repeat Phos in a.m.  Hypernatremia, Hyperchloremia -Patient sodium is now 150 and her chloride level is 119 -Restarted D5W -Continue to monitor and trend -Repeat CMP in the a.m.  Obesity -Estimated body mass index is 32.51 kg/m as calculated from the following:   Height as of this encounter: 5' 2.64" (1.591 m).   Weight as of this encounter: 82.3 kg. -Will provide Weight Loss and Dietary Counseling when more awake  DVT prophylaxis: SCDs; Will add Enoxaparin 40 mg sq Code Status: FULL CODE   Family Communication: No family present at bedside Disposition Plan: Pending further clinical improvement and will need PT OT evaluations and lumbar puncture as well as clearance by neurology prior to discharge; and that she is still encephalopathic and still waking up and will resume D5W given her hyperchloremia and hypernatremia  Consultants:   Neurology  Oncology  Interventional radiology   Procedures:  EEG LTM   Antimicrobials:  Anti-infectives (From admission, onward)   Start     Dose/Rate Route Frequency Ordered Stop   01/25/20 1200  acyclovir (ZOVIRAX) 610 mg in dextrose 5 % 100 mL IVPB  Status:  Discontinued     610 mg 112.2 mL/hr over 60 Minutes Intravenous Every 8 hours 01/25/20 0924 01/25/20 0933   01/25/20 1000  meropenem (MERREM) 2 g in sodium chloride 0.9 % 100 mL IVPB     2 g 200 mL/hr over 30 Minutes Intravenous Every 8 hours 01/25/20 0919      01/25/20 1000  vancomycin (VANCOREADY) IVPB 750 mg/150 mL  Status:  Discontinued     750 mg 150 mL/hr over 60 Minutes Intravenous Every 12 hours 01/25/20 0919 01/27/20 1547   01/24/20 0230  acyclovir (ZOVIRAX) 680 mg in dextrose 5 % 100 mL IVPB  Status:  Discontinued     680 mg 113.6 mL/hr over 60 Minutes Intravenous Every 24 hours 01/24/20 0137 01/25/20 0924   01/23/20 2200  meropenem (MERREM) 1 g in sodium chloride 0.9 % 100 mL IVPB  Status:  Discontinued     1 g 200 mL/hr over 30 Minutes Intravenous Every 8 hours 01/23/20 2042 01/23/20 2044   01/23/20 2200  meropenem (MERREM) 2 g in sodium chloride 0.9 % 100 mL IVPB  Status:  Discontinued     2 g 200 mL/hr over 30 Minutes Intravenous Every 8 hours 01/23/20 2044 01/23/20 2052   01/23/20 2100  meropenem (MERREM) 1 g in sodium chloride 0.9 % 100 mL IVPB  Status:  Discontinued     1 g 200 mL/hr over 30 Minutes Intravenous Every 12 hours 01/23/20 2052 01/25/20 0919  01/23/20 2055  vancomycin variable dose per unstable renal function (pharmacist dosing)  Status:  Discontinued      Does not apply See admin instructions 01/23/20 2056 01/25/20 0934   01/23/20 2045  vancomycin (VANCOREADY) IVPB 1750 mg/350 mL     1,750 mg 175 mL/hr over 120 Minutes Intravenous  Once 01/23/20 2042 01/24/20 0010     Subjective: Seen and examined at bedside and still remains somewhat encephalopathic.  She wakes up to physical and verbal stimuli but was more alert yesterday.  No nausea or vomiting.  SLP was consulted and recommending strict n.p.o. at this time.  We'll continue to monitor for further clinical improvement and we have restarted the patient on fluids.  Objective: Vitals:   01/27/20 0715 01/27/20 0737 01/27/20 0815 01/27/20 1230  BP: (!) 157/77  (!) 159/80 121/76  Pulse: 93  93   Resp: (!) 35 (!) 26 (!) 23   Temp:      TempSrc:      SpO2: 94%  93% 94%  Weight:      Height:        Intake/Output Summary (Last 24 hours) at 01/27/2020 1554 Last  data filed at 01/27/2020 0600 Gross per 24 hour  Intake --  Output 1050 ml  Net -1050 ml   Filed Weights   01/25/20 0900 01/26/20 0500 01/27/20 0318  Weight: 77.3 kg 81.3 kg 82.3 kg   Examination: Physical Exam:  Constitutional: WN/WD obese female currently in mild distress laying in the bed appears uncomfortable again., Eyes: Lids and conjunctivae normal, sclerae anicteric  ENMT: External Ears, Nose appear normal. Grossly normal hearing opens her eyes to verbal stimuli Neck: Appears normal, supple, no cervical masses, normal ROM, no appreciable thyromegaly neck: No JVD Respiratory: Diminished to auscultation bilaterally with coarse breath sound, no wheezing, rales, rhonchi or crackles.  Slightly tachypneic and using supplemental oxygen via nasal cannula with coarse Cardiovascular: Mildly tachycardic rate, no murmurs / rubs / gallops.  No appreciable lower extremity edema noted Abdomen: Soft, non-tender, distended second body habitus. Bowel sounds positive.  GU: Deferred. Musculoskeletal: No clubbing / cyanosis of digits/nails. No joint deformity upper and lower extremities.  Skin: No rashes, lesions, ulcers on limited skin evaluation but does have a birthmark on her right leg. No induration; Warm and dry.  Neurologic: Opens her eyes to physical verbal stimuli.  Answers questions appropriately but is little drowsy.. Romberg sign and cerebellar reflexes not assessed.  Psychiatric: Impaired judgment and insight.  Appears somewhat confused though and opens her eyes to verbal and physical stimuli.  Data Reviewed: I have personally reviewed following labs and imaging studies  CBC: Recent Labs  Lab 01/24/20 1819 01/25/20 0839 01/25/20 1804 01/26/20 0901 01/27/20 0700  WBC 76.4* 62.1* 51.3* 33.6* 25.3*  NEUTROABS 62.6* 51.5* 42.9* 22.8* 15.4*  HGB 12.8 12.4 12.6 11.6* 13.3  HCT 38.3 39.1 38.0 34.0* 39.9  MCV 81.7 85.4 81.5 79.3* 80.0  PLT 143* 134* 146* 179 [id number]*   Basic Metabolic  Panel: Recent Labs  Lab 01/24/20 1819 01/25/20 0324 01/25/20 0839 01/25/20 1804 01/26/20 0901 01/26/20 1223 01/26/20 1422 01/27/20 1211  NA 149* 147*  --  148*  --  148*  --  150*  K 3.0* 3.7  --  3.9  --  4.4  --  4.5  CL 117* 119*  --  117*  --  118*  --  119*  CO2 19* 17*  --  19*  --  19*  --  19*  GLUCOSE 115* 140*  --  156*  --  135*  --  140*  BUN 36* 31*  --  26*  --  23  --  38*  CREATININE 1.28* 0.93  --  0.73  --  0.75  --  1.23*  CALCIUM 8.2* 8.2*  --  8.4*  --  8.5*  --  8.1*  MG 1.5*  --  2.4 2.2  --   --  2.1 2.3  PHOS 2.7  --  1.8* 2.1* 1.6*  --   --  3.1   GFR: Estimated Creatinine Clearance: 43.5 mL/min (A) (by C-G formula based on SCr of 1.23 mg/dL (H)). Liver Function Tests: Recent Labs  Lab 01/24/20 1819 01/24/20 1819 01/25/20 0324 01/25/20 1804 01/26/20 1223 01/26/20 1422 01/27/20 1211  AST 55*  --  39 22 31  --  32  ALT 143*  --  113* 86* 70*  --  50*  ALKPHOS 120  --  150* 123 117  --  102  BILITOT 1.1   < > 1.1 1.3* QUANTITY NOT SUFFICIENT, UNABLE TO PERFORM TEST 1.4* 2.0*  PROT 5.8*  --  5.5* 5.5* 5.5*  --  5.5*  ALBUMIN 2.7*  --  2.5* 2.4* 2.5*  --  2.1*   < > = values in this interval not displayed.   Recent Labs  Lab 01/23/20 2030  LIPASE 13   No results for input(s): AMMONIA in the last 168 hours. Coagulation Profile: Recent Labs  Lab 01/23/20 2030  INR 1.3*   Cardiac Enzymes: No results for input(s): CKTOTAL, CKMB, CKMBINDEX, TROPONINI in the last 168 hours. BNP (last 3 results) No results for input(s): PROBNP in the last 8760 hours. HbA1C: No results for input(s): HGBA1C in the last 72 hours. CBG: Recent Labs  Lab 01/26/20 1224 01/26/20 1741 01/26/20 2311 01/27/20 0611 01/27/20 1119  GLUCAP 131* 130* 125* 153* 132*   Lipid Profile: No results for input(s): CHOL, HDL, LDLCALC, TRIG, CHOLHDL, LDLDIRECT in the last 72 hours. Thyroid Function Tests: No results for input(s): TSH, T4TOTAL, FREET4, T3FREE, THYROIDAB in  the last 72 hours. Anemia Panel: No results for input(s): VITAMINB12, FOLATE, FERRITIN, TIBC, IRON, RETICCTPCT in the last 72 hours. Sepsis Labs: Recent Labs  Lab 01/23/20 2029  LATICACIDVEN 1.6    Recent Results (from the past 240 hour(s))  Blood Culture (routine x 2)     Status: Abnormal   Collection Time: 01/23/20  8:29 PM   Specimen: BLOOD LEFT HAND  Result Value Ref Range Status   Specimen Description BLOOD LEFT HAND  Final   Special Requests   Final    BOTTLES DRAWN AEROBIC AND ANAEROBIC Blood Culture adequate volume   Culture  Setup Time   Final    GRAM POSITIVE COCCI IN PAIRS IN BOTH AEROBIC AND ANAEROBIC BOTTLES CRITICAL RESULT CALLED TO, READ BACK BY AND VERIFIED WITH: T. BAUMEISTER, PHARMD AT 1120 ON 01/24/20 BY C. JESSUP, MT.    Culture (A)  Final    STREPTOCOCCUS PNEUMONIAE SUSCEPTIBILITIES PERFORMED ON PREVIOUS CULTURE WITHIN THE LAST 5 DAYS. Performed at [facility], [place] [address], [place], [place] [address]    Report Status 01/26/2020 FINAL  Final  Blood Culture (routine x 2)     Status: Abnormal   Collection Time: 01/23/20  8:55 PM   Specimen: BLOOD RIGHT HAND  Result Value Ref Range Status   Specimen Description BLOOD RIGHT HAND  Final   Special Requests   Final  BOTTLES DRAWN AEROBIC AND ANAEROBIC Blood Culture results may not be optimal due to an inadequate volume of blood received in culture bottles   Culture  Setup Time   Final    GRAM POSITIVE COCCI IN PAIRS IN BOTH AEROBIC AND ANAEROBIC BOTTLES CRITICAL RESULT CALLED TO, READ BACK BY AND VERIFIED WITH: T. BAUMEISTER, PHARMD AT 1120 ON 01/24/20 BY C. JESSUP, MT. Performed at [facility], [place] [address] [place], [place] [address]    Culture STREPTOCOCCUS PNEUMONIAE (A)  Final   Report Status 01/26/2020 FINAL  Final   Organism ID, Bacteria STREPTOCOCCUS PNEUMONIAE  Final      Susceptibility   Streptococcus pneumoniae - MIC*    ERYTHROMYCIN <=0.12 SENSITIVE Sensitive     LEVOFLOXACIN  0.5 SENSITIVE Sensitive     VANCOMYCIN 0.25 SENSITIVE Sensitive     PENICILLIN (meningitis) 0.12 RESISTANT Resistant     PENO - penicillin 0.12      PENICILLIN (non-meningitis) 0.12 SENSITIVE Sensitive     PENICILLIN (oral) 0.12 INTERMEDIATE Intermediate     CEFTRIAXONE (non-meningitis) <=0.12 SENSITIVE Sensitive     CEFTRIAXONE (meningitis) <=0.12 SENSITIVE Sensitive     * STREPTOCOCCUS PNEUMONIAE  Blood Culture ID Panel (Reflexed)     Status: Abnormal   Collection Time: 01/23/20  8:55 PM  Result Value Ref Range Status   Enterococcus species NOT DETECTED NOT DETECTED Final   Listeria monocytogenes NOT DETECTED NOT DETECTED Final   Staphylococcus species NOT DETECTED NOT DETECTED Final   Staphylococcus aureus (BCID) NOT DETECTED NOT DETECTED Final   Streptococcus species DETECTED (A) NOT DETECTED Final    Comment: CRITICAL RESULT CALLED TO, READ BACK BY AND VERIFIED WITH: T. BAUMEISTER, PHARMD AT 1120 ON 01/24/20 BY C. JESSUP, MT.    Streptococcus agalactiae NOT DETECTED NOT DETECTED Final   Streptococcus pneumoniae DETECTED (A) NOT DETECTED Final    Comment: CRITICAL RESULT CALLED TO, READ BACK BY AND VERIFIED WITH: T. BAUMEISTER, PHARMD AT 1120 ON 01/24/20 BY C. JESSUP, MT.    Streptococcus pyogenes NOT DETECTED NOT DETECTED Final   Acinetobacter baumannii NOT DETECTED NOT DETECTED Final   Enterobacteriaceae species NOT DETECTED NOT DETECTED Final   Enterobacter cloacae complex NOT DETECTED NOT DETECTED Final   Escherichia coli NOT DETECTED NOT DETECTED Final   Klebsiella oxytoca NOT DETECTED NOT DETECTED Final   Klebsiella pneumoniae NOT DETECTED NOT DETECTED Final   Proteus species NOT DETECTED NOT DETECTED Final   Serratia marcescens NOT DETECTED NOT DETECTED Final   Haemophilus influenzae NOT DETECTED NOT DETECTED Final   Neisseria meningitidis NOT DETECTED NOT DETECTED Final   Pseudomonas aeruginosa NOT DETECTED NOT DETECTED Final   Candida albicans NOT DETECTED NOT DETECTED  Final   Candida glabrata NOT DETECTED NOT DETECTED Final   Candida krusei NOT DETECTED NOT DETECTED Final   Candida parapsilosis NOT DETECTED NOT DETECTED Final   Candida tropicalis NOT DETECTED NOT DETECTED Final    Comment: Performed at [facility], [place]. [address], [place], [place] [address]  Urine culture     Status: None   Collection Time: 01/23/20  9:17 PM   Specimen: In/Out Cath Urine  Result Value Ref Range Status   Specimen Description IN/OUT CATH URINE  Final   Special Requests NONE  Final   Culture   Final    NO GROWTH Performed at [facility], [place] [address], [place], [place] [address]    Report Status 01/25/2020 FINAL  Final  SARS CORONAVIRUS 2 (TAT  6-24 HRS) Nasopharyngeal Nasopharyngeal Swab     Status: None   Collection Time: 01/23/20 11:06 PM   Specimen: Nasopharyngeal Swab  Result Value Ref Range Status   SARS Coronavirus 2 NEGATIVE NEGATIVE Final    Comment: (NOTE) SARS-CoV-2 target nucleic acids are NOT DETECTED. The SARS-CoV-2 RNA is generally detectable in upper and lower respiratory specimens during the acute phase of infection. Negative results do not preclude SARS-CoV-2 infection, do not rule out co-infections with other pathogens, and should not be used as the sole basis for treatment or other patient management decisions. Negative results must be combined with clinical observations, patient history, and epidemiological information. The expected result is Negative. Fact Sheet for Patients: SugarRoll.be Fact Sheet for Healthcare Providers: https://www.woods-mathews.com/ This test is not yet approved or cleared by the [place] FDA and  has been authorized for detection and/or diagnosis of SARS-CoV-2 by FDA under an Emergency Use Authorization (EUA). This EUA will remain  in effect (meaning this test can be used) for the duration of the COVID-19 declaration under Section 56 4(b)(1) of the Act, 21  U.S.C. section 360bbb-3(b)(1), unless the authorization is terminated or revoked sooner. Performed at [facility], [place] [address], [place], [place] [address]   CSF culture     Status: None (Preliminary result)   Collection Time: 01/26/20  5:37 PM   Specimen: PATH Cytology CSF; Cerebrospinal Fluid  Result Value Ref Range Status   Specimen Description CSF  Final   Special Requests NONE  Final   Gram Stain   Final    WBC PRESENT, PREDOMINANTLY PMN NO ORGANISMS SEEN CYTOSPIN SMEAR    Culture   Final    NO GROWTH < 24 HOURS Performed at [place] [address], [place], [place] [address]    Report Status PENDING  Incomplete  Culture, fungus without smear     Status: None (Preliminary result)   Collection Time: 01/26/20  5:37 PM   Specimen: CSF  Result Value Ref Range Status   Specimen Description CSF  Final   Special Requests NONE  Final   Culture   Final    NO GROWTH < 24 HOURS Performed at [facility], [place] [address] [place], [place] [address]    Report Status PENDING  Incomplete     RN Pressure Injury Documentation: Pressure Injury 01/26/20 Sacrum open blister or abrasion (Active)  01/26/20 1800  Location: Sacrum  Location Orientation:   Staging:   Wound Description (Comments): open blister or abrasion  Present on Admission:      Estimated body mass index is 32.51 kg/m as calculated from the following:   Height as of this encounter: 5' 2.64" (1.591 m).   Weight as of this encounter: 82.3 kg.  Malnutrition Type:  Nutrition Problem: Inadequate oral intake Etiology: inability to eat   Malnutrition Characteristics:  Signs/Symptoms: NPO status   Nutrition Interventions:  Interventions: Refer to RD note for recommendations  Radiology Studies: IR Fluoro Guide Ndl Plmt / BX  Result Date: 01/27/2020 CLINICAL DATA:  Confusion, altered mental status EXAM: DIAGNOSTIC LUMBAR PUNCTURE UNDER FLUOROSCOPIC GUIDANCE FLUOROSCOPY TIME:  0.1  minute; 12  uGym2 DAP PROCEDURE: Informed consent was obtained from the family prior to the procedure, including potential complications of headache, allergy, and pain. With the patient prone, the lower back was prepped with Betadine. Intravenous Fentanyl 27mcg and Versed 1mg  were administered as conscious sedation during continuous monitoring of the patient's level of consciousness and physiological / cardiorespiratory  status by the radiology RN, with a total moderate sedation time of 35 minutes. 1% Lidocaine was used for local anesthesia. Lumbar puncture was performed at the L3-4 level from a right parasagittal approach using a 20 gauge needle with return of yellow cloudy CSF with an opening pressure of 13 cm water. 9 ml of CSF were obtained for laboratory studies. The patient tolerated the procedure well and there were no apparent complications. IMPRESSION: 1. Technically successful lumbar puncture under fluoroscopy Electronically Signed   By: Lucrezia Europe M.D.   On: 01/27/2020 07:59   DG CHEST PORT 1 VIEW  Result Date: 01/27/2020 CLINICAL DATA:  SOB (shortness of breath) EXAM: PORTABLE CHEST - 1 VIEW COMPARISON:  the previous day's study FINDINGS: Relatively low lung volumes with resultant crowding of bronchovascular structures. The mild interstitial edema seen previously continues to improve. No new airspace disease. Heart size within normal limits for technique. Aortic Atherosclerosis (ICD10-170.0). No effusion. No pneumothorax. Cervical fixation hardware partially visualized. Surgical clips in the upper abdomen. IMPRESSION: Improving interstitial edema. Electronically Signed   By: Lucrezia Europe M.D.   On: 01/27/2020 11:55   DG CHEST PORT 1 VIEW  Result Date: 01/26/2020 CLINICAL DATA:  Leukocytosis EXAM: PORTABLE CHEST 1 VIEW COMPARISON:  01/24/2020 FINDINGS: The cardiac silhouette, mediastinal and hilar contours are stable. Low lung volumes with vascular crowding and streaky atelectasis. Slight improved  lung aeration since the prior study with less vascular congestion and resolving edema. No pleural effusions. IMPRESSION: 1. Low lung volumes with vascular crowding and streaky atelectasis. 2. Improved lung aeration. Electronically Signed   By: Marijo Sanes M.D.   On: 01/26/2020 07:34   Scheduled Meds:  chlorhexidine  15 mL Mouth Rinse BID   enoxaparin (LOVENOX) injection  40 mg Subcutaneous Q24H   mouth rinse  15 mL Mouth Rinse q12n4p   sodium chloride flush  10-40 mL Intracatheter Q12H   Continuous Infusions:  levETIRAcetam 750 mg (01/27/20 0933)   meropenem (MERREM) IV 2 g (01/27/20 0958)    LOS: 4 days   Kerney Elbe, DO [facility] PAGER is on AMION  If 7PM-7AM, please contact night-coverage [email or web address]

## 2020-01-27 NOTE — Progress Notes (Signed)
MEWS score alternating between 3 and 4-patient noted to be restless in bed and with shallow tachynpneic breathing. No respiratory distress noted. ST when moving in bed. Patient alert and oriented to self, date of birth and aware she is at a hospital. Continues on every 30 minute to one hour blood pressure checks. IV antibiotics infusing. Continue ongoing monitoring and plan of care.

## 2020-01-27 NOTE — Evaluation (Signed)
Clinical/Bedside Swallow Evaluation Patient Details  Name: Kathleen Duran MRN: [id number] Date of Birth: 16-Oct-1951  Today's Date: 01/27/2020 Time: SLP Start Time (ACUTE ONLY): [id number] SLP Stop Time (ACUTE ONLY): 1130 SLP Time Calculation (min) (ACUTE ONLY): 18 min  Past Medical History:  Past Medical History:  Diagnosis Date  . Arthritis   . Cancer [facility])    b cell leukemia  1999 , non hodkins lymphoma 2002  . GERD (gastroesophageal reflux disease)    borderline bleeding ulcer   . History of hiatal hernia   . Urticaria   . Vertigo    Past Surgical History:  Past Surgical History:  Procedure Laterality Date  . ANTERIOR CERVICAL DECOMP/DISCECTOMY FUSION N/A 10/14/2015   Procedure: ANTERIOR CERVICAL DECOMPRESSION/DISCECTOMY FUSION 1 LEVEL;  Surgeon: Phylliss Bob, MD;  Location: [place];  Service: Orthopedics;  Laterality: N/A;  Anterior cervical decompression fusion, cerivcal 4-5 with instrumentation and allograft  . CHOLECYSTECTOMY     2006  . HERNIA REPAIR     umbilical [id number]  . IR FLUORO GUIDED NEEDLE PLC ASPIRATION/INJECTION LOC  01/26/2020  . SHOULDER ARTHROSCOPY WITH BICEPSTENOTOMY Right 04/17/2016   Procedure: SHOULDER ARTHROSCOPY WITH BICEPSTENOTOMY;  Surgeon: Tania Ade, MD;  Location: [place];  Service: Orthopedics;  Laterality: Right;  . SHOULDER ARTHROSCOPY WITH ROTATOR CUFF REPAIR AND SUBACROMIAL DECOMPRESSION Right 04/17/2016   Procedure: SHOULDER ARTHROSCOPY ROTATOR CUFF TEAR AND SUBACROMIAL DECOMPRESSION AND BICEPS TENOTOMY;  Surgeon: Tania Ade, MD;  Location: [place];  Service: Orthopedics;  Laterality: Right;  RIGHT SHOULDER ARTHROSCOPY ROTATOR CUFF TEAR AND SUBACROMIAL DECOMPRESSION  . spleenectomy 1999    . strangulated umbilical hernia [id number]    . 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 Started on empiric antibiotics for possible meningitis and admitted for further work-up.   Assessment / Plan / Recommendation Clinical Impression   Pt upright in bed and cooperative during session. Pt's oral mech revealed reduced lingual strength (CN XII). Pt's voice was breathy/soft which reduced her intelligibility, but remained clear t/o session. Pt was offered ice chips, thin liquids, purees and chopped consistencies. Pt was noted with intermittent throat clearing throughout administration of all trials. With trials of purees and chopped consistencies, pt noted with oral holding, prolonged AP transit and, anteriorly/posteriorly moving bolus around in mouth before swallowing. Following both consistencies, pt was observed with increased WOB and RR. Given s/sx of dysphagia, aspiration and increased WOB with POs, recommend continuing with NPO. Pt may have meds crushed in puree and ice chips sparingly (1-2) following strict oral care. Will continue to follow for PO readiness as clinically indicated.   SLP Visit Diagnosis: Dysphagia, unspecified (R13.10)    Aspiration Risk  Mild aspiration risk    Diet Recommendation NPO;Ice chips PRN after oral care   Medication Administration: Crushed with puree Supervision: Full supervision/cueing for compensatory strategies    Other  Recommendations Oral Care Recommendations: Oral care BID   Follow up Recommendations Other (comment)(TBA)      Frequency and Duration min 2x/week  2 weeks       Prognosis Prognosis for Safe Diet Advancement: Fair Barriers to Reach Goals: Severity of deficits      Swallow Study   General  HPI: 69 year old Caucasian female with a past medical history significant for but not limited to 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 Started on empiric antibiotics for possible meningitis and admitted for further work-up. Type of Study: Bedside Swallow Evaluation Previous Swallow Assessment: none in chart Diet Prior to this Study: NPO Temperature Spikes Noted: No Respiratory Status: Room air History of Recent Intubation: No Behavior/Cognition: Alert;Cooperative;Requires cueing Oral Cavity Assessment: Dry Oral Care Completed by SLP: Recent completion by staff Oral Cavity - Dentition: Adequate natural dentition Vision: Functional for self-feeding Self-Feeding Abilities: Able to feed self Patient Positioning: Upright in bed Baseline Vocal Quality: Breathy Volitional Swallow: Able to elicit    Oral/Motor/Sensory Function Overall Oral Motor/Sensory Function: Mild impairment Facial ROM: Within Functional Limits Facial Symmetry: Within Functional Limits Facial Strength: Within Functional Limits Lingual ROM: Within Functional Limits Lingual Symmetry: Within Functional Limits Lingual Strength: Reduced Mandible: Within Functional Limits   Ice Chips Ice chips: Impaired Presentation: Spoon Oral Phase Impairments: (prolonged AP transit) Oral Phase Functional Implications: Prolonged oral transit   Thin Liquid Thin Liquid: Impaired Presentation: Straw;Spoon;Self Fed Oral Phase Functional Implications: Prolonged oral transit Pharyngeal  Phase Impairments: Throat Clearing - Immediate;Throat Clearing - Delayed    Nectar Thick Nectar Thick Liquid: Not tested   Honey Thick Honey Thick Liquid: Not tested   Puree Puree: Impaired Presentation: Self Fed;Spoon Oral Phase Functional Implications: Oral holding;Prolonged oral transit Pharyngeal Phase Impairments: Throat Clearing - Immediate;Throat Clearing - Delayed;Change in Vital Signs   Solid     Solid: Impaired Presentation: Self Fed;Spoon Oral Phase  Functional Implications: Prolonged oral transit;Oral holding Pharyngeal Phase Impairments: Throat Clearing - Immediate;Throat Clearing - Delayed;Change in Vital Signs     Aline August, Student SLP Office: [phone]  01/27/2020,11:41 AM

## 2020-01-27 NOTE — Progress Notes (Signed)
MEWS score sustaining at [facility] nurse notified as well as rapid response RN. Will notify MD-Patient appears comfortable. Denies any complaints.

## 2020-01-27 NOTE — Progress Notes (Signed)
IV team requested to come to bedside with ultrasound monitor for placement of new IV. While performing rounds, IV on right hand noted to be out. IV on right forearm noted with suspected infiltration. Arm noted to be warm but pink streaking noted above IV insertion site. Area assessed by IV nurse, agrees with possible infiltration. IV removed. IV team nurse will attempt to place 2 new IV lines. Patient denies pain.

## 2020-01-27 NOTE — Progress Notes (Signed)
Initial Nutrition Assessment  **RD working remotely**  DOCUMENTATION CODES:   Obesity unspecified  INTERVENTION:  Monitor for diet advancement and order supplements as appropriate.    If patient is to remain NPO, recommend: -Begin Osmolite 1.2 cal @ 15ml/hr, advance 60ml/hr Q4H until goal rate of 53ml/hr (1234ml) is reached -32ml Pro-stat BID -121ml free water Q4H (625ml), or per MD/PA  Recommended tube feeding will provide 1640 kcal, 96 grams protein, 987ml free water (1655ml with free water flushes)  NUTRITION DIAGNOSIS:   Inadequate oral intake related to inability to eat as evidenced by NPO status.    GOAL:   Patient will meet greater than or equal to 90% of their needs    MONITOR:   Diet advancement, Weight trends, Labs  REASON FOR ASSESSMENT:   Malnutrition Screening Tool    ASSESSMENT:   Pt with a PMH significant for leukemia in remission, non-Hodgkin's lymphoma and hiatal hernia who presented to the ED with altered mental status, fever significantly elevated leukocytosis and rightward gaze preference on admission.  She is noted to have neck stiffness and imaging suggestive of meningitis  3/29 - s/p lumbar puncture, findings consistent with bacterial meningitis 3/30 - pt failed BSE  RD unable to reach pt via phone.   Discussed pt with SLP who states pt has made a lot of progress with mentation since admission. Per SLP, plan is to re-evaluate pt for safety of PO diet tomorrow. If pt is to remain NPO, recommend [facility]. Service available on Mondays, Wednesdays, and Fridays.   Admit weight: 79.8kg Current weight: 82.3 kg  Reviewed weight history in chart. Pt noted to have weighed 77.1 kg on 12/01/19 and 77.3 kg on 12/30/19. All previous weight history from 2019 or earlier. Pt noted to have potential 17% weight loss since 10/31/17, which is not significant for time frame.   Medications reviewed. Labs reviewed: Na 148 (H), CBGs 125-153  UOP: 1,095ml x24  hours I/O: +1,896.71ml since admit  NUTRITION - FOCUSED PHYSICAL EXAM:  RD unable to perform at this time.    Diet Order:   Diet Order            Diet NPO time specified  Diet effective now              EDUCATION NEEDS:   Not appropriate for education at this time  Skin:  Skin Assessment: Skin Integrity Issues: Skin Integrity Issues:: Other (Comment), Incisions Incisions: back Other: pressure injury sacrum  Last BM:  unknown  Height:   Ht Readings from Last 1 Encounters:  01/25/20 5' 2.64" (1.591 m)    Weight:   Wt Readings from Last 2 Encounters:  01/27/20 82.3 kg  04/22/18 92.6 kg    BMI:  Body mass index is 32.51 kg/m.  Estimated Nutritional Needs:   Kcal:  1500-1700  Protein:  90-105 grams  Fluid:  >1.5 L   Larkin Ina, MS, RD, LDN RD pager number and weekend/on-call pager number located in [place].

## 2020-01-28 DIAGNOSIS — R410 Disorientation, unspecified: Secondary | ICD-10-CM

## 2020-01-28 DIAGNOSIS — D72829 Elevated white blood cell count, unspecified: Secondary | ICD-10-CM

## 2020-01-28 DIAGNOSIS — E870 Hyperosmolality and hypernatremia: Secondary | ICD-10-CM | POA: Diagnosis present

## 2020-01-28 DIAGNOSIS — R4182 Altered mental status, unspecified: Secondary | ICD-10-CM | POA: Diagnosis present

## 2020-01-28 DIAGNOSIS — R7881 Bacteremia: Secondary | ICD-10-CM | POA: Diagnosis present

## 2020-01-28 LAB — COMPREHENSIVE METABOLIC PANEL
ALT: 42 U/L (ref 0–44)
AST: 19 U/L (ref 15–41)
Albumin: 2.3 g/dL — ABNORMAL LOW (ref 3.5–5.0)
Alkaline Phosphatase: 96 U/L (ref 38–126)
Anion gap: 14 (ref 5–15)
BUN: 48 mg/dL — ABNORMAL HIGH (ref 8–23)
CO2: 20 mmol/L — ABNORMAL LOW (ref 22–32)
Calcium: 8.7 mg/dL — ABNORMAL LOW (ref 8.9–10.3)
Chloride: 118 mmol/L — ABNORMAL HIGH (ref 98–111)
Creatinine, Ser: 1.95 mg/dL — ABNORMAL HIGH (ref 0.44–1.00)
GFR calc Af Amer: 30 mL/min — ABNORMAL LOW (ref >60)
GFR calc non Af Amer: 26 mL/min — ABNORMAL LOW (ref >60)
Glucose, Bld: 136 mg/dL — ABNORMAL HIGH (ref 70–99)
Potassium: 3.6 mmol/L (ref 3.5–5.1)
Sodium: 152 mmol/L — ABNORMAL HIGH (ref 135–145)
Total Bilirubin: 1 mg/dL (ref 0.3–1.2)
Total Protein: 5.4 g/dL — ABNORMAL LOW (ref 6.5–8.1)

## 2020-01-28 LAB — CBC WITH DIFFERENTIAL/PLATELET
Abs Immature Granulocytes: 0.73 10*3/uL — ABNORMAL HIGH (ref 0.00–0.07)
Basophils Absolute: 0.1 10*3/uL (ref 0.0–0.1)
Basophils Relative: 0 %
Eosinophils Absolute: 0.2 10*3/uL (ref 0.0–0.5)
Eosinophils Relative: 1 %
HCT: 40.4 % (ref 36.0–46.0)
Hemoglobin: 13.4 g/dL (ref 12.0–15.0)
Immature Granulocytes: 3 %
Lymphocytes Relative: 6 %
Lymphs Abs: 1.5 10*3/uL (ref 0.7–4.0)
MCH: 26.9 pg (ref 26.0–34.0)
MCHC: 33.2 g/dL (ref 30.0–36.0)
MCV: 81.1 fL (ref 80.0–100.0)
Monocytes Absolute: 6.7 10*3/uL — ABNORMAL HIGH (ref 0.1–1.0)
Monocytes Relative: 27 %
Neutro Abs: 15.2 10*3/uL — ABNORMAL HIGH (ref 1.7–7.7)
Neutrophils Relative %: 63 %
Platelets: 187 10*3/uL (ref 150–400)
RBC: 4.98 MIL/uL (ref 3.87–5.11)
RDW: 17.1 % — ABNORMAL HIGH (ref 11.5–15.5)
WBC: 24.4 10*3/uL — ABNORMAL HIGH (ref 4.0–10.5)
nRBC: 0 % (ref 0.0–0.2)

## 2020-01-28 LAB — MAGNESIUM: Magnesium: 2.3 mg/dL (ref 1.7–2.4)

## 2020-01-28 LAB — PATHOLOGIST SMEAR REVIEW

## 2020-01-28 LAB — PHOSPHORUS: Phosphorus: 3.3 mg/dL (ref 2.5–4.6)

## 2020-01-28 LAB — GLUCOSE, CAPILLARY
Glucose-Capillary: 113 mg/dL — ABNORMAL HIGH (ref 70–99)
Glucose-Capillary: 135 mg/dL — ABNORMAL HIGH (ref 70–99)
Glucose-Capillary: 120 mg/dL — ABNORMAL HIGH (ref 70–99)
Glucose-Capillary: 98 mg/dL (ref 70–99)

## 2020-01-28 MED ORDER — PANTOPRAZOLE SODIUM 40 MG PO TBEC
40.0000 mg | DELAYED_RELEASE_TABLET | Freq: Every day | ORAL | Status: DC
  Administered 2020-01-28 – 2020-02-05 (×9): 40 mg via ORAL
  Filled 2020-01-28 (×9): qty 1

## 2020-01-28 MED ORDER — DEXTROSE-NACL 5-0.45 % IV SOLN: INTRAVENOUS | Status: AC

## 2020-01-28 MED ORDER — METOPROLOL TARTRATE 25 MG PO TABS
25.0000 mg | ORAL_TABLET | Freq: Every day | ORAL | Status: DC
  Administered 2020-01-28 – 2020-01-31 (×4): 25 mg via ORAL
  Filled 2020-01-28 (×4): qty 1

## 2020-01-28 MED ORDER — SODIUM CHLORIDE 0.9 % IV SOLN
2.0000 g | Freq: Two times a day (BID) | INTRAVENOUS | Status: DC
  Administered 2020-01-28 – 2020-01-29 (×2): 2 g via INTRAVENOUS
  Filled 2020-01-28 (×3): qty 2

## 2020-01-28 NOTE — Evaluation (Signed)
Speech Language Pathology Evaluation Patient Details Name: Kathleen Duran MRN: [id number] DOB: 23-May-1951 Today's Date: 01/28/2020 Time: [id number] SLP Time Calculation (min) (ACUTE ONLY): 25 min  Problem List:  Patient Active Problem List   Diagnosis Date Noted  . Essential hypertension 01/24/2020  . Acute encephalopathy 01/24/2020  . Meningitis 01/24/2020  . AKI (acute kidney injury) ([place])   . Sepsis ([place]) 01/23/2020  . Cough, persistent 04/22/2018  . Wheezing 04/22/2018  . Perennial allergic rhinitis with possible nonallergic component 04/22/2018  . Laryngopharyngeal reflux 04/22/2018  . Radiculopathy 10/14/2015   Past Medical History:  Past Medical History:  Diagnosis Date  . Arthritis   . Cancer [facility])    b cell leukemia  1999 , non hodkins lymphoma 2002  . GERD (gastroesophageal reflux disease)    borderline bleeding ulcer   . History of hiatal hernia   . Urticaria   . Vertigo    Past Surgical History:  Past Surgical History:  Procedure Laterality Date  . ANTERIOR CERVICAL DECOMP/DISCECTOMY FUSION N/A 10/14/2015   Procedure: ANTERIOR CERVICAL DECOMPRESSION/DISCECTOMY FUSION 1 LEVEL;  Surgeon: Phylliss Bob, MD;  Location: [place];  Service: Orthopedics;  Laterality: N/A;  Anterior cervical decompression fusion, cerivcal 4-5 with instrumentation and allograft  . CHOLECYSTECTOMY     2006  . HERNIA REPAIR     umbilical [id number]  . IR FLUORO GUIDED NEEDLE PLC ASPIRATION/INJECTION LOC  01/26/2020  . SHOULDER ARTHROSCOPY WITH BICEPSTENOTOMY Right 04/17/2016   Procedure: SHOULDER ARTHROSCOPY WITH BICEPSTENOTOMY;  Surgeon: Tania Ade, MD;  Location: [place];  Service: Orthopedics;  Laterality: Right;  . SHOULDER ARTHROSCOPY WITH ROTATOR CUFF REPAIR AND SUBACROMIAL DECOMPRESSION Right 04/17/2016   Procedure: SHOULDER ARTHROSCOPY ROTATOR CUFF TEAR AND SUBACROMIAL DECOMPRESSION AND BICEPS TENOTOMY;  Surgeon: Tania Ade, MD;  Location: [place];  Service: Orthopedics;  Laterality: Right;  RIGHT SHOULDER ARTHROSCOPY ROTATOR CUFF TEAR AND SUBACROMIAL DECOMPRESSION  . spleenectomy 1999    . strangulated umbilical hernia [id number]    . 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 Started on empiric antibiotics for possible meningitis and admitted for further work-up.   Assessment / Plan / Recommendation Clinical Impression  Pt participated in speech/language/cognition evaluation. She reported that she is retired and previously worked "in [employer] but was unable to describe what her job entailed. She denied any baseline cognitive-linguistic deficits and stated that she independently managed her medications and finances prior to admission. Pt denied any acute changes in speech, lanaguge, or cognition but her overall reliability as a historian is questioned.   The [facility] Mental Status Examination was completed to evaluate the pt's cognitive-linguistic skills. She achieved a score of 10/30 which is below the normal limits of 27 or more out of 30. She exhibited difficulty in the areas of awareness, attention, memory, executive function, mental manipulation, and problem solving. Her language skills appeared functional but her communication was negatively impacted by deficits in attention and memory. Articulatory precision was imprecise and repetition was inconsistently needed due to reduced speech intelligibility. Skilled SLP services are clinically indicated at this time to improve cognitive-linguistic and motor speech function.    SLP Assessment  SLP Recommendation/Assessment: Patient needs continued [place] [facility]  Services SLP Visit  Diagnosis: Cognitive communication deficit (R41.841)    Follow Up Recommendations  Other (comment)(TBA)    Frequency and Duration min 2x/week  2 weeks      SLP Evaluation Cognition  Overall Cognitive Status: Impaired/Different from baseline Arousal/Alertness: Awake/alert Orientation Level: Oriented to person;Oriented to place;Disoriented to time Attention: Focused;Sustained Focused Attention: Impaired Focused Attention Impairment: Verbal complex Sustained Attention: Impaired Sustained Attention Impairment: Verbal complex Memory: Impaired Memory Impairment: Storage deficit;Retrieval deficit;Decreased recall of new information(Immediate: 3/5; delayed: 2/5) Awareness: Impaired Awareness Impairment: Intellectual impairment Problem Solving: Impaired Problem Solving Impairment: Verbal basic;Functional basic Executive Function: Reasoning Reasoning: Impaired Reasoning Impairment: Verbal complex       Comprehension  Auditory Comprehension Overall Auditory Comprehension: Appears within functional limits for tasks assessed Yes/No Questions: Within Functional Limits Commands: Within Functional Limits Conversation: Complex Interfering Components: Attention;Processing speed;Working Curator: Not tested Reading Comprehension Reading Status: Not tested    Expression Expression Primary Mode of Expression: Verbal Verbal Expression Overall Verbal Expression: Appears within functional limits for tasks assessed Initiation: No impairment Automatic Speech: Counting;Day of week;Month of year(WNL) Level of Generative/Spontaneous Verbalization: Phrase;Sentence Repetition: No impairment Naming: No impairment Pragmatics: No impairment Interfering Components: Attention   Oral / Motor  Oral Motor/Sensory Function Overall Oral Motor/Sensory Function: Mild impairment Facial ROM: Within Functional Limits Facial Symmetry: Within Functional  Limits Lingual Strength: Reduced;Suspected CN XII (hypoglossal) dysfunction Motor Speech Overall Motor Speech: Impaired Respiration: Within functional limits Phonation: Normal Resonance: Within functional limits Articulation: Impaired Level of Impairment: Conversation Intelligibility: Intelligibility reduced Word: 75-100% accurate Phrase: 75-100% accurate Sentence: 75-100% accurate Conversation: 75-100% accurate Motor Planning: Witnin functional limits Motor Speech Errors: Aware;Consistent   Twyla Dais I. Hardin Negus, [place], [place] Office number [phone] Pager [place] 01/28/2020, 12:55 PM

## 2020-01-28 NOTE — Progress Notes (Addendum)
NEUROLOGY PROGRESS NOTE   Subjective: Currently patient is very confused.  She is very adamant about leaving the hospital.  It is clear that she does not understand the gravity of the infection she has nor does she believe that she has meningitis.  Family at bedside trying to calm her down.  Exam: Vitals:   01/28/20 0409 01/28/20 0826  BP: (!) 156/76 (!) 145/79  Pulse:    Resp: 20 (!) 24  Temp: 98.6 F (37 C) 98.1 F (36.7 C)  SpO2: 100% 100%    Physical Exam  Constitutional: Appears well-developed and well-nourished.  Psych: Affect and appropriate for situation-adamant about leaving AMA however she is very confused at this point Eyes: No scleral injection HENT: No OP obstrucion Head: Normocephalic.  Cardiovascular: Tachycardic Respiratory: Effort normal, non-labored breathing GI: Soft.  No distension. There is no tenderness.  Skin: WDI  Neuro:  Mental Status: Alert, not oriented, very flustered, does not understand nor believe that she actually has meningitis, threatening to leave AMA. Cranial Nerves: II:  Visual fields grossly normal. PERRL.  III,IV, VI: ptosis not present, EOMI V,VII: Face symmetrical VIII: hearing normal bilaterally Motor: Moving all extremities antigravity Sensory: Pinprick and light touch intact throughout, bilaterally   Medications:  Scheduled: . chlorhexidine  15 mL Mouth Rinse BID  . enoxaparin (LOVENOX) injection  40 mg Subcutaneous Q24H  . mouth rinse  15 mL Mouth Rinse q12n4p  . sodium chloride flush  10-40 mL Intracatheter Q12H    Pertinent Labs/Diagnostics:   IR Fluoro Guide Ndl Plmt / BX  Result Date: 01/27/2020 CLINICAL DATA:  Confusion, altered mental status EXAM: DIAGNOSTIC LUMBAR PUNCTURE UNDER FLUOROSCOPIC GUIDANCE FLUOROSCOPY TIME:  0.1 minute; 12  uGym2 DAP PROCEDURE: Informed consent was obtained from the family prior to the procedure, including potential complications of headache, allergy, and pain. With the patient  prone, the lower back was prepped with Betadine. Intravenous Fentanyl 68mcg and Versed 1mg  were administered as conscious sedation during continuous monitoring of the patient's level of consciousness and physiological / cardiorespiratory status by the radiology RN, with a total moderate sedation time of 35 minutes. 1% Lidocaine was used for local anesthesia. Lumbar puncture was performed at the L3-4 level from a right parasagittal approach using a 20 gauge needle with return of yellow cloudy CSF with an opening pressure of 13 cm water. 9 ml of CSF were obtained for laboratory studies. The patient tolerated the procedure well and there were no apparent complications. IMPRESSION: 1. Technically successful lumbar puncture under fluoroscopy Electronically Signed   By: Lucrezia Europe M.D.   On: 01/27/2020 07:59   DG CHEST PORT 1 VIEW  Result Date: 01/27/2020 CLINICAL DATA:  Shortness of breath. History of leukemia. EXAM: PORTABLE CHEST 1 VIEW COMPARISON:  01/27/2020 FINDINGS: Midline trachea. Borderline cardiomegaly. Atherosclerosis in the transverse aorta. No pleural effusion or pneumothorax. Surgical clips in the left upper quadrant. No congestive failure. No lobar consolidation. IMPRESSION: Borderline cardiomegaly, without acute disease. Aortic Atherosclerosis (ICD10-I70.0). Electronically Signed   By: Abigail Miyamoto M.D.   On: 01/27/2020 19:16   DG CHEST PORT 1 VIEW  Result Date: 01/27/2020 CLINICAL DATA:  SOB (shortness of breath) EXAM: PORTABLE CHEST - 1 VIEW COMPARISON:  the previous day's study FINDINGS: Relatively low lung volumes with resultant crowding of bronchovascular structures. The mild interstitial edema seen previously continues to improve. No new airspace disease. Heart size within normal limits for technique. Aortic Atherosclerosis (ICD10-170.0). No effusion. No pneumothorax. Cervical fixation hardware partially visualized. Surgical clips  in the upper abdomen. IMPRESSION: Improving interstitial  edema. Electronically Signed   By: Lucrezia Europe M.D.   On: 01/27/2020 11:55   Etta Quill PA-C [facility] [phone]  Assessment:18 year oldfemalewithapast medical history of leukemia, non-Hodgkin's lymphomaandhiatal hernia whopresented with meningitis.  1.LP completed on Tuesday. CSF glucose was severely decreased, WBC markedly elevated at 30,750 and predominantly neutrophilic, protein markedly elevated at 292. The findings were consistent with a bacterial meningitis. CSF culture with no growth x 2 days. Given that she is growing strep pneumonia in her blood, this is also likely in her CSF.  2. LTMEEGwas suggestive of epileptogenicity and cortical dysfunction intheleft frontotemporal region. No definite seizures were seen throughout the recording.  3. On today's exam patient is much more alert. However, she is very confused, she is very agitated and she is threatening to leave AMA. She is not competent to sign papers.  Recommendations: -Continue Keppra750 mg BID  -Continuemeropenem for empiric meningitis coverage. -At this point neurology will follow on a PRN basis.   Electronically signed: Dr. Kerney Elbe 01/28/2020, 10:40 AM

## 2020-01-28 NOTE — Progress Notes (Signed)
ST elevation per telemetry, Dr. Lonny Prude notified

## 2020-01-28 NOTE — Progress Notes (Signed)
[facility]  PROGRESS NOTE  Kathleen Duran [id number] DOB: 1951/07/10 DOA: 01/23/2020 PCP: Arlyss Repress, MD Admit date - 01/23/2020   Admitting Physician Kerney Elbe, DO  Outpatient Primary MD for the patient is Arlyss Repress, MD  LOS - 5 Brief Narrative   Kathleen Duran is a 69 y.o. year old female with medical history significant forpromyelocytic leukemia under remission, hypertension, GERD  who presented on 01/23/2020 with fever, sudden confusion x 24 hours and vomiting and was found to have nuchal rigidity, leukocytosis of 70,000, and creatinine of 3.09 (normal in October) and admitted with working diagnosis of sepsis secondary to presumed meningitis.  Underwent unsuccessful LP in ED and started on empiric antibiotics for meningitis  Hospital course complicated by altered mental status concern for meningitis based on MRI brain imaging.  Patient underwent LP on 3/29 which showed markedly elevated WBC of greater than 30,000 predominant neutrophilic, elevated protein of 292, and depressed glucose consistent with bacterial meningitis.HSV1/2 negative  Subjective  Today patient having intermittent episodes of confusion and threatened to leave [place].  On my examination with husband at bedside patient much more calm and oriented to self and place.  A & P   Altered mental status secondary to meningitis, likely strep pneumo Has intermittent episodes of confusion, but currently alert and oriented x3 significantly improved from last few days of hospital stay.  Remains afebrile, based off of LP likely bacterial, given strep pneumo and blood this is likely etiology -Currently on meropenem, DC'd vancomycin, ampicillin, and acyclovir -Monitor CSF cultures -Patient IVC'd, does not have competency to leave AGAINST MEDICAL ADVICE  Sepsis secondary to Strep pneumo bacteremia with complicated meningitis/encephalitis and AKI, improving Likely source is sinusitis  given degree of opacification on CT head imaging. Likely culprit of meningitis.  Leukocytosis downtrending remains afebrile, mental status improving -Daily CBC -Antibiotics as mentioned above  Elevated LFTs, acute liver injury in the setting of sepsis, resolved Peak ALT of 240, and AST 161.  Now back to normal baseline.  L TM concerning for epileptogenicity No definite seizures seen on LTM EEG recording -Roger recommends continuing Keppra 750 mg twice daily  Hypernatremia, in the setting of diminished p.o. intake -Currently on D5 half-normal/expect improvement now that patient able to tolerate diet (dysphagia 2 diet) -Repeat BMP in a.m.  AKI, improving.  Peak of 3.2, currently 1.95.  Suspect likely combination of contrast/diminished p.o. intake.  Continue IV fluids as mentioned above -Repeat BMP, avoid nephrotoxins -Holding home irbesartan -Monitor output  HTN, slightly elevated with SBP's in the 150s likely in setting of holding home BP meds. -Resume home Lopressor at 25 mg qd (reduced from home dose) given concomitant tachycardia and closely monitor -Hold home irbesartan given AKI  GERD, stable -Continue home PPI  History of B-cell prolymphocytic leukemia, status post colectomy and chemotherapy Evaluated by oncology this admission, per their review peripheral smear seems more reactive, doubt relapse, likely all related to meningitis above.  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 Anticipated d/c date:  3-4 days. Barriers to d/c or necessity for inpatient status:  Continue IV meropenem for bacterial meningitis, close monitoring mental status intermittent episodes of confusion Consults  : Neurology, oncology  Procedures  : 3/29, LP under fluoroscopy  DVT Prophylaxis  :  Lovenox  Lab Results  Component  Value Date    PLT 187 01/28/2020    Diet :  Diet Order            DIET DYS 2 Room service appropriate? Yes with Assist; Fluid consistency: Thin  Diet effective now               Inpatient Medications Scheduled Meds: . chlorhexidine  15 mL Mouth Rinse BID  . enoxaparin (LOVENOX) injection  40 mg Subcutaneous Q24H  . mouth rinse  15 mL Mouth Rinse q12n4p  . sodium chloride flush  10-40 mL Intracatheter Q12H   Continuous Infusions: . dextrose 5 % and 0.45% NaCl 75 mL/hr at 01/28/20 0858  . levETIRAcetam 750 mg (01/28/20 0848)  . meropenem (MERREM) IV 2 g (01/28/20 1337)   PRN Meds:.acetaminophen, haloperidol lactate, labetalol, levalbuterol, LORazepam, ondansetron **OR** ondansetron (ZOFRAN) IV, sodium chloride flush  Antibiotics  :   Anti-infectives (From admission, onward)   Start     Dose/Rate Route Frequency Ordered Stop   01/28/20 1400  meropenem (MERREM) 2 g in sodium chloride 0.9 % 100 mL IVPB     2 g 200 mL/hr over 30 Minutes Intravenous Every 12 hours 01/28/20 1039     01/25/20 1200  acyclovir (ZOVIRAX) 610 mg in dextrose 5 % 100 mL IVPB  Status:  Discontinued     610 mg 112.2 mL/hr over 60 Minutes Intravenous Every 8 hours 01/25/20 0924 01/25/20 0933   01/25/20 1000  meropenem (MERREM) 2 g in sodium chloride 0.9 % 100 mL IVPB  Status:  Discontinued     2 g 200 mL/hr over 30 Minutes Intravenous Every 8 hours 01/25/20 0919 01/28/20 1039   01/25/20 1000  vancomycin (VANCOREADY) IVPB 750 mg/150 mL  Status:  Discontinued     750 mg 150 mL/hr over 60 Minutes Intravenous Every 12 hours 01/25/20 0919 01/27/20 1547   01/24/20 0230  acyclovir (ZOVIRAX) 680 mg in dextrose 5 % 100 mL IVPB  Status:  Discontinued     680 mg 113.6 mL/hr over 60 Minutes Intravenous Every 24 hours 01/24/20 0137 01/25/20 0924   01/23/20 2200  meropenem (MERREM) 1 g in sodium chloride 0.9 % 100 mL IVPB  Status:  Discontinued     1 g 200 mL/hr over 30 Minutes Intravenous Every 8 hours 01/23/20 2042 01/23/20 2044     01/23/20 2200  meropenem (MERREM) 2 g in sodium chloride 0.9 % 100 mL IVPB  Status:  Discontinued     2 g 200 mL/hr over 30 Minutes Intravenous Every 8 hours 01/23/20 2044 01/23/20 2052   01/23/20 2100  meropenem (MERREM) 1 g in sodium chloride 0.9 % 100 mL IVPB  Status:  Discontinued     1 g 200 mL/hr over 30 Minutes Intravenous Every 12 hours 01/23/20 2052 01/25/20 0919   01/23/20 2055  vancomycin variable dose per unstable renal function (pharmacist dosing)  Status:  Discontinued      Does not apply See admin instructions 01/23/20 2056 01/25/20 0934   01/23/20 2045  vancomycin (VANCOREADY) IVPB 1750 mg/350 mL     1,750 mg 175 mL/hr over 120 Minutes Intravenous  Once 01/23/20 2042 01/24/20 0010       Objective   Vitals:   01/28/20 0301 01/28/20 0409 01/28/20 0826 01/28/20 1146  BP: 134/76 (!) 156/76 (!) 145/79 (!) 156/76  Pulse: (!) 108   (!) 102  Resp: (!) 28 20 (!) 24 20  Temp: 98.5 F (36.9 C) 98.6 F (37 C) 98.1 F (36.7  C) 98.2 F (36.8 C)  TempSrc: Oral Oral Oral Axillary  SpO2: 99% 100% 100% 100%  Weight: 80 kg     Height:        SpO2: 100 % O2 Flow Rate (L/min): 3 L/min  Wt Readings from Last 3 Encounters:  01/28/20 80 kg  04/22/18 92.6 kg  04/17/16 95.4 kg     Intake/Output Summary (Last 24 hours) at 01/28/2020 1607 Last data filed at 01/28/2020 0300 Gross per 24 hour  Intake 544.61 ml  Output 1200 ml  Net -655.39 ml    Physical Exam:     Awake Alert, Oriented X to self, not to place, time (year, month, season), Follow simple commands [place].AT, Normal respiratory effort on , CTAB RRR,No Gallops,Rubs or new Murmurs,  +ve B.Sounds, Abd Soft, No tenderness, No rebound, guarding or rigidity.    I have personally reviewed the following:   Data Reviewed:  CBC Recent Labs  Lab 01/25/20 0839 01/25/20 1804 01/26/20 0901 01/27/20 0700 01/28/20 0421  WBC 62.1* 51.3* 33.6* 25.3* 24.4*  HGB 12.4 12.6 11.6* 13.3 13.4  HCT 39.1 38.0 34.0* 39.9  40.4  PLT 134* 146* 179 111* 187  MCV 85.4 81.5 79.3* 80.0 81.1  MCH 27.1 27.0 27.0 26.7 26.9  MCHC 31.7 33.2 34.1 33.3 33.2  RDW 17.8* 17.3* 16.7* 17.7* 17.1*  LYMPHSABS 1.2 1.2 6.4* 3.3 1.5  MONOABS 7.8* 6.0* 2.4* 6.6* 6.7*  EOSABS 0.0 0.0 1.7* 0.0 0.2  BASOSABS 0.0 0.2* 0.3* 0.0 0.1    Chemistries  Recent Labs  Lab 01/24/20 1819 01/25/20 0324 01/25/20 0324 01/25/20 0839 01/25/20 1804 01/26/20 1223 01/26/20 1422 01/27/20 1211 01/28/20 0421  NA  --  147*  --   --  148* 148*  --  150* 152*  K  --  3.7  --   --  3.9 4.4  --  4.5 3.6  CL  --  119*  --   --  117* 118*  --  119* 118*  CO2  --  17*  --   --  19* 19*  --  19* 20*  GLUCOSE  --  140*  --   --  156* 135*  --  140* 136*  BUN  --  31*  --   --  26* 23  --  38* 48*  CREATININE  --  0.93  --   --  0.73 0.75  --  1.23* 1.95*  CALCIUM  --  8.2*  --   --  8.4* 8.5*  --  8.1* 8.7*  MG   < >  --   --  2.4 2.2  --  2.1 2.3 2.3  AST  --  39  --   --  22 31  --  32 19  ALT  --  113*  --   --  86* 70*  --  50* 42  ALKPHOS  --  150*  --   --  123 117  --  102 96  BILITOT  --  1.1   < >  --  1.3* QUANTITY NOT SUFFICIENT, UNABLE TO PERFORM TEST 1.4* 2.0* 1.0   < > = values in this interval not displayed.   ------------------------------------------------------------------------------------------------------------------ No results for input(s): CHOL, HDL, LDLCALC, TRIG, CHOLHDL, LDLDIRECT in the last 72 hours.  No results found for: HGBA1C ------------------------------------------------------------------------------------------------------------------ No results for input(s): TSH, T4TOTAL, T3FREE, THYROIDAB in the last 72 hours.  Invalid input(s): FREET3 ------------------------------------------------------------------------------------------------------------------ No results for input(s): VITAMINB12, FOLATE, FERRITIN, TIBC,  IRON, RETICCTPCT in the last 72 hours.  Coagulation profile Recent Labs  Lab 01/23/20 2030  INR  1.3*    No results for input(s): DDIMER in the last 72 hours.  Cardiac Enzymes No results for input(s): CKMB, TROPONINI, MYOGLOBIN in the last 168 hours.  Invalid input(s): CK ------------------------------------------------------------------------------------------------------------------ No results found for: BNP  Micro Results Recent Results (from the past 240 hour(s))  Blood Culture (routine x 2)     Status: Abnormal   Collection Time: 01/23/20  8:29 PM   Specimen: BLOOD LEFT HAND  Result Value Ref Range Status   Specimen Description BLOOD LEFT HAND  Final   Special Requests   Final    BOTTLES DRAWN AEROBIC AND ANAEROBIC Blood Culture adequate volume   Culture  Setup Time   Final    GRAM POSITIVE COCCI IN PAIRS IN BOTH AEROBIC AND ANAEROBIC BOTTLES CRITICAL RESULT CALLED TO, READ BACK BY AND VERIFIED WITH: T. BAUMEISTER, PHARMD AT 1120 ON 01/24/20 BY C. JESSUP, MT.    Culture (A)  Final    STREPTOCOCCUS PNEUMONIAE SUSCEPTIBILITIES PERFORMED ON PREVIOUS CULTURE WITHIN THE LAST 5 DAYS. Performed at [facility], [place] [address], [place], [place] [address]    Report Status 01/26/2020 FINAL  Final  Blood Culture (routine x 2)     Status: Abnormal   Collection Time: 01/23/20  8:55 PM   Specimen: BLOOD RIGHT HAND  Result Value Ref Range Status   Specimen Description BLOOD RIGHT HAND  Final   Special Requests   Final    BOTTLES DRAWN AEROBIC AND ANAEROBIC Blood Culture results may not be optimal due to an inadequate volume of blood received in culture bottles   Culture  Setup Time   Final    GRAM POSITIVE COCCI IN PAIRS IN BOTH AEROBIC AND ANAEROBIC BOTTLES CRITICAL RESULT CALLED TO, READ BACK BY AND VERIFIED WITH: T. BAUMEISTER, PHARMD AT 1120 ON 01/24/20 BY C. JESSUP, MT. Performed at [facility], [place] [address], [place], [place] [address]    Culture STREPTOCOCCUS PNEUMONIAE (A)  Final   Report Status 01/26/2020 FINAL  Final   Organism ID, Bacteria  STREPTOCOCCUS PNEUMONIAE  Final      Susceptibility   Streptococcus pneumoniae - MIC*    ERYTHROMYCIN <=0.12 SENSITIVE Sensitive     LEVOFLOXACIN 0.5 SENSITIVE Sensitive     VANCOMYCIN 0.25 SENSITIVE Sensitive     PENICILLIN (meningitis) 0.12 RESISTANT Resistant     PENO - penicillin 0.12      PENICILLIN (non-meningitis) 0.12 SENSITIVE Sensitive     PENICILLIN (oral) 0.12 INTERMEDIATE Intermediate     CEFTRIAXONE (non-meningitis) <=0.12 SENSITIVE Sensitive     CEFTRIAXONE (meningitis) <=0.12 SENSITIVE Sensitive     * STREPTOCOCCUS PNEUMONIAE  Blood Culture ID Panel (Reflexed)     Status: Abnormal   Collection Time: 01/23/20  8:55 PM  Result Value Ref Range Status   Enterococcus species NOT DETECTED NOT DETECTED Final   Listeria monocytogenes NOT DETECTED NOT DETECTED Final   Staphylococcus species NOT DETECTED NOT DETECTED Final   Staphylococcus aureus (BCID) NOT DETECTED NOT DETECTED Final   Streptococcus species DETECTED (A) NOT DETECTED Final    Comment: CRITICAL RESULT CALLED TO, READ BACK BY AND VERIFIED WITH: T. BAUMEISTER, PHARMD AT 1120 ON 01/24/20 BY C. JESSUP, MT.    Streptococcus agalactiae NOT DETECTED NOT DETECTED Final   Streptococcus pneumoniae DETECTED (A) NOT DETECTED Final    Comment: CRITICAL RESULT CALLED TO, READ BACK BY AND VERIFIED WITH:  T. BAUMEISTER, PHARMD AT 1120 ON 01/24/20 BY C. JESSUP, MT.    Streptococcus pyogenes NOT DETECTED NOT DETECTED Final   Acinetobacter baumannii NOT DETECTED NOT DETECTED Final   Enterobacteriaceae species NOT DETECTED NOT DETECTED Final   Enterobacter cloacae complex NOT DETECTED NOT DETECTED Final   Escherichia coli NOT DETECTED NOT DETECTED Final   Klebsiella oxytoca NOT DETECTED NOT DETECTED Final   Klebsiella pneumoniae NOT DETECTED NOT DETECTED Final   Proteus species NOT DETECTED NOT DETECTED Final   Serratia marcescens NOT DETECTED NOT DETECTED Final   Haemophilus influenzae NOT DETECTED NOT DETECTED Final    Neisseria meningitidis NOT DETECTED NOT DETECTED Final   Pseudomonas aeruginosa NOT DETECTED NOT DETECTED Final   Candida albicans NOT DETECTED NOT DETECTED Final   Candida glabrata NOT DETECTED NOT DETECTED Final   Candida krusei NOT DETECTED NOT DETECTED Final   Candida parapsilosis NOT DETECTED NOT DETECTED Final   Candida tropicalis NOT DETECTED NOT DETECTED Final    Comment: Performed at [facility], [place] [address], [place], [place] [address]  Urine culture     Status: None   Collection Time: 01/23/20  9:17 PM   Specimen: In/Out Cath Urine  Result Value Ref Range Status   Specimen Description IN/OUT CATH URINE  Final   Special Requests NONE  Final   Culture   Final    NO GROWTH Performed at [facility], [place] [address] [place], [place] [address]    Report Status 01/25/2020 FINAL  Final  SARS CORONAVIRUS 2 (TAT 6-24 HRS) Nasopharyngeal Nasopharyngeal Swab     Status: None   Collection Time: 01/23/20 11:06 PM   Specimen: Nasopharyngeal Swab  Result Value Ref Range Status   SARS Coronavirus 2 NEGATIVE NEGATIVE Final    Comment: (NOTE) SARS-CoV-2 target nucleic acids are NOT DETECTED. The SARS-CoV-2 RNA is generally detectable in upper and lower respiratory specimens during the acute phase of infection. Negative results do not preclude SARS-CoV-2 infection, do not rule out co-infections with other pathogens, and should not be used as the sole basis for treatment or other patient management decisions. Negative results must be combined with clinical observations, patient history, and epidemiological information. The expected result is Negative. Fact Sheet for Patients: SugarRoll.be Fact Sheet for Healthcare Providers: https://www.woods-mathews.com/ This test is not yet approved or cleared by the [place] FDA and  has been authorized for detection and/or diagnosis of SARS-CoV-2 by FDA under an Emergency Use Authorization  (EUA). This EUA will remain  in effect (meaning this test can be used) for the duration of the COVID-19 declaration under Section 56 4(b)(1) of the Act, 21 U.S.C. section 360bbb-3(b)(1), unless the authorization is terminated or revoked sooner. Performed at [facility], [place] [address] [place], [place] [address]   CSF culture     Status: None (Preliminary result)   Collection Time: 01/26/20  5:37 PM   Specimen: PATH Cytology CSF; Cerebrospinal Fluid  Result Value Ref Range Status   Specimen Description CSF  Final   Special Requests NONE  Final   Gram Stain   Final    WBC PRESENT, PREDOMINANTLY PMN NO ORGANISMS SEEN CYTOSPIN SMEAR    Culture   Final    NO GROWTH 2 DAYS Performed at [facility], [place] [address], [place], [place] [address]    Report Status PENDING  Incomplete  Culture, fungus without smear     Status: None (Preliminary result)   Collection Time: 01/26/20  5:37 PM   Specimen: CSF  Result Value Ref Range Status   Specimen Description CSF  Final   Special Requests NONE  Final   Culture   Final    NO FUNGUS ISOLATED AFTER 2 DAYS Performed at [facility], [address], [place], [place] [address]    Report Status PENDING  Incomplete    Radiology Reports CT Abdomen Pelvis Wo Contrast  Result Date: 01/23/2020 CLINICAL DATA:  Patient appears jaundiced history of lymphoma EXAM: CT ABDOMEN AND PELVIS WITHOUT CONTRAST TECHNIQUE: Multidetector CT imaging of the abdomen and pelvis was performed following the standard protocol without IV contrast. COMPARISON:  None. FINDINGS: Lower chest: The visualized heart size within normal limits. No pericardial fluid/thickening. No hiatal hernia. The visualized portions of the lungs are clear. Hepatobiliary: Although limited due to the lack of intravenous contrast, normal in appearance without gross focal abnormality. The patient is status post cholecystectomy. No biliary ductal dilation. Pancreas:  Unremarkable.  No  surrounding inflammatory changes. 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 Adrenals/Urinary Tract: Both adrenal glands appear normal. The kidneys and collecting system appear normal without evidence of urinary tract calculus or hydronephrosis. Bladder is unremarkable. Stomach/Bowel: The stomach, small bowel, and colon are normal in appearance. No inflammatory changes or obstructive findings. Scattered colonic diverticula are noted without diverticulitis. Appendix is normal. Vascular/Lymphatic: There are no enlarged abdominal or pelvic lymph nodes. Scattered aortic atherosclerotic calcifications are seen without aneurysmal dilatation. Reproductive: The uterus and adnexa are unremarkable. Other: No evidence of abdominal wall mass or hernia. Musculoskeletal: No acute or significant osseous findings. IMPRESSION: No acute intra-abdominal or pelvic pathology to explain the patient's symptoms. Diverticulosis without diverticulitis. Aortic Atherosclerosis (ICD10-I70.0). Electronically Signed   By: Prudencio Pair M.D.   On: 01/23/2020 22:03   CT Head Wo Contrast  Result Date: 01/23/2020 CLINICAL DATA:  Altered mental status. Left facial droop. EXAM: CT HEAD WITHOUT CONTRAST TECHNIQUE: Contiguous axial images were obtained from the base of the skull through the vertex without intravenous contrast. COMPARISON:  Brain MRI 11/24/2019 FINDINGS: Brain: No acute hemorrhage. No extra-axial or subdural collection. Mild generalized atrophy with moderate periventricular and deep white matter hypodensity consistent with chronic small vessel ischemia. This appears slightly asymmetric in the right cerebral hemisphere but no definite cortical infarct. Small calcification in left basal ganglia, likely senescent. No midline shift, hydrocephalus or suspicious mass effect. Vascular: Atherosclerosis of skullbase vasculature without hyperdense vessel or abnormal calcification. Skull: No  fracture or focal lesion. Sinuses/Orbits: Chronic paranasal sinus disease with near complete opacification of frontal sinuses, ethmoid air cells, left sphenoid and maxillary sinus. Mucosal thickening in the right sphenoid and maxillary sinus. The degree sinus opacification has slightly progressed from prior MRI. Mastoid air cells are clear. Other: None. IMPRESSION: 1. No hemorrhage or evidence of acute infarct. 2. Mild atrophy with moderate chronic small vessel ischemia. 3. Chronic paranasal sinus disease, slightly progressed from January MRI. Electronically Signed   By: Keith Rake M.D.   On: 01/23/2020 22:02   MR BRAIN WO CONTRAST  Result Date: 01/24/2020 CLINICAL DATA:  Initial evaluation for acute encephalopathy, altered mental status. History of leukemia, in remission. EXAM: MRI HEAD WITHOUT CONTRAST TECHNIQUE: Multiplanar, multiecho pulse sequences of the brain and surrounding structures were obtained without intravenous contrast. COMPARISON:  Prior CT from 01/23/2020 as well as previous MRI from 11/24/2019. FINDINGS: Brain: Examination somewhat technically limited by susceptibility artifact emanating from the scalp. Generalized age-related cerebral  atrophy. Patchy T2/FLAIR hyperintensity within the periventricular and deep white matter both cerebral hemispheres again seen, nonspecific, but most like related chronic microvascular ischemic disease, stable from previous. No abnormal foci of restricted diffusion to suggest acute or subacute ischemia. Gray-white matter differentiation maintained. No encephalomalacia to suggest chronic cortical infarction. Diffuse FLAIR hyperintensity seen throughout the cerebral sulci, suspected to be artifactual nature due to overlying susceptibility artifact. Possible proteinaceous material not entirely excluded. No associated susceptibility artifact to suggest subarachnoid hemorrhage. No mass lesion, midline shift, or mass effect. Ventricles normal size without  hydrocephalus. No appreciable intraventricular debris. No extra-axial fluid collection. No made of an empty sella. Midline structures intact. Vascular: Major intracranial vascular flow voids are maintained. Skull and upper cervical spine: Craniocervical junction within normal limits. Postsurgical changes partially visualize within the upper cervical spine. Bone marrow signal intensity heterogeneous and diffusely decreased on T1 weighted imaging, most like related history of leukemia. Susceptibility artifact involving the left greater than right scalp, of uncertain etiology. Sinuses/Orbits: Globes and orbital soft tissues demonstrate no acute finding. Extensive opacity and mucosal thickening seen throughout the paranasal sinuses, with superimposed right maxillary sinus air-fluid level. Findings could be infectious and/or inflammatory nature. Trace bilateral mastoid effusions. Visualized inner ear structures grossly normal. Other: None. IMPRESSION: 1. Technically limited exam due to susceptibility artifact emanating from the scalp, of uncertain etiology. 2. Diffuse FLAIR hyperintensity throughout the cerebral sulci and surrounding the brainstem and cerebellum. While this finding could be artifactual in nature related to overlying susceptibility artifact, proteinaceous material and changes related to meningitis could also have this appearance. Correlation with LP and CSF studies recommended for correlated purposes as clinically warranted. No susceptibility artifact to suggest subarachnoid hemorrhage. 3. 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 4. Extensive pan sinusitis. This could be either infectious or inflammatory in nature. Electronically Signed   By: Jeannine Boga M.D.   On: 01/24/2020 06:46   IR Fluoro Guide Ndl Plmt / BX  Result Date: 01/27/2020 CLINICAL DATA:  Confusion, altered mental status EXAM: DIAGNOSTIC LUMBAR  PUNCTURE UNDER FLUOROSCOPIC GUIDANCE FLUOROSCOPY TIME:  0.1 minute; 12  uGym2 DAP PROCEDURE: Informed consent was obtained from the family prior to the procedure, including potential complications of headache, allergy, and pain. With the patient prone, the lower back was prepped with Betadine. Intravenous Fentanyl 57mcg and Versed 1mg  were administered as conscious sedation during continuous monitoring of the patient's level of consciousness and physiological / cardiorespiratory status by the radiology RN, with a total moderate sedation time of 35 minutes. 1% Lidocaine was used for local anesthesia. Lumbar puncture was performed at the L3-4 level from a right parasagittal approach using a 20 gauge needle with return of yellow cloudy CSF with an opening pressure of 13 cm water. 9 ml of CSF were obtained for laboratory studies. The patient tolerated the procedure well and there were no apparent complications. IMPRESSION: 1. Technically successful lumbar puncture under fluoroscopy Electronically Signed   By: Lucrezia Europe M.D.   On: 01/27/2020 07:59   DG CHEST PORT 1 VIEW  Result Date: 01/27/2020 CLINICAL DATA:  Shortness of breath. History of leukemia. EXAM: PORTABLE CHEST 1 VIEW COMPARISON:  01/27/2020 FINDINGS: Midline trachea. Borderline cardiomegaly. Atherosclerosis in the transverse aorta. No pleural effusion or pneumothorax. Surgical clips in the left upper quadrant. No congestive failure. No lobar consolidation. IMPRESSION: Borderline cardiomegaly, without acute disease. Aortic Atherosclerosis (ICD10-I70.0). Electronically Signed   By: Adria Devon.D.  On: 01/27/2020 19:16   DG CHEST PORT 1 VIEW  Result Date: 01/27/2020 CLINICAL DATA:  SOB (shortness of breath) EXAM: PORTABLE CHEST - 1 VIEW COMPARISON:  the previous day's study FINDINGS: Relatively low lung volumes with resultant crowding of bronchovascular structures. The mild interstitial edema seen previously continues to improve. No new airspace  disease. Heart size within normal limits for technique. Aortic Atherosclerosis (ICD10-170.0). No effusion. No pneumothorax. Cervical fixation hardware partially visualized. Surgical clips in the upper abdomen. IMPRESSION: Improving interstitial edema. Electronically Signed   By: Lucrezia Europe M.D.   On: 01/27/2020 11:55   DG CHEST PORT 1 VIEW  Result Date: 01/26/2020 CLINICAL DATA:  Leukocytosis EXAM: PORTABLE CHEST 1 VIEW COMPARISON:  01/24/2020 FINDINGS: The cardiac silhouette, mediastinal and hilar contours are stable. Low lung volumes with vascular crowding and streaky atelectasis. Slight improved lung aeration since the prior study with less vascular congestion and resolving edema. No pleural effusions. IMPRESSION: 1. Low lung volumes with vascular crowding and streaky atelectasis. 2. Improved lung aeration. Electronically Signed   By: Marijo Sanes M.D.   On: 01/26/2020 07:34   DG CHEST PORT 1 VIEW  Result Date: 01/24/2020 CLINICAL DATA:  Shortness of breath and hypoxia. EXAM: PORTABLE CHEST 1 VIEW COMPARISON:  Radiograph yesterday. FINDINGS: Lower lung volumes from prior exam. Prominent heart size likely accentuated by technique. Bronchovascular crowding versus vascular congestion. No significant pleural effusion. No pneumothorax. No confluent airspace disease. No acute osseous abnormalities are seen. IMPRESSION: Lower lung volumes from prior exam. Bronchovascular crowding versus vascular congestion. Prominent heart size likely accentuated by technique and low lung volumes. Electronically Signed   By: Keith Rake M.D.   On: 01/24/2020 19:19   DG Chest Port 1 View  Result Date: 01/23/2020 CLINICAL DATA:  69 year old female with sepsis. EXAM: PORTABLE CHEST 1 VIEW COMPARISON:  Chest radiographs 12/19/2018 and earlier. FINDINGS: Portable AP semi upright view at 2045 hours. Larger lung volumes. Cardiac size at the upper limits of normal. Calcified aortic atherosclerosis. Other mediastinal contours  are within normal limits. Visualized tracheal air column is within normal limits. Allowing for portable technique the lungs are clear. Chronic left upper quadrant surgical clips and cervical ACDF. Negative visible bowel gas pattern. No acute osseous abnormality identified. IMPRESSION: No acute cardiopulmonary abnormality. Aortic Atherosclerosis (ICD10-I70.0). Electronically Signed   By: Genevie Ann M.D.   On: 01/23/2020 20:55   EEG adult  Result Date: 01/24/2020 Lora Havens, MD     01/24/2020 10:59 AM Patient Name: TOIYA NACHTMAN MRN: [id number] Epilepsy Attending: Lora Havens Referring Physician/Provider: Dr Amie [place] Date: 01/24/2020 Duration: 24.09 mins Patient history: 69 y.o.femalewith past medical history significant for leukemia, non-Hodgkin's lymphoma 2002 presented with altered mental status, fever, significantly elevated leukocytosis, with a rightward gaze preference. EEG to evaluate for status epilepticus. Level of alertness: lethargic AEDs during EEG study: Ativan, Keppra Technical aspects: This EEG study was done with scalp electrodes positioned according to the 10-20 International system of electrode placement. Electrical activity was acquired at a sampling rate of 500Hz  and reviewed with a high frequency filter of 70Hz  and a low frequency filter of 1Hz . EEG data were recorded continuously and digitally stored. DESCRIPTION: EEG showed continuous 3-4Hz  delta slowing in right hemisphere. There is also continuous 5-8Hz  theta-alpha activity in left hemisphere with intermittent 2-3hz  delta activity..  Hyperventilation and photic stimulation were not performed. ABNORMALITY - Continuous slow, generalized and lateralized right hemisphere IMPRESSION: This study is suggestive of cortical dysfunction in right hemisphere likely  secondary to post-ictal state, underlying structural abnormality.  No seizures or definite epileptiform discharges were seen throughout the recording. Dr Rory Percy was  notified. Priyanka Barbra Sarks   Overnight EEG with video  Result Date: 01/24/2020 Lora Havens, MD     01/25/2020 10:07 AM Patient Name: VENNESA PATTY MRN: [id number] Epilepsy Attending: Lora Havens Referring Physician/Provider: Dr Amie Portland Duration: 01/24/2020 [id number] to 01/25/2020 [id number]  Patient history: 69 y.o.femalewith past medical history significant for leukemia, non-Hodgkin's lymphoma 2002 presented with altered mental status, fever, significantly elevated leukocytosis, with a rightward gaze preference. EEG to evaluate for status epilepticus.  Level of alertness: lethargic  AEDs during EEG study: Ativan, Keppra  Technical aspects: This EEG study was done with scalp electrodes positioned according to the 10-20 International system of electrode placement. Electrical activity was acquired at a sampling rate of 500Hz  and reviewed with a high frequency filter of 70Hz  and a low frequency filter of 1Hz . EEG data were recorded continuously and digitally stored.  DESCRIPTION: EEG showed continuous 5-8Hz  theta-alpha activity in left hemisphere with intermittent 2-3hz  delta activity which at times appears quasi rhythmic. Sharp waves were also noted in left frontal region. Continuous 3-4Hz  low amplitude delta slowing was also noted in right hemisphere. Hyperventilation and photic stimulation were not performed.  ABNORMALITY - Sharp waves, left frontotemporal region - Intermittent rhythmic delta activity, left frontal region - Continuous slow, generalized and lateralized right hemisphere  IMPRESSION: This study is suggestive of epileptogenicity and cortical dysfunction in left frontotemporal region. Quasi rhythmic delta activity with sharp waves in left frontotemporal region can be on the ictal-interictal continuum. Additionally, there is evidence of cortical dysfunction in right hemisphere likely secondary to post-ictal state, underlying structural abnormality.  No definite seizures were seen  throughout the recording. Priyanka Barbra Sarks     Time Spent in minutes  30     Desiree Hane M.D on 01/28/2020 at 4:07 PM  To page go to [email or web address] - password [facility]

## 2020-01-28 NOTE — Progress Notes (Signed)
  Speech Language Pathology Treatment: Dysphagia  Patient Details Name: Kathleen Duran MRN: [id number] DOB: 05/06/1951 Today's Date: 01/28/2020 Time: [id number] SLP Time Calculation (min) (ACUTE ONLY): 13 min  Assessment / Plan / Recommendation Clinical Impression  Pt was seen for dysphagia treatment to assess improvement in swallow function. Pt's husband was present at the onset of the session and he reported that he has been giving her ice chips frequently because she continues to request water to drink. Pt tolerated puree solids, regular texture solids, and individual sips of thin liquids via cup and straw without overt s/sx of aspiration. However, she exhibited coughing with consecutive swallows and some elevation in RR was noted. Mastication time was mild-moderately increased with regular texture solids and mild oral residue was noted. Residue was cleared with liquid washes. It is recommended that a dysphagia 2 diet with thin liquids be initiated at this time. Swallowing precautions should be observed strictly to reduce aspiration risk. Pt demonstrated some impulsive behaviors during the session and required verbal and tactile cues for rate with thin liquids. Full supervision will be needed to ensure pt's observance of swallowing precautions. SLP will follow to ensure diet tolerance and to assess need for instrumental evaluation.    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 Started on empiric antibiotics for possible meningitis and admitted for further work-up.      SLP Plan  Goals updated       Recommendations  Diet recommendations: Dysphagia 2 (fine chop);Thin liquid Liquids provided via: Cup;No straw Medication Administration:  Crushed with puree Supervision: Staff to assist with self feeding;Full supervision/cueing for compensatory strategies Compensations: Slow rate;Small sips/bites;Follow solids with liquid Postural Changes and/or Swallow Maneuvers: Seated upright 90 degrees                Oral Care Recommendations: Oral care BID Follow up Recommendations: Other (comment)(TBA) SLP Visit Diagnosis: Dysphagia, unspecified (R13.10) Plan: Goals updated       Catie Chiao I. Hardin Negus, [place], [place] Office number [phone] Pager [place] 01/28/2020, 12:36 PM

## 2020-01-29 ENCOUNTER — Inpatient Hospital Stay (HOSPITAL_COMMUNITY): Payer: Medicare HMO

## 2020-01-29 LAB — BASIC METABOLIC PANEL
Anion gap: 15 (ref 5–15)
BUN: 77 mg/dL — ABNORMAL HIGH (ref 8–23)
CO2: 17 mmol/L — ABNORMAL LOW (ref 22–32)
Calcium: 8.5 mg/dL — ABNORMAL LOW (ref 8.9–10.3)
Chloride: 114 mmol/L — ABNORMAL HIGH (ref 98–111)
Creatinine, Ser: 3.91 mg/dL — ABNORMAL HIGH (ref 0.44–1.00)
GFR calc Af Amer: 13 mL/min — ABNORMAL LOW (ref >60)
GFR calc non Af Amer: 11 mL/min — ABNORMAL LOW (ref >60)
Glucose, Bld: 124 mg/dL — ABNORMAL HIGH (ref 70–99)
Potassium: 4.1 mmol/L (ref 3.5–5.1)
Sodium: 146 mmol/L — ABNORMAL HIGH (ref 135–145)

## 2020-01-29 LAB — GLUCOSE, CAPILLARY
Glucose-Capillary: 104 mg/dL — ABNORMAL HIGH (ref 70–99)
Glucose-Capillary: 107 mg/dL — ABNORMAL HIGH (ref 70–99)
Glucose-Capillary: 91 mg/dL (ref 70–99)
Glucose-Capillary: 95 mg/dL (ref 70–99)

## 2020-01-29 LAB — URINALYSIS, COMPLETE (UACMP) WITH MICROSCOPIC
Bilirubin Urine: NEGATIVE
Glucose, UA: 50 mg/dL — AB
Ketones, ur: NEGATIVE mg/dL
Leukocytes,Ua: NEGATIVE
Nitrite: NEGATIVE
Protein, ur: NEGATIVE mg/dL
Specific Gravity, Urine: 1.012 (ref 1.005–1.030)
pH: 6 (ref 5.0–8.0)

## 2020-01-29 LAB — CBC
HCT: 37.9 % (ref 36.0–46.0)
Hemoglobin: 12.4 g/dL (ref 12.0–15.0)
MCH: 26.7 pg (ref 26.0–34.0)
MCHC: 32.7 g/dL (ref 30.0–36.0)
MCV: 81.5 fL (ref 80.0–100.0)
Platelets: 199 10*3/uL (ref 150–400)
RBC: 4.65 MIL/uL (ref 3.87–5.11)
RDW: 16.7 % — ABNORMAL HIGH (ref 11.5–15.5)
WBC: 31.1 10*3/uL — ABNORMAL HIGH (ref 4.0–10.5)
nRBC: 0 % (ref 0.0–0.2)

## 2020-01-29 LAB — CD19 AND CD20, FLOW CYTOMETRY

## 2020-01-29 MED ORDER — SODIUM CHLORIDE 0.9 % IV SOLN
1.0000 g | Freq: Two times a day (BID) | INTRAVENOUS | Status: DC
  Administered 2020-01-29 – 2020-01-30 (×2): 1 g via INTRAVENOUS
  Filled 2020-01-29 (×3): qty 1

## 2020-01-29 MED ORDER — ADULT MULTIVITAMIN W/MINERALS CH
1.0000 | ORAL_TABLET | Freq: Every day | ORAL | Status: DC
  Administered 2020-01-29 – 2020-02-05 (×8): 1 via ORAL
  Filled 2020-01-29 (×8): qty 1

## 2020-01-29 MED ORDER — CHLORHEXIDINE GLUCONATE CLOTH 2 % EX PADS
6.0000 | MEDICATED_PAD | Freq: Every day | CUTANEOUS | Status: DC
  Administered 2020-01-29 – 2020-02-05 (×8): 6 via TOPICAL

## 2020-01-29 MED ORDER — ENOXAPARIN SODIUM 30 MG/0.3ML ~~LOC~~ SOLN
30.0000 mg | SUBCUTANEOUS | Status: DC
  Administered 2020-01-29: 30 mg via SUBCUTANEOUS
  Filled 2020-01-29: qty 0.3

## 2020-01-29 MED ORDER — ENSURE ENLIVE PO LIQD
237.0000 mL | Freq: Three times a day (TID) | ORAL | Status: DC
  Administered 2020-01-29 – 2020-02-05 (×19): 237 mL via ORAL

## 2020-01-29 MED ORDER — LEVETIRACETAM IN NACL 500 MG/100ML IV SOLN
500.0000 mg | Freq: Two times a day (BID) | INTRAVENOUS | Status: DC
  Administered 2020-01-29 – 2020-01-30 (×2): 500 mg via INTRAVENOUS
  Filled 2020-01-29 (×3): qty 100

## 2020-01-29 NOTE — Progress Notes (Signed)
PHARMACY NOTE:  ANTIMICROBIAL RENAL DOSAGE ADJUSTMENT  Current antimicrobial regimen includes a mismatch between antimicrobial dosage and estimated renal function.  As per policy approved by the Pharmacy & Therapeutics and Medical Executive Committees, the antimicrobial dosage will be adjusted accordingly.  Current antimicrobial dosage:  Meropenem 2g IV q12h  Indication: Streptococcus pneumoniae bacteremia and meningitis   Renal Function:  Estimated Creatinine Clearance: 13.5 mL/min (A) (by C-G formula based on SCr of 3.91 mg/dL (H)). []      On intermittent HD, scheduled: []      On CRRT    Antimicrobial dosage has been changed to:  Meropenem 1g IV q12h  Additional comments:   Thank you for allowing pharmacy to be a part of this patient's care.  Phillis Haggis, [facility] 01/29/2020 10:21 AM

## 2020-01-29 NOTE — Progress Notes (Signed)
[facility]  PROGRESS NOTE  Emmilou Ruhe Pugh [id number] DOB: [birth year] DOA: 01/23/2020 PCP: Arlyss Repress, MD Admit date - 01/23/2020   Admitting Physician Kerney Elbe, DO  Outpatient Primary MD for the patient is Arlyss Repress, MD  LOS - 6 Brief Narrative   HALAINA SALER is a 69 y.o. year old female with medical history significant forpromyelocytic leukemia under remission, hypertension, GERD  who presented on 01/23/2020 with fever, sudden confusion x 24 hours and vomiting and was found to have nuchal rigidity, leukocytosis of 70,000, and creatinine of 3.09 (normal in October) and admitted with working diagnosis of sepsis secondary to presumed meningitis.  Underwent unsuccessful LP in ED and started on empiric antibiotics for meningitis.  Hospital course complicated by altered mental status concern for meningitis based on MRI brain imaging.  Patient underwent LP on 3/29 which showed markedly elevated WBC of greater than 30,000 predominant neutrophilic, elevated protein of 292, and depressed glucose consistent with bacterial meningitis.HSV1/2 negative  Subjective  Still having intermittent episodes of confusion.  No acute events overnight.  Returned over 1.8 L on bladder scan per nursing report  A & P   Altered mental status secondary to meningitis, likely strep pneumo, stable Has intermittent episodes of confusion, but currently alert and oriented x3 significantly improved from last few days of hospital stay.  Remains afebrile, based off of LP likely bacterial, given strep pneumo in blood this is likely etiology -Currently on meropenem, DC'd vancomycin, ampicillin, and acyclovir on 3/30 -Monitor CSF cultures currently pending with no growth -Patient IVC'd 3/31, does not have competency to leave AGAINST MEDICAL ADVICE  Sepsis secondary to Strep pneumo bacteremia with complicated meningitis/encephalitis and AKI,  Likely source is sinusitis given degree of  opacification on CT head imaging, no findings on CXR. Likely culprit of meningitis.  Leukocytosis up to 31 from 24.4, remains afebrile, still has tachycardia, mental status improving -Daily CBC -Antibiotics as mentioned above -Repeat blood cultures -Obtain TTE  Elevated LFTs, acute liver injury in the setting of sepsis, resolved Peak ALT of 240, and AST 161.  Now back to normal baseline.  Discrete skin rash on left leg, that extends to upper abdomen Husband reports this is a birthmark, however not at bedside to evaluate with me.  Doubt reaction to antibiotic given only on one side -We will assess with husband to confirm this is stable -Closely monitor  LTM concerning for epileptogenicity No definite seizures seen on LTM EEG recording -Roger recommends continuing Keppra 750 mg twice daily  Hypernatremia, in the setting of diminished p.o. intake, improving -Currently on D5 half-normal/expect improvement now that patient able to tolerate diet (dysphagia 2 diet) -Repeat BMP in a.m.  AKI, definitely worsened.  Initially suspect related to contrast/diminished p.o. intake on admission with peak of 2.17, now up to 3.91 with previous of 1.95 likely due to potential nephrotoxicity from recent vancomycin as well as retention of urine. -Continue IV fluids until ensure adequate oral intake -Avoid nephrotoxins (vancomycin has been DC'd) -Continue scheduled bladder scans to ensure no retention -Obtain renal ultrasound, check UA, if creatinine continues to worsen will consult nephrology, monitor output  -Repeat BMP in am -Holding home irbesartan  HTN, slightly elevated with SBP's in the 150s likely in setting of holding home BP meds. -Resume home Lopressor at 25 mg qd (reduced from home dose) given concomitant tachycardia and closely monitor -Hold home irbesartan given AKI  GERD, stable -Continue home PPI  History of B-cell prolymphocytic leukemia, status post colectomy  and  chemotherapy Evaluated by oncology this admission, per their review peripheral smear seems more reactive, doubt relapse, likely all related to meningitis above.  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 Anticipated d/c date:  3-4 days. Barriers to d/c or necessity for inpatient status:  Continue IV meropenem for bacterial meningitis, close monitoring mental status intermittent episodes of confusion, needs stabilization of kidney function, electrolyte abnormalities Consults  : Neurology, oncology  Procedures  : 3/29, LP under fluoroscopy  DVT Prophylaxis  :  Lovenox  Lab Results  Component Value Date   PLT 199 01/29/2020    Diet :  Diet Order            DIET DYS 2 Room service appropriate? Yes with Assist; Fluid consistency: Thin  Diet effective now               Inpatient Medications Scheduled Meds: . chlorhexidine  15 mL Mouth Rinse BID  . enoxaparin (LOVENOX) injection  40 mg Subcutaneous Q24H  . mouth rinse  15 mL Mouth Rinse q12n4p  . metoprolol tartrate  25 mg Oral Daily  . pantoprazole  40 mg Oral Daily  . sodium chloride flush  10-40 mL Intracatheter Q12H   Continuous Infusions: . dextrose 5 % and 0.45% NaCl 75 mL/hr at 01/29/20 0500  . levETIRAcetam 750 mg (01/29/20 0851)  . meropenem (MERREM) IV     PRN Meds:.acetaminophen, haloperidol lactate, labetalol, levalbuterol, LORazepam, ondansetron **OR** ondansetron (ZOFRAN) IV, sodium chloride flush  Antibiotics  :   Anti-infectives (From admission, onward)   Start     Dose/Rate Route Frequency Ordered Stop   01/29/20 1400  meropenem (MERREM) 1 g in sodium chloride 0.9 % 100 mL IVPB     1 g 200 mL/hr over 30 Minutes Intravenous Every 12 hours 01/29/20 1020     01/28/20 1400  meropenem (MERREM) 2 g in sodium chloride 0.9 % 100 mL IVPB   Status:  Discontinued     2 g 200 mL/hr over 30 Minutes Intravenous Every 12 hours 01/28/20 1039 01/29/20 1020   01/25/20 1200  acyclovir (ZOVIRAX) 610 mg in dextrose 5 % 100 mL IVPB  Status:  Discontinued     610 mg 112.2 mL/hr over 60 Minutes Intravenous Every 8 hours 01/25/20 0924 01/25/20 0933   01/25/20 1000  meropenem (MERREM) 2 g in sodium chloride 0.9 % 100 mL IVPB  Status:  Discontinued     2 g 200 mL/hr over 30 Minutes Intravenous Every 8 hours 01/25/20 0919 01/28/20 1039   01/25/20 1000  vancomycin (VANCOREADY) IVPB 750 mg/150 mL  Status:  Discontinued     750 mg 150 mL/hr over 60 Minutes Intravenous Every 12 hours 01/25/20 0919 01/27/20 1547   01/24/20 0230  acyclovir (ZOVIRAX) 680 mg in dextrose 5 % 100 mL IVPB  Status:  Discontinued     680 mg 113.6 mL/hr over 60 Minutes Intravenous Every 24 hours 01/24/20 0137 01/25/20 0924   01/23/20 2200  meropenem (MERREM) 1 g in sodium chloride 0.9 % 100 mL IVPB  Status:  Discontinued     1 g 200 mL/hr over 30 Minutes Intravenous Every 8 hours 01/23/20 2042 01/23/20 2044   01/23/20 2200  meropenem (MERREM) 2 g in sodium chloride 0.9 % 100 mL IVPB  Status:  Discontinued  2 g 200 mL/hr over 30 Minutes Intravenous Every 8 hours 01/23/20 2044 01/23/20 2052   01/23/20 2100  meropenem (MERREM) 1 g in sodium chloride 0.9 % 100 mL IVPB  Status:  Discontinued     1 g 200 mL/hr over 30 Minutes Intravenous Every 12 hours 01/23/20 2052 01/25/20 0919   01/23/20 2055  vancomycin variable dose per unstable renal function (pharmacist dosing)  Status:  Discontinued      Does not apply See admin instructions 01/23/20 2056 01/25/20 0934   01/23/20 2045  vancomycin (VANCOREADY) IVPB 1750 mg/350 mL     1,750 mg 175 mL/hr over 120 Minutes Intravenous  Once 01/23/20 2042 01/24/20 0010       Objective   Vitals:   01/29/20 0737 01/29/20 1007 01/29/20 1009 01/29/20 1148  BP: 136/71 101/87 (!) 110/56 (!) 127/58  Pulse:    (!) 106  Resp: 20   18   Temp: 98.3 F (36.8 C)   99 F (37.2 C)  TempSrc: Oral   Axillary  SpO2: 100%   100%  Weight:      Height:        SpO2: 100 % O2 Flow Rate (L/min): 2 L/min  Wt Readings from Last 3 Encounters:  01/28/20 80 kg  04/22/18 92.6 kg  04/17/16 95.4 kg     Intake/Output Summary (Last 24 hours) at 01/29/2020 1332 Last data filed at 01/29/2020 1205 Gross per 24 hour  Intake 2097.86 ml  Output 1850 ml  Net 247.86 ml    Physical Exam:     Awake Alert, Oriented X to self, not to place, time (year, month, season), context Follow simple commands [place].AT, Normal respiratory effort on , CTAB RRR,No Gallops,Rubs or new Murmurs,  +ve B.Sounds, Abd Soft, No tenderness, No rebound, guarding or rigidity. Left Leg         I have personally reviewed the following:   Data Reviewed:  CBC Recent Labs  Lab 01/25/20 0839 01/25/20 0839 01/25/20 1804 01/26/20 0901 01/27/20 0700 01/28/20 0421 01/29/20 0759  WBC 62.1*   < > 51.3* 33.6* 25.3* 24.4* 31.1*  HGB 12.4   < > 12.6 11.6* 13.3 13.4 12.4  HCT 39.1   < > 38.0 34.0* 39.9 40.4 37.9  PLT 134*   < > 146* 179 111* 187 199  MCV 85.4   < > 81.5 79.3* 80.0 81.1 81.5  MCH 27.1   < > 27.0 27.0 26.7 26.9 26.7  MCHC 31.7   < > 33.2 34.1 33.3 33.2 32.7  RDW 17.8*   < > 17.3* 16.7* 17.7* 17.1* 16.7*  LYMPHSABS 1.2  --  1.2 6.4* 3.3 1.5  --   MONOABS 7.8*  --  6.0* 2.4* 6.6* 6.7*  --   EOSABS 0.0  --  0.0 1.7* 0.0 0.2  --   BASOSABS 0.0  --  0.2* 0.3* 0.0 0.1  --    < > = values in this interval not displayed.    Chemistries  Recent Labs  Lab 01/24/20 1819 01/25/20 0324 01/25/20 0324 01/25/20 0839 01/25/20 1804 01/26/20 1223 01/26/20 1422 01/27/20 1211 01/28/20 0421 01/29/20 0759  NA  --  147*   < >  --  148* 148*  --  150* 152* 146*  K  --  3.7   < >  --  3.9 4.4  --  4.5 3.6 4.1  CL  --  119*   < >  --  117* 118*  --  119* 118*  114*  CO2  --  17*   < >  --  19* 19*  --  19* 20* 17*  GLUCOSE  --  140*   < >  --  156*  135*  --  140* 136* 124*  BUN  --  31*   < >  --  26* 23  --  38* 48* 77*  CREATININE  --  0.93   < >  --  0.73 0.75  --  1.23* 1.95* 3.91*  CALCIUM  --  8.2*   < >  --  8.4* 8.5*  --  8.1* 8.7* 8.5*  MG   < >  --   --  2.4 2.2  --  2.1 2.3 2.3  --   AST  --  39  --   --  22 31  --  32 19  --   ALT  --  113*  --   --  86* 70*  --  50* 42  --   ALKPHOS  --  150*  --   --  123 117  --  102 96  --   BILITOT  --  1.1   < >  --  1.3* QUANTITY NOT SUFFICIENT, UNABLE TO PERFORM TEST 1.4* 2.0* 1.0  --    < > = values in this interval not displayed.   ------------------------------------------------------------------------------------------------------------------ No results for input(s): CHOL, HDL, LDLCALC, TRIG, CHOLHDL, LDLDIRECT in the last 72 hours.  No results found for: HGBA1C ------------------------------------------------------------------------------------------------------------------ No results for input(s): TSH, T4TOTAL, T3FREE, THYROIDAB in the last 72 hours.  Invalid input(s): FREET3 ------------------------------------------------------------------------------------------------------------------ No results for input(s): VITAMINB12, FOLATE, FERRITIN, TIBC, IRON, RETICCTPCT in the last 72 hours.  Coagulation profile Recent Labs  Lab 01/23/20 2030  INR 1.3*    No results for input(s): DDIMER in the last 72 hours.  Cardiac Enzymes No results for input(s): CKMB, TROPONINI, MYOGLOBIN in the last 168 hours.  Invalid input(s): CK ------------------------------------------------------------------------------------------------------------------ No results found for: BNP  Micro Results Recent Results (from the past 240 hour(s))  Blood Culture (routine x 2)     Status: Abnormal   Collection Time: 01/23/20  8:29 PM   Specimen: BLOOD LEFT HAND  Result Value Ref Range Status   Specimen Description BLOOD LEFT HAND  Final   Special Requests   Final    BOTTLES DRAWN AEROBIC AND  ANAEROBIC Blood Culture adequate volume   Culture  Setup Time   Final    GRAM POSITIVE COCCI IN PAIRS IN BOTH AEROBIC AND ANAEROBIC BOTTLES CRITICAL RESULT CALLED TO, READ BACK BY AND VERIFIED WITH: T. BAUMEISTER, PHARMD AT 1120 ON 01/24/20 BY C. JESSUP, MT.    Culture (A)  Final    STREPTOCOCCUS PNEUMONIAE SUSCEPTIBILITIES PERFORMED ON PREVIOUS CULTURE WITHIN THE LAST 5 DAYS. Performed at [facility], [place] [address], [place], [place] [address]    Report Status 01/26/2020 FINAL  Final  Blood Culture (routine x 2)     Status: Abnormal   Collection Time: 01/23/20  8:55 PM   Specimen: BLOOD RIGHT HAND  Result Value Ref Range Status   Specimen Description BLOOD RIGHT HAND  Final   Special Requests   Final    BOTTLES DRAWN AEROBIC AND ANAEROBIC Blood Culture results may not be optimal due to an inadequate volume of blood received in culture bottles   Culture  Setup Time   Final    GRAM POSITIVE COCCI IN PAIRS IN BOTH AEROBIC AND ANAEROBIC BOTTLES CRITICAL RESULT  CALLED TO, READ BACK BY AND VERIFIED WITH: T. BAUMEISTER, PHARMD AT 1120 ON 01/24/20 BY C. JESSUP, MT. Performed at [facility], [place] [address] [place], [place] [address]    Culture STREPTOCOCCUS PNEUMONIAE (A)  Final   Report Status 01/26/2020 FINAL  Final   Organism ID, Bacteria STREPTOCOCCUS PNEUMONIAE  Final      Susceptibility   Streptococcus pneumoniae - MIC*    ERYTHROMYCIN <=0.12 SENSITIVE Sensitive     LEVOFLOXACIN 0.5 SENSITIVE Sensitive     VANCOMYCIN 0.25 SENSITIVE Sensitive     PENICILLIN (meningitis) 0.12 RESISTANT Resistant     PENO - penicillin 0.12      PENICILLIN (non-meningitis) 0.12 SENSITIVE Sensitive     PENICILLIN (oral) 0.12 INTERMEDIATE Intermediate     CEFTRIAXONE (non-meningitis) <=0.12 SENSITIVE Sensitive     CEFTRIAXONE (meningitis) <=0.12 SENSITIVE Sensitive     * STREPTOCOCCUS PNEUMONIAE  Blood Culture ID Panel (Reflexed)     Status: Abnormal   Collection Time: 01/23/20  8:55 PM   Result Value Ref Range Status   Enterococcus species NOT DETECTED NOT DETECTED Final   Listeria monocytogenes NOT DETECTED NOT DETECTED Final   Staphylococcus species NOT DETECTED NOT DETECTED Final   Staphylococcus aureus (BCID) NOT DETECTED NOT DETECTED Final   Streptococcus species DETECTED (A) NOT DETECTED Final    Comment: CRITICAL RESULT CALLED TO, READ BACK BY AND VERIFIED WITH: T. BAUMEISTER, PHARMD AT 1120 ON 01/24/20 BY C. JESSUP, MT.    Streptococcus agalactiae NOT DETECTED NOT DETECTED Final   Streptococcus pneumoniae DETECTED (A) NOT DETECTED Final    Comment: CRITICAL RESULT CALLED TO, READ BACK BY AND VERIFIED WITH: T. BAUMEISTER, PHARMD AT 1120 ON 01/24/20 BY C. JESSUP, MT.    Streptococcus pyogenes NOT DETECTED NOT DETECTED Final   Acinetobacter baumannii NOT DETECTED NOT DETECTED Final   Enterobacteriaceae species NOT DETECTED NOT DETECTED Final   Enterobacter cloacae complex NOT DETECTED NOT DETECTED Final   Escherichia coli NOT DETECTED NOT DETECTED Final   Klebsiella oxytoca NOT DETECTED NOT DETECTED Final   Klebsiella pneumoniae NOT DETECTED NOT DETECTED Final   Proteus species NOT DETECTED NOT DETECTED Final   Serratia marcescens NOT DETECTED NOT DETECTED Final   Haemophilus influenzae NOT DETECTED NOT DETECTED Final   Neisseria meningitidis NOT DETECTED NOT DETECTED Final   Pseudomonas aeruginosa NOT DETECTED NOT DETECTED Final   Candida albicans NOT DETECTED NOT DETECTED Final   Candida glabrata NOT DETECTED NOT DETECTED Final   Candida krusei NOT DETECTED NOT DETECTED Final   Candida parapsilosis NOT DETECTED NOT DETECTED Final   Candida tropicalis NOT DETECTED NOT DETECTED Final    Comment: Performed at [facility], [place]. [address], [place], [place] [address]  Urine culture     Status: None   Collection Time: 01/23/20  9:17 PM   Specimen: In/Out Cath Urine  Result Value Ref Range Status   Specimen Description IN/OUT CATH URINE  Final   Special  Requests NONE  Final   Culture   Final    NO GROWTH Performed at [facility], [place] [address] [place], [place] [address]    Report Status 01/25/2020 FINAL  Final  SARS CORONAVIRUS 2 (TAT 6-24 HRS) Nasopharyngeal Nasopharyngeal Swab     Status: None   Collection Time: 01/23/20 11:06 PM   Specimen: Nasopharyngeal Swab  Result Value Ref Range Status   SARS Coronavirus 2 NEGATIVE NEGATIVE Final    Comment: (NOTE) SARS-CoV-2 target nucleic acids are NOT  DETECTED. The SARS-CoV-2 RNA is generally detectable in upper and lower respiratory specimens during the acute phase of infection. Negative results do not preclude SARS-CoV-2 infection, do not rule out co-infections with other pathogens, and should not be used as the sole basis for treatment or other patient management decisions. Negative results must be combined with clinical observations, patient history, and epidemiological information. The expected result is Negative. Fact Sheet for Patients: SugarRoll.be Fact Sheet for Healthcare Providers: https://www.woods-mathews.com/ This test is not yet approved or cleared by the [place] FDA and  has been authorized for detection and/or diagnosis of SARS-CoV-2 by FDA under an Emergency Use Authorization (EUA). This EUA will remain  in effect (meaning this test can be used) for the duration of the COVID-19 declaration under Section 56 4(b)(1) of the Act, 21 U.S.C. section 360bbb-3(b)(1), unless the authorization is terminated or revoked sooner. Performed at [facility], [place] [address], [place], [place] [address]   CSF culture     Status: None (Preliminary result)   Collection Time: 01/26/20  5:37 PM   Specimen: PATH Cytology CSF; Cerebrospinal Fluid  Result Value Ref Range Status   Specimen Description CSF  Final   Special Requests NONE  Final   Gram Stain   Final    WBC PRESENT, PREDOMINANTLY PMN NO ORGANISMS SEEN CYTOSPIN  SMEAR    Culture   Final    NO GROWTH 3 DAYS Performed at [place] [address] [place], [place] [address]    Report Status PENDING  Incomplete  Culture, fungus without smear     Status: None (Preliminary result)   Collection Time: 01/26/20  5:37 PM   Specimen: CSF  Result Value Ref Range Status   Specimen Description CSF  Final   Special Requests NONE  Final   Culture   Final    NO FUNGUS ISOLATED AFTER 3 DAYS Performed at [facility], [place] [address], [place], [place] [address]    Report Status PENDING  Incomplete    Radiology Reports CT Abdomen Pelvis Wo Contrast  Result Date: 01/23/2020 CLINICAL DATA:  Patient appears jaundiced history of lymphoma EXAM: CT ABDOMEN AND PELVIS WITHOUT CONTRAST TECHNIQUE: Multidetector CT imaging of the abdomen and pelvis was performed following the standard protocol without IV contrast. COMPARISON:  None. FINDINGS: Lower chest: The visualized heart size within normal limits. No pericardial fluid/thickening. No hiatal hernia. The visualized portions of the lungs are clear. Hepatobiliary: Although limited due to the lack of intravenous contrast, normal in appearance without gross focal abnormality. The patient is status post cholecystectomy. No biliary ductal dilation. Pancreas:  Unremarkable.  No surrounding inflammatory changes. 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 Adrenals/Urinary Tract: Both adrenal glands appear normal. The kidneys and collecting system appear normal without evidence of urinary tract calculus or hydronephrosis. Bladder is unremarkable. Stomach/Bowel: The stomach, small bowel, and colon are normal in appearance. No inflammatory changes or obstructive findings. Scattered colonic diverticula are noted without diverticulitis. Appendix is normal. Vascular/Lymphatic: There are no enlarged abdominal or pelvic lymph nodes. Scattered aortic atherosclerotic calcifications  are seen without aneurysmal dilatation. Reproductive: The uterus and adnexa are unremarkable. Other: No evidence of abdominal wall mass or hernia. Musculoskeletal: No acute or significant osseous findings. IMPRESSION: No acute intra-abdominal or pelvic pathology to explain the patient's symptoms. Diverticulosis without diverticulitis. Aortic Atherosclerosis (ICD10-I70.0). Electronically Signed   By: Prudencio Pair M.D.   On: 01/23/2020 22:03  CT Head Wo Contrast  Result Date: 01/23/2020 CLINICAL DATA:  Altered mental status. Left facial droop. EXAM: CT HEAD WITHOUT CONTRAST TECHNIQUE: Contiguous axial images were obtained from the base of the skull through the vertex without intravenous contrast. COMPARISON:  Brain MRI 11/24/2019 FINDINGS: Brain: No acute hemorrhage. No extra-axial or subdural collection. Mild generalized atrophy with moderate periventricular and deep white matter hypodensity consistent with chronic small vessel ischemia. This appears slightly asymmetric in the right cerebral hemisphere but no definite cortical infarct. Small calcification in left basal ganglia, likely senescent. No midline shift, hydrocephalus or suspicious mass effect. Vascular: Atherosclerosis of skullbase vasculature without hyperdense vessel or abnormal calcification. Skull: No fracture or focal lesion. Sinuses/Orbits: Chronic paranasal sinus disease with near complete opacification of frontal sinuses, ethmoid air cells, left sphenoid and maxillary sinus. Mucosal thickening in the right sphenoid and maxillary sinus. The degree sinus opacification has slightly progressed from prior MRI. Mastoid air cells are clear. Other: None. IMPRESSION: 1. No hemorrhage or evidence of acute infarct. 2. Mild atrophy with moderate chronic small vessel ischemia. 3. Chronic paranasal sinus disease, slightly progressed from January MRI. Electronically Signed   By: Keith Rake M.D.   On: 01/23/2020 22:02   MR BRAIN WO CONTRAST  Result  Date: 01/24/2020 CLINICAL DATA:  Initial evaluation for acute encephalopathy, altered mental status. History of leukemia, in remission. EXAM: MRI HEAD WITHOUT CONTRAST TECHNIQUE: Multiplanar, multiecho pulse sequences of the brain and surrounding structures were obtained without intravenous contrast. COMPARISON:  Prior CT from 01/23/2020 as well as previous MRI from 11/24/2019. FINDINGS: Brain: Examination somewhat technically limited by susceptibility artifact emanating from the scalp. Generalized age-related cerebral atrophy. Patchy T2/FLAIR hyperintensity within the periventricular and deep white matter both cerebral hemispheres again seen, nonspecific, but most like related chronic microvascular ischemic disease, stable from previous. No abnormal foci of restricted diffusion to suggest acute or subacute ischemia. Gray-white matter differentiation maintained. No encephalomalacia to suggest chronic cortical infarction. Diffuse FLAIR hyperintensity seen throughout the cerebral sulci, suspected to be artifactual nature due to overlying susceptibility artifact. Possible proteinaceous material not entirely excluded. No associated susceptibility artifact to suggest subarachnoid hemorrhage. No mass lesion, midline shift, or mass effect. Ventricles normal size without hydrocephalus. No appreciable intraventricular debris. No extra-axial fluid collection. No made of an empty sella. Midline structures intact. Vascular: Major intracranial vascular flow voids are maintained. Skull and upper cervical spine: Craniocervical junction within normal limits. Postsurgical changes partially visualize within the upper cervical spine. Bone marrow signal intensity heterogeneous and diffusely decreased on T1 weighted imaging, most like related history of leukemia. Susceptibility artifact involving the left greater than right scalp, of uncertain etiology. Sinuses/Orbits: Globes and orbital soft tissues demonstrate no acute finding.  Extensive opacity and mucosal thickening seen throughout the paranasal sinuses, with superimposed right maxillary sinus air-fluid level. Findings could be infectious and/or inflammatory nature. Trace bilateral mastoid effusions. Visualized inner ear structures grossly normal. Other: None. IMPRESSION: 1. Technically limited exam due to susceptibility artifact emanating from the scalp, of uncertain etiology. 2. Diffuse FLAIR hyperintensity throughout the cerebral sulci and surrounding the brainstem and cerebellum. While this finding could be artifactual in nature related to overlying susceptibility artifact, proteinaceous material and changes related to meningitis could also have this appearance. Correlation with LP and CSF studies recommended for correlated purposes as clinically warranted. No susceptibility artifact to suggest subarachnoid hemorrhage. 3. 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 4. Extensive pan sinusitis. This could be  either infectious or inflammatory in nature. Electronically Signed   By: Jeannine Boga M.D.   On: 01/24/2020 06:46   IR Fluoro Guide Ndl Plmt / BX  Result Date: 01/27/2020 CLINICAL DATA:  Confusion, altered mental status EXAM: DIAGNOSTIC LUMBAR PUNCTURE UNDER FLUOROSCOPIC GUIDANCE FLUOROSCOPY TIME:  0.1 minute; 12  uGym2 DAP PROCEDURE: Informed consent was obtained from the family prior to the procedure, including potential complications of headache, allergy, and pain. With the patient prone, the lower back was prepped with Betadine. Intravenous Fentanyl 3mcg and Versed 1mg  were administered as conscious sedation during continuous monitoring of the patient's level of consciousness and physiological / cardiorespiratory status by the radiology RN, with a total moderate sedation time of 35 minutes. 1% Lidocaine was used for local anesthesia. Lumbar puncture was performed at the L3-4 level from  a right parasagittal approach using a 20 gauge needle with return of yellow cloudy CSF with an opening pressure of 13 cm water. 9 ml of CSF were obtained for laboratory studies. The patient tolerated the procedure well and there were no apparent complications. IMPRESSION: 1. Technically successful lumbar puncture under fluoroscopy Electronically Signed   By: Lucrezia Europe M.D.   On: 01/27/2020 07:59   DG CHEST PORT 1 VIEW  Result Date: 01/27/2020 CLINICAL DATA:  Shortness of breath. History of leukemia. EXAM: PORTABLE CHEST 1 VIEW COMPARISON:  01/27/2020 FINDINGS: Midline trachea. Borderline cardiomegaly. Atherosclerosis in the transverse aorta. No pleural effusion or pneumothorax. Surgical clips in the left upper quadrant. No congestive failure. No lobar consolidation. IMPRESSION: Borderline cardiomegaly, without acute disease. Aortic Atherosclerosis (ICD10-I70.0). Electronically Signed   By: Abigail Miyamoto M.D.   On: 01/27/2020 19:16   DG CHEST PORT 1 VIEW  Result Date: 01/27/2020 CLINICAL DATA:  SOB (shortness of breath) EXAM: PORTABLE CHEST - 1 VIEW COMPARISON:  the previous day's study FINDINGS: Relatively low lung volumes with resultant crowding of bronchovascular structures. The mild interstitial edema seen previously continues to improve. No new airspace disease. Heart size within normal limits for technique. Aortic Atherosclerosis (ICD10-170.0). No effusion. No pneumothorax. Cervical fixation hardware partially visualized. Surgical clips in the upper abdomen. IMPRESSION: Improving interstitial edema. Electronically Signed   By: Lucrezia Europe M.D.   On: 01/27/2020 11:55   DG CHEST PORT 1 VIEW  Result Date: 01/26/2020 CLINICAL DATA:  Leukocytosis EXAM: PORTABLE CHEST 1 VIEW COMPARISON:  01/24/2020 FINDINGS: The cardiac silhouette, mediastinal and hilar contours are stable. Low lung volumes with vascular crowding and streaky atelectasis. Slight improved lung aeration since the prior study with less  vascular congestion and resolving edema. No pleural effusions. IMPRESSION: 1. Low lung volumes with vascular crowding and streaky atelectasis. 2. Improved lung aeration. Electronically Signed   By: Marijo Sanes M.D.   On: 01/26/2020 07:34   DG CHEST PORT 1 VIEW  Result Date: 01/24/2020 CLINICAL DATA:  Shortness of breath and hypoxia. EXAM: PORTABLE CHEST 1 VIEW COMPARISON:  Radiograph yesterday. FINDINGS: Lower lung volumes from prior exam. Prominent heart size likely accentuated by technique. Bronchovascular crowding versus vascular congestion. No significant pleural effusion. No pneumothorax. No confluent airspace disease. No acute osseous abnormalities are seen. IMPRESSION: Lower lung volumes from prior exam. Bronchovascular crowding versus vascular congestion. Prominent heart size likely accentuated by technique and low lung volumes. Electronically Signed   By: Keith Rake M.D.   On: 01/24/2020 19:19   DG Chest Port 1 View  Result Date: 01/23/2020 CLINICAL DATA:  69 year old female with sepsis. EXAM: PORTABLE CHEST 1 VIEW COMPARISON:  Chest  radiographs 12/19/2018 and earlier. FINDINGS: Portable AP semi upright view at 2045 hours. Larger lung volumes. Cardiac size at the upper limits of normal. Calcified aortic atherosclerosis. Other mediastinal contours are within normal limits. Visualized tracheal air column is within normal limits. Allowing for portable technique the lungs are clear. Chronic left upper quadrant surgical clips and cervical ACDF. Negative visible bowel gas pattern. No acute osseous abnormality identified. IMPRESSION: No acute cardiopulmonary abnormality. Aortic Atherosclerosis (ICD10-I70.0). Electronically Signed   By: Genevie Ann M.D.   On: 01/23/2020 20:55   EEG adult  Result Date: 01/24/2020 Lora Havens, MD     01/24/2020 10:59 AM Patient Name: DONEL BRITTIAN MRN: [id number] Epilepsy Attending: Lora Havens Referring Physician/Provider: Dr Amie [place] Date:  01/24/2020 Duration: 24.09 mins Patient history: 69 y.o.femalewith past medical history significant for leukemia, non-Hodgkin's lymphoma 2002 presented with altered mental status, fever, significantly elevated leukocytosis, with a rightward gaze preference. EEG to evaluate for status epilepticus. Level of alertness: lethargic AEDs during EEG study: Ativan, Keppra Technical aspects: This EEG study was done with scalp electrodes positioned according to the 10-20 International system of electrode placement. Electrical activity was acquired at a sampling rate of 500Hz  and reviewed with a high frequency filter of 70Hz  and a low frequency filter of 1Hz . EEG data were recorded continuously and digitally stored. DESCRIPTION: EEG showed continuous 3-4Hz  delta slowing in right hemisphere. There is also continuous 5-8Hz  theta-alpha activity in left hemisphere with intermittent 2-3hz  delta activity..  Hyperventilation and photic stimulation were not performed. ABNORMALITY - Continuous slow, generalized and lateralized right hemisphere IMPRESSION: This study is suggestive of cortical dysfunction in right hemisphere likely secondary to post-ictal state, underlying structural abnormality.  No seizures or definite epileptiform discharges were seen throughout the recording. Dr Rory Percy was notified. Priyanka Barbra Sarks   Overnight EEG with video  Result Date: 01/24/2020 Lora Havens, MD     01/25/2020 10:07 AM Patient Name: JAYMARIE BRADT MRN: [id number] Epilepsy Attending: Lora Havens Referring Physician/Provider: Dr Amie Portland Duration: 01/24/2020 [id number] to 01/25/2020 [id number]  Patient history: 69 y.o.femalewith past medical history significant for leukemia, non-Hodgkin's lymphoma 2002 presented with altered mental status, fever, significantly elevated leukocytosis, with a rightward gaze preference. EEG to evaluate for status epilepticus.  Level of alertness: lethargic  AEDs during EEG study: Ativan, Keppra  Technical  aspects: This EEG study was done with scalp electrodes positioned according to the 10-20 International system of electrode placement. Electrical activity was acquired at a sampling rate of 500Hz  and reviewed with a high frequency filter of 70Hz  and a low frequency filter of 1Hz . EEG data were recorded continuously and digitally stored.  DESCRIPTION: EEG showed continuous 5-8Hz  theta-alpha activity in left hemisphere with intermittent 2-3hz  delta activity which at times appears quasi rhythmic. Sharp waves were also noted in left frontal region. Continuous 3-4Hz  low amplitude delta slowing was also noted in right hemisphere. Hyperventilation and photic stimulation were not performed.  ABNORMALITY - Sharp waves, left frontotemporal region - Intermittent rhythmic delta activity, left frontal region - Continuous slow, generalized and lateralized right hemisphere  IMPRESSION: This study is suggestive of epileptogenicity and cortical dysfunction in left frontotemporal region. Quasi rhythmic delta activity with sharp waves in left frontotemporal region can be on the ictal-interictal continuum. Additionally, there is evidence of cortical dysfunction in right hemisphere likely secondary to post-ictal state, underlying structural abnormality.  No definite seizures were seen throughout the recording. Priyanka Barbra Sarks     Time Spent in minutes  [place] M.D on 01/29/2020 at 1:32 PM  To page go to [email or web address] - password [email or web address]

## 2020-01-29 NOTE — Progress Notes (Signed)
Nutrition Follow-up  DOCUMENTATION CODES:   Obesity unspecified  INTERVENTION:  MVI Daily  Ensure Enlive po TID, each supplement provides 350 kcal and 20 grams of protein  Magic cup TID with meals, each supplement provides 290 kcal and 9 grams of protein   NUTRITION DIAGNOSIS:   Inadequate oral intake related to poor appetite as evidenced by meal completion < 50%.    GOAL:   Patient will meet greater than or equal to 90% of their needs    MONITOR:   PO intake, Supplement acceptance, Weight trends, Labs  REASON FOR ASSESSMENT:   Malnutrition Screening Tool    ASSESSMENT:   Pt with a PMH significant for leukemia in remission, non-Hodgkin's lymphoma and hiatal hernia who presented to the ED with altered mental status, fever significantly elevated leukocytosis and rightward gaze preference on admission.  She is noted to have neck stiffness and imaging suggestive of meningitis  3/29 - s/p lumbar puncture, findings consistent with bacterial meningitis 3/30 - pt failed BSE 3/31 - DYS2/Thins diet ordered  Discussed pt with RN.   Per MD documentation, pt is adamant about leaving AMA and has since been IVC'd.   Pt reports poor appetite, but is agreeable to using supplements while admitted. Pt states PTA, she ate 3 meals per day.   PO Intake: 0-30% x 3 recorded meals   Reviewed weight history in chart. Pt noted to have weighed 77.1 kg on 12/01/19 and 77.3 kg on 12/30/19. All previous weight history from 2019 or earlier. Pt noted to have potential 17% weight loss since 10/31/17, which is not significant for time frame.  Admit weight: 79.8 kg Current weight: 80 kg  Medications reviewed. Labs reviewed: Na 146 (H), CBGs 91-120  UOP: 1,837ml x24 hours I/O: +1,338.60ml since admit   Diet Order:   Diet Order            DIET DYS 2 Room service appropriate? Yes with Assist; Fluid consistency: Thin  Diet effective now              EDUCATION NEEDS:   Not appropriate  for education at this time  Skin:  Skin Assessment: Skin Integrity Issues: Skin Integrity Issues:: Incisions, Other (Comment) Incisions: back Other: pressure injury sacrum  Last BM:  unknown  Height:   Ht Readings from Last 1 Encounters:  01/25/20 5' 2.64" (1.591 m)    Weight:   Wt Readings from Last 1 Encounters:  01/28/20 80 kg    BMI:  Body mass index is 31.6 kg/m.  Estimated Nutritional Needs:   Kcal:  1500-1700  Protein:  90-105 grams  Fluid:  >1.5 L   Larkin Ina, MS, RD, LDN RD pager number and weekend/on-call pager number located in [place].

## 2020-01-29 NOTE — Progress Notes (Signed)
SLP Cancellation Note  Patient Details Name: KEIGAN SEMONES MRN: [id number] DOB: Apr 08, 1951   Cancelled treatment:       Reason Eval/Treat Not Completed: Patient at procedure or test/unavailable(Pt off the unit at this time. SLP will f/u on subsequent date.)  Alejah Aristizabal I. Hardin Negus, [place], [place] Office number [phone] Pager [place] 01/29/2020, 4:18 PM

## 2020-01-29 NOTE — Progress Notes (Signed)
CSW notified by medical team that patient is agitated and threatening to leave AMA, difficult to redirect at this time. MD requested that CSW complete IVC for patient.   IVC has been completed and sent to magistrate. [employer] will come to serve patient.  Patient currently under active IVC through 02/03/20.  Laveda Abbe, [place] Clinical Social Worker [phone]

## 2020-01-30 ENCOUNTER — Inpatient Hospital Stay (HOSPITAL_COMMUNITY): Payer: Medicare HMO

## 2020-01-30 DIAGNOSIS — I350 Nonrheumatic aortic (valve) stenosis: Secondary | ICD-10-CM

## 2020-01-30 DIAGNOSIS — R000 Tachycardia, unspecified: Secondary | ICD-10-CM

## 2020-01-30 DIAGNOSIS — I351 Nonrheumatic aortic (valve) insufficiency: Secondary | ICD-10-CM

## 2020-01-30 LAB — CSF CULTURE W GRAM STAIN: Culture: NO GROWTH

## 2020-01-30 LAB — BASIC METABOLIC PANEL
Anion gap: 11 (ref 5–15)
BUN: 37 mg/dL — ABNORMAL HIGH (ref 8–23)
CO2: 18 mmol/L — ABNORMAL LOW (ref 22–32)
Calcium: 8.3 mg/dL — ABNORMAL LOW (ref 8.9–10.3)
Chloride: 115 mmol/L — ABNORMAL HIGH (ref 98–111)
Creatinine, Ser: 1.12 mg/dL — ABNORMAL HIGH (ref 0.44–1.00)
GFR calc Af Amer: 58 mL/min — ABNORMAL LOW (ref >60)
GFR calc non Af Amer: 50 mL/min — ABNORMAL LOW (ref >60)
Glucose, Bld: 98 mg/dL (ref 70–99)
Potassium: 4.1 mmol/L (ref 3.5–5.1)
Sodium: 144 mmol/L (ref 135–145)

## 2020-01-30 LAB — CBC
HCT: 34.1 % — ABNORMAL LOW (ref 36.0–46.0)
Hemoglobin: 11.3 g/dL — ABNORMAL LOW (ref 12.0–15.0)
MCH: 26.8 pg (ref 26.0–34.0)
MCHC: 33.1 g/dL (ref 30.0–36.0)
MCV: 81 fL (ref 80.0–100.0)
Platelets: 212 10*3/uL (ref 150–400)
RBC: 4.21 MIL/uL (ref 3.87–5.11)
RDW: 16.5 % — ABNORMAL HIGH (ref 11.5–15.5)
WBC: 25 10*3/uL — ABNORMAL HIGH (ref 4.0–10.5)
nRBC: 0.1 % (ref 0.0–0.2)

## 2020-01-30 LAB — ECHOCARDIOGRAM COMPLETE
Height: 62.638 in
Weight: 2821.89 oz

## 2020-01-30 LAB — GLUCOSE, CAPILLARY
Glucose-Capillary: 92 mg/dL (ref 70–99)
Glucose-Capillary: 132 mg/dL — ABNORMAL HIGH (ref 70–99)
Glucose-Capillary: 109 mg/dL — ABNORMAL HIGH (ref 70–99)

## 2020-01-30 MED ORDER — SODIUM CHLORIDE 0.9 % IV SOLN
750.0000 mg | Freq: Two times a day (BID) | INTRAVENOUS | Status: DC
  Administered 2020-01-30 – 2020-02-01 (×4): 750 mg via INTRAVENOUS
  Filled 2020-01-30 (×7): qty 7.5

## 2020-01-30 MED ORDER — DEXTROSE-NACL 5-0.45 % IV SOLN: INTRAVENOUS | Status: DC

## 2020-01-30 MED ORDER — ENOXAPARIN SODIUM 40 MG/0.4ML ~~LOC~~ SOLN
40.0000 mg | SUBCUTANEOUS | Status: DC
  Administered 2020-01-30 – 2020-02-04 (×6): 40 mg via SUBCUTANEOUS
  Filled 2020-01-30 (×6): qty 0.4

## 2020-01-30 MED ORDER — SODIUM CHLORIDE 0.9 % IV SOLN
2.0000 g | Freq: Two times a day (BID) | INTRAVENOUS | Status: DC
  Administered 2020-01-30 – 2020-01-31 (×2): 2 g via INTRAVENOUS
  Filled 2020-01-30 (×3): qty 2

## 2020-01-30 NOTE — Progress Notes (Signed)
[facility]  PROGRESS NOTE  Kathleen Duran [id number] DOB: [birth year] DOA: 01/23/2020 PCP: Arlyss Repress, MD Admit date - 01/23/2020   Admitting Physician Kerney Elbe, DO  Outpatient Primary MD for the patient is Arlyss Repress, MD  LOS - 7 Brief Narrative   Kathleen Duran is a 69 y.o. year old female with medical history significant forpromyelocytic leukemia under remission, hypertension, GERD  who presented on 01/23/2020 with fever, sudden confusion x 24 hours and vomiting and was found to have nuchal rigidity, leukocytosis of 70,000, and creatinine of 3.09 (normal in October) and admitted with working diagnosis of sepsis secondary to presumed meningitis.  Underwent unsuccessful LP in ED and started on empiric antibiotics for meningitis.  Hospital course complicated by altered mental status concern for meningitis based on MRI brain imaging.  Patient underwent LP on 3/29 which showed markedly elevated WBC of greater than 30,000 predominant neutrophilic, elevated protein of 292, and depressed glucose consistent with bacterial meningitis.HSV1/2 negative  Subjective  Today she is much more alert, still having some neck pain that improves with position, appetite improving  A & P   Altered mental status secondary to meningitis, likely strep pneumo, stable Has intermittent episodes of confusion, but currently alert and oriented x3 significantly improved from last few days of hospital stay.  Remains afebrile, based off of LP likely bacterial, given strep pneumo in blood this is likely etiology -Currently on meropenem, DC'd vancomycin, ampicillin, and acyclovir on 3/30 -Monitor CSF cultures currently pending with no growth -Patient IVC'd 3/31, does not have competency to leave AGAINST MEDICAL ADVICE  Sepsis secondary to Strep pneumo bacteremia with complicated meningitis/encephalitis and AKI,  All stemming from meningitis related to strep pneumo/bacteremia.  Question if  sinusitis was nidus for meningitis based on CT head imaging.  Remains afebrile, slight tachycardia with suspected dehydration, leukocytosis downtrending  -Daily CBC -Antibiotics as mentioned above -Monitor repeat blood cultures to document clearance -Obtain TTE  Sinus tachycardia Patient looks fine depleted to me on exam.  Likely due to dehydration.  Hemoglobin stable, other sepsis physiology parameters are improving -Continue IV fluids, encourage p.o. intake -Monitor on telemetry  Elevated LFTs, acute liver injury in the setting of sepsis, resolved Peak ALT of 240, and AST 161.  Now back to normal baseline.  Discrete skin rash on left leg, that extends to upper abdomen Husband reports this is a birthmark, however not at bedside to evaluate with me.  Doubt reaction to antibiotic given only on one side -We will assess with husband to confirm this is her baseline -Closely monitor  LTM concerning for epileptogenicity No definite seizures seen on LTM EEG recording -nephrology recommends continuing Keppra 750 mg twice daily, reduced because of AKI/CrCL  Hypernatremia, in the setting of diminished p.o. intake, improving -Currently on D5 half-normal/expect improvement now that patient able to tolerate diet (dysphagia 2 diet) -Repeat BMP in a.m.  AKI, improving.  Initially suspect related to contrast/diminished p.o. intake on admission with peak of 2.17, was 3.91 on 4/1 now down to 1.12.  That improved with foley placement in setting of urinary retention. ua unremarkable. Was on vancomycin but quick improvement makes less likely nidus -Continue IV fluids until ensure adequate oral intake -Avoid nephrotoxins (vancomycin has been DC'd) -foley in place -RUS showed mild bilateral pelviectasis without hydronephrosis,  monitor output  -Repeat BMP in am -Holding home irbesartan  HTN, at goal -home Lopressor at 25 mg qd (reduced from home dose) given concomitant tachycardia and closely  monitor -  Hold home irbesartan given AKI  GERD, stable -Continue home PPI  History of B-cell prolymphocytic leukemia, status post colectomy and chemotherapy Evaluated by oncology this admission, per their review peripheral smear seems more reactive, doubt relapse, likely all related to meningitis above.  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 Anticipated d/c date:  3-4 days. Barriers to d/c or necessity for inpatient status:  Continue IV meropenem for bacterial meningitis, close monitoring mental status intermittent episodes of confusion, needs stabilization of kidney function, electrolyte abnormalities Consults  : Neurology, oncology  Procedures  : 3/29, LP under fluoroscopy  DVT Prophylaxis  :  Lovenox  Lab Results  Component Value Date   PLT 212 01/30/2020    Diet :  Diet Order            DIET DYS 2 Room service appropriate? Yes with Assist; Fluid consistency: Thin  Diet effective now               Inpatient Medications Scheduled Meds: . chlorhexidine  15 mL Mouth Rinse BID  . Chlorhexidine Gluconate Cloth  6 each Topical Daily  . enoxaparin (LOVENOX) injection  30 mg Subcutaneous Q24H  . feeding supplement (ENSURE ENLIVE)  237 mL Oral TID BM  . mouth rinse  15 mL Mouth Rinse q12n4p  . metoprolol tartrate  25 mg Oral Daily  . multivitamin with minerals  1 tablet Oral Daily  . pantoprazole  40 mg Oral Daily  . sodium chloride flush  10-40 mL Intracatheter Q12H   Continuous Infusions: . dextrose 5 % and 0.45% NaCl 75 mL/hr at 01/30/20 1007  . levETIRAcetam 500 mg (01/30/20 0856)  . meropenem (MERREM) IV     PRN Meds:.acetaminophen, haloperidol lactate, labetalol, levalbuterol, LORazepam, ondansetron **OR** ondansetron (ZOFRAN) IV, sodium chloride flush  Antibiotics  :   Anti-infectives  (From admission, onward)   Start     Dose/Rate Route Frequency Ordered Stop   01/30/20 1400  meropenem (MERREM) 2 g in sodium chloride 0.9 % 100 mL IVPB     2 g 200 mL/hr over 30 Minutes Intravenous Every 12 hours 01/30/20 1003     01/29/20 1400  meropenem (MERREM) 1 g in sodium chloride 0.9 % 100 mL IVPB  Status:  Discontinued     1 g 200 mL/hr over 30 Minutes Intravenous Every 12 hours 01/29/20 1020 01/30/20 1003   01/28/20 1400  meropenem (MERREM) 2 g in sodium chloride 0.9 % 100 mL IVPB  Status:  Discontinued     2 g 200 mL/hr over 30 Minutes Intravenous Every 12 hours 01/28/20 1039 01/29/20 1020   01/25/20 1200  acyclovir (ZOVIRAX) 610 mg in dextrose 5 % 100 mL IVPB  Status:  Discontinued     610 mg 112.2 mL/hr over 60 Minutes Intravenous Every 8 hours 01/25/20 0924 01/25/20 0933   01/25/20 1000  meropenem (MERREM) 2 g in sodium chloride 0.9 % 100 mL IVPB  Status:  Discontinued     2 g 200 mL/hr over 30 Minutes Intravenous Every 8 hours 01/25/20 0919 01/28/20 1039   01/25/20 1000  vancomycin (VANCOREADY) IVPB 750 mg/150 mL  Status:  Discontinued     750 mg 150 mL/hr over 60 Minutes Intravenous Every 12 hours 01/25/20 0919 01/27/20 1547   01/24/20 0230  acyclovir (ZOVIRAX) 680 mg  in dextrose 5 % 100 mL IVPB  Status:  Discontinued     680 mg 113.6 mL/hr over 60 Minutes Intravenous Every 24 hours 01/24/20 0137 01/25/20 0924   01/23/20 2200  meropenem (MERREM) 1 g in sodium chloride 0.9 % 100 mL IVPB  Status:  Discontinued     1 g 200 mL/hr over 30 Minutes Intravenous Every 8 hours 01/23/20 2042 01/23/20 2044   01/23/20 2200  meropenem (MERREM) 2 g in sodium chloride 0.9 % 100 mL IVPB  Status:  Discontinued     2 g 200 mL/hr over 30 Minutes Intravenous Every 8 hours 01/23/20 2044 01/23/20 2052   01/23/20 2100  meropenem (MERREM) 1 g in sodium chloride 0.9 % 100 mL IVPB  Status:  Discontinued     1 g 200 mL/hr over 30 Minutes Intravenous Every 12 hours 01/23/20 2052 01/25/20 0919    01/23/20 2055  vancomycin variable dose per unstable renal function (pharmacist dosing)  Status:  Discontinued      Does not apply See admin instructions 01/23/20 2056 01/25/20 0934   01/23/20 2045  vancomycin (VANCOREADY) IVPB 1750 mg/350 mL     1,750 mg 175 mL/hr over 120 Minutes Intravenous  Once 01/23/20 2042 01/24/20 0010       Objective   Vitals:   01/29/20 2355 01/30/20 0606 01/30/20 0619 01/30/20 0900  BP: 132/63 (!) 110/50  (!) 135/56  Pulse: 94 97 94 (!) 108  Resp: (!) 22 (!) 29 20 (!) 22  Temp: 98.9 F (37.2 C)   98.2 F (36.8 C)  TempSrc: Oral Oral  Oral  SpO2: 100% 100% 100% 100%  Weight:      Height:        SpO2: 100 % O2 Flow Rate (L/min): 2 L/min  Wt Readings from Last 3 Encounters:  01/28/20 80 kg  04/22/18 92.6 kg  04/17/16 95.4 kg     Intake/Output Summary (Last 24 hours) at 01/30/2020 1128 Last data filed at 01/30/2020 [id number] Gross per 24 hour  Intake 2782.88 ml  Output 4175 ml  Net -1392.12 ml    Physical Exam:     Awake Alert, Oriented X to self, place, time (year, month, season), context Follow simple commands [place].AT, Normal respiratory effort on , CTAB RRR,No Gallops,Rubs or new Murmurs,  +ve B.Sounds, Abd Soft, No tenderness, No rebound, guarding or rigidity. Left Leg         I have personally reviewed the following:   Data Reviewed:  CBC Recent Labs  Lab 01/25/20 0839 01/25/20 0839 01/25/20 1804 01/25/20 1804 01/26/20 0901 01/27/20 0700 01/28/20 0421 01/29/20 0759 01/30/20 0554  WBC 62.1*   < > 51.3*   < > 33.6* 25.3* 24.4* 31.1* 25.0*  HGB 12.4   < > 12.6   < > 11.6* 13.3 13.4 12.4 11.3*  HCT 39.1   < > 38.0   < > 34.0* 39.9 40.4 37.9 34.1*  PLT 134*   < > 146*   < > 179 111* 187 199 212  MCV 85.4   < > 81.5   < > 79.3* 80.0 81.1 81.5 81.0  MCH 27.1   < > 27.0   < > 27.0 26.7 26.9 26.7 26.8  MCHC 31.7   < > 33.2   < > 34.1 33.3 33.2 32.7 33.1  RDW 17.8*   < > 17.3*   < > 16.7* 17.7* 17.1* 16.7* 16.5*  LYMPHSABS  1.2  --  1.2  --  6.4* 3.3 1.5  --   --  MONOABS 7.8*  --  6.0*  --  2.4* 6.6* 6.7*  --   --   EOSABS 0.0  --  0.0  --  1.7* 0.0 0.2  --   --   BASOSABS 0.0  --  0.2*  --  0.3* 0.0 0.1  --   --    < > = values in this interval not displayed.    Chemistries  Recent Labs  Lab 01/24/20 1819 01/25/20 0324 01/25/20 0324 01/25/20 0839 01/25/20 1804 01/25/20 1804 01/26/20 1223 01/26/20 1422 01/27/20 1211 01/28/20 0421 01/29/20 0759 01/30/20 0554  NA  --  147*   < >  --  148*   < > 148*  --  150* 152* 146* 144  K  --  3.7   < >  --  3.9   < > 4.4  --  4.5 3.6 4.1 4.1  CL  --  119*   < >  --  117*   < > 118*  --  119* 118* 114* 115*  CO2  --  17*   < >  --  19*   < > 19*  --  19* 20* 17* 18*  GLUCOSE  --  140*   < >  --  156*   < > 135*  --  140* 136* 124* 98  BUN  --  31*   < >  --  26*   < > 23  --  38* 48* 77* 37*  CREATININE  --  0.93   < >  --  0.73   < > 0.75  --  1.23* 1.95* 3.91* 1.12*  CALCIUM  --  8.2*   < >  --  8.4*   < > 8.5*  --  8.1* 8.7* 8.5* 8.3*  MG   < >  --   --  2.4 2.2  --   --  2.1 2.3 2.3  --   --   AST  --  39  --   --  22  --  31  --  32 19  --   --   ALT  --  113*  --   --  86*  --  70*  --  50* 42  --   --   ALKPHOS  --  150*  --   --  123  --  117  --  102 96  --   --   BILITOT  --  1.1   < >  --  1.3*  --  QUANTITY NOT SUFFICIENT, UNABLE TO PERFORM TEST 1.4* 2.0* 1.0  --   --    < > = values in this interval not displayed.   ------------------------------------------------------------------------------------------------------------------ No results for input(s): CHOL, HDL, LDLCALC, TRIG, CHOLHDL, LDLDIRECT in the last 72 hours.  No results found for: HGBA1C ------------------------------------------------------------------------------------------------------------------ No results for input(s): TSH, T4TOTAL, T3FREE, THYROIDAB in the last 72 hours.  Invalid input(s):  FREET3 ------------------------------------------------------------------------------------------------------------------ No results for input(s): VITAMINB12, FOLATE, FERRITIN, TIBC, IRON, RETICCTPCT in the last 72 hours.  Coagulation profile Recent Labs  Lab 01/23/20 2030  INR 1.3*    No results for input(s): DDIMER in the last 72 hours.  Cardiac Enzymes No results for input(s): CKMB, TROPONINI, MYOGLOBIN in the last 168 hours.  Invalid input(s): CK ------------------------------------------------------------------------------------------------------------------ No results found for: BNP  Micro Results Recent Results (from the past 240 hour(s))  Blood Culture (routine x 2)     Status: Abnormal   Collection Time:  01/23/20  8:29 PM   Specimen: BLOOD LEFT HAND  Result Value Ref Range Status   Specimen Description BLOOD LEFT HAND  Final   Special Requests   Final    BOTTLES DRAWN AEROBIC AND ANAEROBIC Blood Culture adequate volume   Culture  Setup Time   Final    GRAM POSITIVE COCCI IN PAIRS IN BOTH AEROBIC AND ANAEROBIC BOTTLES CRITICAL RESULT CALLED TO, READ BACK BY AND VERIFIED WITH: T. BAUMEISTER, PHARMD AT 1120 ON 01/24/20 BY C. JESSUP, MT.    Culture (A)  Final    STREPTOCOCCUS PNEUMONIAE SUSCEPTIBILITIES PERFORMED ON PREVIOUS CULTURE WITHIN THE LAST 5 DAYS. Performed at [facility], [place] [address], [place], [place] [address]    Report Status 01/26/2020 FINAL  Final  Blood Culture (routine x 2)     Status: Abnormal   Collection Time: 01/23/20  8:55 PM   Specimen: BLOOD RIGHT HAND  Result Value Ref Range Status   Specimen Description BLOOD RIGHT HAND  Final   Special Requests   Final    BOTTLES DRAWN AEROBIC AND ANAEROBIC Blood Culture results may not be optimal due to an inadequate volume of blood received in culture bottles   Culture  Setup Time   Final    GRAM POSITIVE COCCI IN PAIRS IN BOTH AEROBIC AND ANAEROBIC BOTTLES CRITICAL RESULT CALLED TO, READ  BACK BY AND VERIFIED WITH: T. BAUMEISTER, PHARMD AT 1120 ON 01/24/20 BY C. JESSUP, MT. Performed at [facility], [place] [address] [place], [place] [address]    Culture STREPTOCOCCUS PNEUMONIAE (A)  Final   Report Status 01/26/2020 FINAL  Final   Organism ID, Bacteria STREPTOCOCCUS PNEUMONIAE  Final      Susceptibility   Streptococcus pneumoniae - MIC*    ERYTHROMYCIN <=0.12 SENSITIVE Sensitive     LEVOFLOXACIN 0.5 SENSITIVE Sensitive     VANCOMYCIN 0.25 SENSITIVE Sensitive     PENICILLIN (meningitis) 0.12 RESISTANT Resistant     PENO - penicillin 0.12      PENICILLIN (non-meningitis) 0.12 SENSITIVE Sensitive     PENICILLIN (oral) 0.12 INTERMEDIATE Intermediate     CEFTRIAXONE (non-meningitis) <=0.12 SENSITIVE Sensitive     CEFTRIAXONE (meningitis) <=0.12 SENSITIVE Sensitive     * STREPTOCOCCUS PNEUMONIAE  Blood Culture ID Panel (Reflexed)     Status: Abnormal   Collection Time: 01/23/20  8:55 PM  Result Value Ref Range Status   Enterococcus species NOT DETECTED NOT DETECTED Final   Listeria monocytogenes NOT DETECTED NOT DETECTED Final   Staphylococcus species NOT DETECTED NOT DETECTED Final   Staphylococcus aureus (BCID) NOT DETECTED NOT DETECTED Final   Streptococcus species DETECTED (A) NOT DETECTED Final    Comment: CRITICAL RESULT CALLED TO, READ BACK BY AND VERIFIED WITH: T. BAUMEISTER, PHARMD AT 1120 ON 01/24/20 BY C. JESSUP, MT.    Streptococcus agalactiae NOT DETECTED NOT DETECTED Final   Streptococcus pneumoniae DETECTED (A) NOT DETECTED Final    Comment: CRITICAL RESULT CALLED TO, READ BACK BY AND VERIFIED WITH: T. BAUMEISTER, PHARMD AT 1120 ON 01/24/20 BY C. JESSUP, MT.    Streptococcus pyogenes NOT DETECTED NOT DETECTED Final   Acinetobacter baumannii NOT DETECTED NOT DETECTED Final   Enterobacteriaceae species NOT DETECTED NOT DETECTED Final   Enterobacter cloacae complex NOT DETECTED NOT DETECTED Final   Escherichia coli NOT DETECTED NOT DETECTED Final    Klebsiella oxytoca NOT DETECTED NOT DETECTED Final   Klebsiella pneumoniae NOT DETECTED NOT DETECTED Final   Proteus species NOT DETECTED NOT  DETECTED Final   Serratia marcescens NOT DETECTED NOT DETECTED Final   Haemophilus influenzae NOT DETECTED NOT DETECTED Final   Neisseria meningitidis NOT DETECTED NOT DETECTED Final   Pseudomonas aeruginosa NOT DETECTED NOT DETECTED Final   Candida albicans NOT DETECTED NOT DETECTED Final   Candida glabrata NOT DETECTED NOT DETECTED Final   Candida krusei NOT DETECTED NOT DETECTED Final   Candida parapsilosis NOT DETECTED NOT DETECTED Final   Candida tropicalis NOT DETECTED NOT DETECTED Final    Comment: Performed at [facility], [place] [address], [place], [place] [address]  Urine culture     Status: None   Collection Time: 01/23/20  9:17 PM   Specimen: In/Out Cath Urine  Result Value Ref Range Status   Specimen Description IN/OUT CATH URINE  Final   Special Requests NONE  Final   Culture   Final    NO GROWTH Performed at [facility], [place] [address] [place], [place] [address]    Report Status 01/25/2020 FINAL  Final  SARS CORONAVIRUS 2 (TAT 6-24 HRS) Nasopharyngeal Nasopharyngeal Swab     Status: None   Collection Time: 01/23/20 11:06 PM   Specimen: Nasopharyngeal Swab  Result Value Ref Range Status   SARS Coronavirus 2 NEGATIVE NEGATIVE Final    Comment: (NOTE) SARS-CoV-2 target nucleic acids are NOT DETECTED. The SARS-CoV-2 RNA is generally detectable in upper and lower respiratory specimens during the acute phase of infection. Negative results do not preclude SARS-CoV-2 infection, do not rule out co-infections with other pathogens, and should not be used as the sole basis for treatment or other patient management decisions. Negative results must be combined with clinical observations, patient history, and epidemiological information. The expected result is Negative. Fact Sheet for  Patients: SugarRoll.be Fact Sheet for Healthcare Providers: https://www.woods-mathews.com/ This test is not yet approved or cleared by the [place] FDA and  has been authorized for detection and/or diagnosis of SARS-CoV-2 by FDA under an Emergency Use Authorization (EUA). This EUA will remain  in effect (meaning this test can be used) for the duration of the COVID-19 declaration under Section 56 4(b)(1) of the Act, 21 U.S.C. section 360bbb-3(b)(1), unless the authorization is terminated or revoked sooner. Performed at [facility], [place] [address] [place], [place] [address]   CSF culture     Status: None   Collection Time: 01/26/20  5:37 PM   Specimen: PATH Cytology CSF; Cerebrospinal Fluid  Result Value Ref Range Status   Specimen Description CSF  Final   Special Requests NONE  Final   Gram Stain   Final    WBC PRESENT, PREDOMINANTLY PMN NO ORGANISMS SEEN CYTOSPIN SMEAR    Culture   Final    NO GROWTH 3 DAYS Performed at [facility], [place] [address] [place], [place] [address]    Report Status 01/30/2020 FINAL  Final  Culture, fungus without smear     Status: None (Preliminary result)   Collection Time: 01/26/20  5:37 PM   Specimen: CSF  Result Value Ref Range Status   Specimen Description CSF  Final   Special Requests NONE  Final   Culture   Final    NO FUNGUS ISOLATED AFTER 3 DAYS Performed at [facility], [place] [address] [place], [place] [address]    Report Status PENDING  Incomplete    Radiology Reports CT Abdomen Pelvis Wo Contrast  Result Date: 01/23/2020 CLINICAL DATA:  Patient appears jaundiced history of lymphoma  EXAM: CT ABDOMEN AND PELVIS WITHOUT CONTRAST TECHNIQUE: Multidetector CT imaging of the abdomen and pelvis was performed following the standard protocol without IV contrast. COMPARISON:  None. FINDINGS: Lower chest: The visualized heart size within normal limits. No pericardial  fluid/thickening. No hiatal hernia. The visualized portions of the lungs are clear. Hepatobiliary: Although limited due to the lack of intravenous contrast, normal in appearance without gross focal abnormality. The patient is status post cholecystectomy. No biliary ductal dilation. Pancreas:  Unremarkable.  No surrounding inflammatory changes. 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 Adrenals/Urinary Tract: Both adrenal glands appear normal. The kidneys and collecting system appear normal without evidence of urinary tract calculus or hydronephrosis. Bladder is unremarkable. Stomach/Bowel: The stomach, small bowel, and colon are normal in appearance. No inflammatory changes or obstructive findings. Scattered colonic diverticula are noted without diverticulitis. Appendix is normal. Vascular/Lymphatic: There are no enlarged abdominal or pelvic lymph nodes. Scattered aortic atherosclerotic calcifications are seen without aneurysmal dilatation. Reproductive: The uterus and adnexa are unremarkable. Other: No evidence of abdominal wall mass or hernia. Musculoskeletal: No acute or significant osseous findings. IMPRESSION: No acute intra-abdominal or pelvic pathology to explain the patient's symptoms. Diverticulosis without diverticulitis. Aortic Atherosclerosis (ICD10-I70.0). Electronically Signed   By: Duran Pair M.D.   On: 01/23/2020 22:03   CT Head Wo Contrast  Result Date: 01/23/2020 CLINICAL DATA:  Altered mental status. Left facial droop. EXAM: CT HEAD WITHOUT CONTRAST TECHNIQUE: Contiguous axial images were obtained from the base of the skull through the vertex without intravenous contrast. COMPARISON:  Brain MRI 11/24/2019 FINDINGS: Brain: No acute hemorrhage. No extra-axial or subdural collection. Mild generalized atrophy with moderate periventricular and deep white matter hypodensity consistent with chronic small vessel ischemia. This appears slightly  asymmetric in the right cerebral hemisphere but no definite cortical infarct. Small calcification in left basal ganglia, likely senescent. No midline shift, hydrocephalus or suspicious mass effect. Vascular: Atherosclerosis of skullbase vasculature without hyperdense vessel or abnormal calcification. Skull: No fracture or focal lesion. Sinuses/Orbits: Chronic paranasal sinus disease with near complete opacification of frontal sinuses, ethmoid air cells, left sphenoid and maxillary sinus. Mucosal thickening in the right sphenoid and maxillary sinus. The degree sinus opacification has slightly progressed from prior MRI. Mastoid air cells are clear. Other: None. IMPRESSION: 1. No hemorrhage or evidence of acute infarct. 2. Mild atrophy with moderate chronic small vessel ischemia. 3. Chronic paranasal sinus disease, slightly progressed from January MRI. Electronically Signed   By: Keith Rake M.D.   On: 01/23/2020 22:02   MR BRAIN WO CONTRAST  Result Date: 01/24/2020 CLINICAL DATA:  Initial evaluation for acute encephalopathy, altered mental status. History of leukemia, in remission. EXAM: MRI HEAD WITHOUT CONTRAST TECHNIQUE: Multiplanar, multiecho pulse sequences of the brain and surrounding structures were obtained without intravenous contrast. COMPARISON:  Prior CT from 01/23/2020 as well as previous MRI from 11/24/2019. FINDINGS: Brain: Examination somewhat technically limited by susceptibility artifact emanating from the scalp. Generalized age-related cerebral atrophy. Patchy T2/FLAIR hyperintensity within the periventricular and deep white matter both cerebral hemispheres again seen, nonspecific, but most like related chronic microvascular ischemic disease, stable from previous. No abnormal foci of restricted diffusion to suggest acute or subacute ischemia. Gray-white matter differentiation maintained. No encephalomalacia to suggest chronic cortical infarction. Diffuse FLAIR hyperintensity seen throughout  the cerebral sulci, suspected to be artifactual nature due to overlying susceptibility artifact. Possible proteinaceous material not entirely excluded. No associated susceptibility artifact to suggest subarachnoid hemorrhage. No  mass lesion, midline shift, or mass effect. Ventricles normal size without hydrocephalus. No appreciable intraventricular debris. No extra-axial fluid collection. No made of an empty sella. Midline structures intact. Vascular: Major intracranial vascular flow voids are maintained. Skull and upper cervical spine: Craniocervical junction within normal limits. Postsurgical changes partially visualize within the upper cervical spine. Bone marrow signal intensity heterogeneous and diffusely decreased on T1 weighted imaging, most like related history of leukemia. Susceptibility artifact involving the left greater than right scalp, of uncertain etiology. Sinuses/Orbits: Globes and orbital soft tissues demonstrate no acute finding. Extensive opacity and mucosal thickening seen throughout the paranasal sinuses, with superimposed right maxillary sinus air-fluid level. Findings could be infectious and/or inflammatory nature. Trace bilateral mastoid effusions. Visualized inner ear structures grossly normal. Other: None. IMPRESSION: 1. Technically limited exam due to susceptibility artifact emanating from the scalp, of uncertain etiology. 2. Diffuse FLAIR hyperintensity throughout the cerebral sulci and surrounding the brainstem and cerebellum. While this finding could be artifactual in nature related to overlying susceptibility artifact, proteinaceous material and changes related to meningitis could also have this appearance. Correlation with LP and CSF studies recommended for correlated purposes as clinically warranted. No susceptibility artifact to suggest subarachnoid hemorrhage. 3. Moderate cerebral white matter disease, most likely related to chronic microvascular ischemic change, stable from  previous. 4. Extensive pan sinusitis. This could be either infectious or inflammatory in nature. Electronically Signed   By: Jeannine Boga M.D.   On: 01/24/2020 06:46   US RENAL  Result Date: 01/29/2020 CLINICAL DATA:  Acute kidney injury EXAM: RENAL / URINARY TRACT ULTRASOUND COMPLETE COMPARISON:  None. FINDINGS: Right Kidney: Renal measurements: 11.1 x 6.2 x 6.6 cm = volume: Is 236 mL. There is mild pelviectasis without frank hydronephrosis. Left Kidney: Renal measurements: 12 x 6.5 x 6.3 cm = volume: 255 mL. There is mild pelviectasis without frank hydronephrosis. Bladder: Only the left ureteral jet was visualized. Other: None. IMPRESSION: 1. Mild bilateral pelviectasis without evidence for frank hydronephrosis. 2. Only the left ureteral jet was visualized on today's exam. 3. No shadowing echogenic stones identified on this study. Electronically Signed   By: Constance Holster M.D.   On: 01/29/2020 16:46   IR Fluoro Guide Ndl Plmt / BX  Result Date: 01/27/2020 CLINICAL DATA:  Confusion, altered mental status EXAM: DIAGNOSTIC LUMBAR PUNCTURE UNDER FLUOROSCOPIC GUIDANCE FLUOROSCOPY TIME:  0.1 minute; 12  uGym2 DAP PROCEDURE: Informed consent was obtained from the family prior to the procedure, including potential complications of headache, allergy, and pain. With the patient prone, the lower back was prepped with Betadine. Intravenous Fentanyl 33mcg and Versed 1mg  were administered as conscious sedation during continuous monitoring of the patient's level of consciousness and physiological / cardiorespiratory status by the radiology RN, with a total moderate sedation time of 35 minutes. 1% Lidocaine was used for local anesthesia. Lumbar puncture was performed at the L3-4 level from a right parasagittal approach using a 20 gauge needle with return of yellow cloudy CSF with an opening pressure of 13 cm water. 9 ml of CSF were obtained for laboratory studies. The patient tolerated the procedure well and  there were no apparent complications. IMPRESSION: 1. Technically successful lumbar puncture under fluoroscopy Electronically Signed   By: Lucrezia Europe M.D.   On: 01/27/2020 07:59   DG CHEST PORT 1 VIEW  Result Date: 01/27/2020 CLINICAL DATA:  Shortness of breath. History of leukemia. EXAM: PORTABLE CHEST 1 VIEW COMPARISON:  01/27/2020 FINDINGS: Midline trachea. Borderline cardiomegaly. Atherosclerosis in the transverse aorta.  No pleural effusion or pneumothorax. Surgical clips in the left upper quadrant. No congestive failure. No lobar consolidation. IMPRESSION: Borderline cardiomegaly, without acute disease. Aortic Atherosclerosis (ICD10-I70.0). Electronically Signed   By: Abigail Miyamoto M.D.   On: 01/27/2020 19:16   DG CHEST PORT 1 VIEW  Result Date: 01/27/2020 CLINICAL DATA:  SOB (shortness of breath) EXAM: PORTABLE CHEST - 1 VIEW COMPARISON:  the previous day's study FINDINGS: Relatively low lung volumes with resultant crowding of bronchovascular structures. The mild interstitial edema seen previously continues to improve. No new airspace disease. Heart size within normal limits for technique. Aortic Atherosclerosis (ICD10-170.0). No effusion. No pneumothorax. Cervical fixation hardware partially visualized. Surgical clips in the upper abdomen. IMPRESSION: Improving interstitial edema. Electronically Signed   By: Lucrezia Europe M.D.   On: 01/27/2020 11:55   DG CHEST PORT 1 VIEW  Result Date: 01/26/2020 CLINICAL DATA:  Leukocytosis EXAM: PORTABLE CHEST 1 VIEW COMPARISON:  01/24/2020 FINDINGS: The cardiac silhouette, mediastinal and hilar contours are stable. Low lung volumes with vascular crowding and streaky atelectasis. Slight improved lung aeration since the prior study with less vascular congestion and resolving edema. No pleural effusions. IMPRESSION: 1. Low lung volumes with vascular crowding and streaky atelectasis. 2. Improved lung aeration. Electronically Signed   By: Marijo Sanes M.D.   On:  01/26/2020 07:34   DG CHEST PORT 1 VIEW  Result Date: 01/24/2020 CLINICAL DATA:  Shortness of breath and hypoxia. EXAM: PORTABLE CHEST 1 VIEW COMPARISON:  Radiograph yesterday. FINDINGS: Lower lung volumes from prior exam. Prominent heart size likely accentuated by technique. Bronchovascular crowding versus vascular congestion. No significant pleural effusion. No pneumothorax. No confluent airspace disease. No acute osseous abnormalities are seen. IMPRESSION: Lower lung volumes from prior exam. Bronchovascular crowding versus vascular congestion. Prominent heart size likely accentuated by technique and low lung volumes. Electronically Signed   By: Keith Rake M.D.   On: 01/24/2020 19:19   DG Chest Port 1 View  Result Date: 01/23/2020 CLINICAL DATA:  69 year old female with sepsis. EXAM: PORTABLE CHEST 1 VIEW COMPARISON:  Chest radiographs 12/19/2018 and earlier. FINDINGS: Portable AP semi upright view at 2045 hours. Larger lung volumes. Cardiac size at the upper limits of normal. Calcified aortic atherosclerosis. Other mediastinal contours are within normal limits. Visualized tracheal air column is within normal limits. Allowing for portable technique the lungs are clear. Chronic left upper quadrant surgical clips and cervical ACDF. Negative visible bowel gas pattern. No acute osseous abnormality identified. IMPRESSION: No acute cardiopulmonary abnormality. Aortic Atherosclerosis (ICD10-I70.0). Electronically Signed   By: Genevie Ann M.D.   On: 01/23/2020 20:55   EEG adult  Result Date: 01/24/2020 Lora Havens, MD     01/24/2020 10:59 AM Patient Name: MUMTAZ ARMETTA MRN: [id number] Epilepsy Attending: Lora Havens Referring Physician/Provider: Dr Amie [place] Date: 01/24/2020 Duration: 24.09 mins Patient history: 69 y.o.femalewith past medical history significant for leukemia, non-Hodgkin's lymphoma 2002 presented with altered mental status, fever, significantly elevated leukocytosis, with  a rightward gaze preference. EEG to evaluate for status epilepticus. Level of alertness: lethargic AEDs during EEG study: Ativan, Keppra Technical aspects: This EEG study was done with scalp electrodes positioned according to the 10-20 International system of electrode placement. Electrical activity was acquired at a sampling rate of 500Hz  and reviewed with a high frequency filter of 70Hz  and a low frequency filter of 1Hz . EEG data were recorded continuously and digitally stored. DESCRIPTION: EEG showed continuous 3-4Hz  delta slowing in right hemisphere. There is also continuous 5-8Hz  theta-alpha  activity in left hemisphere with intermittent 2-3hz  delta activity..  Hyperventilation and photic stimulation were not performed. ABNORMALITY - Continuous slow, generalized and lateralized right hemisphere IMPRESSION: This study is suggestive of cortical dysfunction in right hemisphere likely secondary to post-ictal state, underlying structural abnormality.  No seizures or definite epileptiform discharges were seen throughout the recording. Dr Rory Percy was notified. Priyanka Barbra Sarks   Overnight EEG with video  Result Date: 01/24/2020 Lora Havens, MD     01/25/2020 10:07 AM Patient Name: VERNICIA ROCKWOOD MRN: [id number] Epilepsy Attending: Lora Havens Referring Physician/Provider: Dr Amie Portland Duration: 01/24/2020 [id number] to 01/25/2020 [id number]  Patient history: 69 y.o.femalewith past medical history significant for leukemia, non-Hodgkin's lymphoma 2002 presented with altered mental status, fever, significantly elevated leukocytosis, with a rightward gaze preference. EEG to evaluate for status epilepticus.  Level of alertness: lethargic  AEDs during EEG study: Ativan, Keppra  Technical aspects: This EEG study was done with scalp electrodes positioned according to the 10-20 International system of electrode placement. Electrical activity was acquired at a sampling rate of 500Hz  and reviewed with a high frequency  filter of 70Hz  and a low frequency filter of 1Hz . EEG data were recorded continuously and digitally stored.  DESCRIPTION: EEG showed continuous 5-8Hz  theta-alpha activity in left hemisphere with intermittent 2-3hz  delta activity which at times appears quasi rhythmic. Sharp waves were also noted in left frontal region. Continuous 3-4Hz  low amplitude delta slowing was also noted in right hemisphere. Hyperventilation and photic stimulation were not performed.  ABNORMALITY - Sharp waves, left frontotemporal region - Intermittent rhythmic delta activity, left frontal region - Continuous slow, generalized and lateralized right hemisphere  IMPRESSION: This study is suggestive of epileptogenicity and cortical dysfunction in left frontotemporal region. Quasi rhythmic delta activity with sharp waves in left frontotemporal region can be on the ictal-interictal continuum. Additionally, there is evidence of cortical dysfunction in right hemisphere likely secondary to post-ictal state, underlying structural abnormality.  No definite seizures were seen throughout the recording. Priyanka Barbra Sarks     Time Spent in minutes  30     Desiree Hane M.D on 01/30/2020 at 11:28 AM  To page go to [email or web address] - password [facility]

## 2020-01-30 NOTE — Progress Notes (Signed)
PHARMACY NOTE:  ANTIMICROBIAL RENAL DOSAGE ADJUSTMENT  Current antimicrobial regimen includes a mismatch between antimicrobial dosage and estimated renal function.  As per policy approved by the Pharmacy & Therapeutics and Medical Executive Committees, the antimicrobial dosage will be adjusted accordingly.  Current antimicrobial dosage:  Meropenem 1g IV q12h  Indication: Streptococcus pneumoniae bacteremia and meningitis   Renal Function:  Estimated Creatinine Clearance: 47.1 mL/min (A) (by C-G formula based on SCr of 1.12 mg/dL (H)). []      On intermittent HD, scheduled: []      On CRRT    Antimicrobial dosage has been changed to:  Meropenem 2g IV q12h  Additional comments:   Thank you for allowing pharmacy to be a part of this patient's care.  Phillis Haggis, [facility] 01/30/2020 10:03 AM

## 2020-01-30 NOTE — Progress Notes (Signed)
  Speech Language Pathology Treatment: Dysphagia  Patient Details Name: Kathleen Duran MRN: [id number] DOB: 1951-09-20 Today's Date: 01/30/2020 Time: [id number] SLP Time Calculation (min) (ACUTE ONLY): 15 min  Assessment / Plan / Recommendation Clinical Impression  Pt was seen for dysphagia treatment and was cooperative throughout the session. 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 Pt's husband further stated that the pt has been using a straw against SLP's recommendations but has been tolerating it well. Pt tolerated regular texture solids, mixed consistency boluses and thin liquids via straw using consecutive swallows without symptoms of oropharyngeal dysphagia. It is recommended that her diet be upgraded to regular texture solids and thin liquids SLP will continue to follow pt.    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 Started on empiric antibiotics for possible meningitis and admitted for further work-up.      SLP Plan  Continue with current plan of care       Recommendations  Diet recommendations: Regular;Thin liquid Liquids provided via: Cup;Straw Medication Administration: Whole meds with puree Supervision: Staff to assist with self feeding;Intermittent supervision to cue for compensatory strategies Compensations: Slow rate;Small sips/bites;Follow solids with liquid Postural Changes and/or Swallow Maneuvers: Seated upright 90 degrees                Oral Care Recommendations: Oral care BID Follow up Recommendations: Other (comment)(TBA) SLP Visit Diagnosis: Dysphagia, unspecified (R13.10) Plan: Continue with current plan of  care       Jeraline Marcinek I. Hardin Negus, [place], [place] Office number [phone] Pager [place] 01/30/2020, 5:50 PM

## 2020-01-30 NOTE — Progress Notes (Signed)
  Echocardiogram 2D Echocardiogram has been performed.  Keilynn Marano A Anahis Furgeson 01/30/2020, 3:29 PM

## 2020-01-31 DIAGNOSIS — I352 Nonrheumatic aortic (valve) stenosis with insufficiency: Secondary | ICD-10-CM

## 2020-01-31 DIAGNOSIS — Z881 Allergy status to other antibiotic agents status: Secondary | ICD-10-CM

## 2020-01-31 DIAGNOSIS — R634 Abnormal weight loss: Secondary | ICD-10-CM

## 2020-01-31 DIAGNOSIS — R011 Cardiac murmur, unspecified: Secondary | ICD-10-CM

## 2020-01-31 DIAGNOSIS — J849 Interstitial pulmonary disease, unspecified: Secondary | ICD-10-CM | POA: Diagnosis present

## 2020-01-31 DIAGNOSIS — J324 Chronic pansinusitis: Secondary | ICD-10-CM

## 2020-01-31 DIAGNOSIS — Z9221 Personal history of antineoplastic chemotherapy: Secondary | ICD-10-CM

## 2020-01-31 DIAGNOSIS — R918 Other nonspecific abnormal finding of lung field: Secondary | ICD-10-CM

## 2020-01-31 DIAGNOSIS — Z885 Allergy status to narcotic agent status: Secondary | ICD-10-CM

## 2020-01-31 DIAGNOSIS — Z856 Personal history of leukemia: Secondary | ICD-10-CM

## 2020-01-31 DIAGNOSIS — G001 Pneumococcal meningitis: Secondary | ICD-10-CM | POA: Diagnosis present

## 2020-01-31 DIAGNOSIS — Z91041 Radiographic dye allergy status: Secondary | ICD-10-CM

## 2020-01-31 DIAGNOSIS — K219 Gastro-esophageal reflux disease without esophagitis: Secondary | ICD-10-CM | POA: Diagnosis present

## 2020-01-31 DIAGNOSIS — Z91048 Other nonmedicinal substance allergy status: Secondary | ICD-10-CM

## 2020-01-31 DIAGNOSIS — R509 Fever, unspecified: Secondary | ICD-10-CM

## 2020-01-31 DIAGNOSIS — Z880 Allergy status to penicillin: Secondary | ICD-10-CM

## 2020-01-31 DIAGNOSIS — Z9081 Acquired absence of spleen: Secondary | ICD-10-CM

## 2020-01-31 LAB — BASIC METABOLIC PANEL
Anion gap: 10 (ref 5–15)
BUN: 22 mg/dL (ref 8–23)
CO2: 21 mmol/L — ABNORMAL LOW (ref 22–32)
Calcium: 8.1 mg/dL — ABNORMAL LOW (ref 8.9–10.3)
Chloride: 110 mmol/L (ref 98–111)
Creatinine, Ser: 0.81 mg/dL (ref 0.44–1.00)
GFR calc Af Amer: >60 mL/min (ref >60)
GFR calc non Af Amer: >60 mL/min (ref >60)
Glucose, Bld: 117 mg/dL — ABNORMAL HIGH (ref 70–99)
Potassium: 3.7 mmol/L (ref 3.5–5.1)
Sodium: 141 mmol/L (ref 135–145)

## 2020-01-31 LAB — CBC
HCT: 32.8 % — ABNORMAL LOW (ref 36.0–46.0)
Hemoglobin: 10.9 g/dL — ABNORMAL LOW (ref 12.0–15.0)
MCH: 27.1 pg (ref 26.0–34.0)
MCHC: 33.2 g/dL (ref 30.0–36.0)
MCV: 81.6 fL (ref 80.0–100.0)
Platelets: 280 10*3/uL (ref 150–400)
RBC: 4.02 MIL/uL (ref 3.87–5.11)
RDW: 16.2 % — ABNORMAL HIGH (ref 11.5–15.5)
WBC: 28.1 10*3/uL — ABNORMAL HIGH (ref 4.0–10.5)
nRBC: 0 % (ref 0.0–0.2)

## 2020-01-31 LAB — GLUCOSE, CAPILLARY
Glucose-Capillary: 100 mg/dL — ABNORMAL HIGH (ref 70–99)
Glucose-Capillary: 106 mg/dL — ABNORMAL HIGH (ref 70–99)
Glucose-Capillary: 89 mg/dL (ref 70–99)
Glucose-Capillary: 92 mg/dL (ref 70–99)

## 2020-01-31 MED ORDER — SODIUM CHLORIDE 0.9 % IV SOLN
INTRAVENOUS | Status: DC | PRN
  Administered 2020-01-31: 250 mL via INTRAVENOUS
  Administered 2020-02-01: 500 mL via INTRAVENOUS
  Administered 2020-02-03: 250 mL via INTRAVENOUS

## 2020-01-31 MED ORDER — SODIUM CHLORIDE 0.9 % IV SOLN: INTRAVENOUS | Status: AC

## 2020-01-31 MED ORDER — SODIUM CHLORIDE 0.9 % IV SOLN
INTRAVENOUS | Status: DC | PRN
  Administered 2020-02-05: 250 mL via INTRAVENOUS

## 2020-01-31 MED ORDER — SODIUM CHLORIDE 0.9 % IV SOLN
2.0000 g | Freq: Three times a day (TID) | INTRAVENOUS | Status: AC
  Administered 2020-01-31 – 2020-02-05 (×16): 2 g via INTRAVENOUS
  Filled 2020-01-31 (×17): qty 2

## 2020-01-31 MED ORDER — METOPROLOL TARTRATE 12.5 MG HALF TABLET
12.5000 mg | ORAL_TABLET | Freq: Two times a day (BID) | ORAL | Status: DC
  Administered 2020-01-31 – 2020-02-01 (×2): 12.5 mg via ORAL
  Filled 2020-01-31 (×2): qty 1

## 2020-01-31 NOTE — Progress Notes (Signed)
PHARMACY NOTE:  ANTIMICROBIAL RENAL DOSAGE ADJUSTMENT  Current antimicrobial regimen includes a mismatch between antimicrobial dosage and estimated renal function.  As per policy approved by the Pharmacy & Therapeutics and Medical Executive Committees, the antimicrobial dosage will be adjusted accordingly.  Current antimicrobial dosage:  Meropenem 2g IV q12h  Indication: Streptococcus pneumoniae bacteremia and meningitis   Renal Function:  Estimated Creatinine Clearance: 68.1 mL/min (by C-G formula based on SCr of 0.81 mg/dL). []      On intermittent HD, scheduled: []      On CRRT    Antimicrobial dosage has been changed to:  Meropenem 2g IV q8h  Additional comments:   Thank you for involving pharmacy in this patient's care.  Renold Genta, PharmD, BCPS Clinical Pharmacist Clinical phone for 01/31/2020 until 3p is [phone] 01/31/2020 9:38 AM  **Pharmacist phone directory can be found on [email or web address] listed under [place]**

## 2020-01-31 NOTE — Progress Notes (Signed)
[facility]  PROGRESS NOTE  Kathleen Duran [id number] DOB: [birth year] DOA: 01/23/2020 PCP: Arlyss Repress, MD Admit date - 01/23/2020   Admitting Physician Kerney Elbe, DO  Outpatient Primary MD for the patient is Arlyss Repress, MD  LOS - 8 Brief Narrative   Kathleen Duran is a 69 y.o. year old female with medical history significant forpromyelocytic leukemia under remission, hypertension, GERD  who presented on 01/23/2020 with fever, sudden confusion x 24 hours and vomiting and was found to have nuchal rigidity, leukocytosis of 70,000, and creatinine of 3.09 (normal in October) and admitted with working diagnosis of sepsis secondary to presumed meningitis.  Underwent unsuccessful LP in ED and started on empiric antibiotics for meningitis.  Hospital course complicated by altered mental status concern for meningitis based on MRI brain imaging.  Patient underwent LP on 3/29 which showed markedly elevated WBC of greater than 30,000 predominant neutrophilic, elevated protein of 292, and depressed glucose consistent with bacterial meningitis.HSV1/2 negative  Subjective  Today she continues to be alert. Complains of neck pain and headache.  A & P   Altered mental status secondary to meningitis, likely strep pneumo given concomitant S.pna bacteremia, stable Mental status back to baseline still having neck pain and headache but no focal deficits.  H.  Remains afebrile, based off of LP likely bacterial, given strep pneumo in blood this is likely etiology -Currently on meropenem, DC'd vancomycin, ampicillin, and acyclovir on 3/30 -Monitor CSF cultures currently pending with no growth -Patient IVC'd 3/31, does not have competency to leave AGAINST MEDICAL ADVICE  Sepsis secondary to Strep pneumo bacteremia and Strep pna meningitis/encephalitis and AKI,  All stemming from meningitis related to strep pneumo/bacteremia.  Question if sinusitis was nidus for meningitis based on  CT head imaging. Spiked fever and continues to be tachycardic on meropenem monotherapy, repeat blood cultures seem to be showing clearance, TTE with no vegetations, no other obvious signs of infection. -Currently on meropenem --Consulted ID, may be able to go to ceftriaxone given no anaphylaxis history to cephalosporins I will defer to ID -Daily CBC -Monitor repeat blood cultures to document clearance  Sinus tachycardia Patient still looks volume depleted to me on exam.  Likely due to dehydration, though given fever could be related to ongoing infection.  Hemoglobin stable, -Continue IV fluids, encourage p.o. intake -Monitor on telemetry  Elevated LFTs, acute liver injury in the setting of sepsis, resolved Peak ALT of 240, and AST 161.  Now back to normal baseline.  Discrete skin discoloration on left leg, that extends to upper abdomen Husband reports this is a birthmark at bedside.  -Closely monitor  LTM concerning for epileptogenicity No definite seizures seen on LTM EEG recording -nephrology recommends continuing Keppra 750 mg twice daily, reduced because of AKI/CrCL  Hypernatremia, in the setting of diminished p.o. intake, resolved -Currently on D5 half-normal/expect improvement now that patient able to tolerate diet (dysphagia 2 diet) -Repeat BMP in a.m.  AKI, resolved. Multifactorial etiology: contrast/diminished p.o. intake/urinary retention/briefly on vancomycin. Peak Cr  3.91 on 4/1 now back to baseline. Responded well to IVF  -Avoid nephrotoxins (vancomycin has been DC'd) -foley in place -RUS showed mild bilateral pelviectasis without hydronephrosis,  monitor output  -Daily BMP -Holding home irbesartan  HTN, at goal -home Lopressor at 25 mg qd (reduced from home dose) given concomitant tachycardia and closely monitor -Hold home irbesartan given AKI  GERD, stable -Continue home PPI  History of B-cell prolymphocytic leukemia, status post colectomy and  chemotherapy Evaluated  by oncology this admission, per their review peripheral smear seems more reactive, doubt relapse, likely all related to meningitis above.  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 Updated at bedside on 4/2  Code Status : Full  Disposition Plan  :  Patient is from home. Anticipated d/c date:  3-4 days. Barriers to d/c or necessity for inpatient status:  Continue IV meropenem for bacterial meningitis still spiking fevers with tachycardia, close monitoring mental status, needs stabilization of kidney function, electrolyte abnormalities Consults  : Neurology, oncology, ID  Procedures  : 3/29, LP under fluoroscopy  DVT Prophylaxis  :  Lovenox  Lab Results  Component Value Date   PLT 280 01/31/2020    Diet :  Diet Order            Diet regular Room service appropriate? Yes with Assist; Fluid consistency: Thin  Diet effective now               Inpatient Medications Scheduled Meds: . chlorhexidine  15 mL Mouth Rinse BID  . Chlorhexidine Gluconate Cloth  6 each Topical Daily  . enoxaparin (LOVENOX) injection  40 mg Subcutaneous Q24H  . feeding supplement (ENSURE ENLIVE)  237 mL Oral TID BM  . mouth rinse  15 mL Mouth Rinse q12n4p  . metoprolol tartrate  25 mg Oral Daily  . multivitamin with minerals  1 tablet Oral Daily  . pantoprazole  40 mg Oral Daily  . sodium chloride flush  10-40 mL Intracatheter Q12H   Continuous Infusions: . sodium chloride    . sodium chloride 10 mL/hr at 01/31/20 0534  . dextrose 5 % and 0.45% NaCl 75 mL/hr at 01/31/20 0534  . levETIRAcetam Stopped (01/31/20 0010)  . meropenem (MERREM) IV Stopped (01/31/20 0203)   PRN Meds:.sodium chloride, sodium chloride, acetaminophen, haloperidol lactate, labetalol, levalbuterol, LORazepam, ondansetron **OR** ondansetron (ZOFRAN) IV, sodium chloride flush  Antibiotics  :    Anti-infectives (From admission, onward)   Start     Dose/Rate Route Frequency Ordered Stop   01/30/20 1400  meropenem (MERREM) 2 g in sodium chloride 0.9 % 100 mL IVPB     2 g 200 mL/hr over 30 Minutes Intravenous Every 12 hours 01/30/20 1003     01/29/20 1400  meropenem (MERREM) 1 g in sodium chloride 0.9 % 100 mL IVPB  Status:  Discontinued     1 g 200 mL/hr over 30 Minutes Intravenous Every 12 hours 01/29/20 1020 01/30/20 1003   01/28/20 1400  meropenem (MERREM) 2 g in sodium chloride 0.9 % 100 mL IVPB  Status:  Discontinued     2 g 200 mL/hr over 30 Minutes Intravenous Every 12 hours 01/28/20 1039 01/29/20 1020   01/25/20 1200  acyclovir (ZOVIRAX) 610 mg in dextrose 5 % 100 mL IVPB  Status:  Discontinued     610 mg 112.2 mL/hr over 60 Minutes Intravenous Every 8 hours 01/25/20 0924 01/25/20 0933   01/25/20 1000  meropenem (MERREM) 2 g in sodium chloride 0.9 % 100 mL IVPB  Status:  Discontinued     2 g 200 mL/hr over 30 Minutes Intravenous Every 8 hours 01/25/20 0919 01/28/20 1039   01/25/20 1000  vancomycin (VANCOREADY) IVPB 750 mg/150 mL  Status:  Discontinued     750 mg 150 mL/hr over 60 Minutes Intravenous Every 12 hours 01/25/20 0919 01/27/20 1547   01/24/20 0230  acyclovir (ZOVIRAX)  680 mg in dextrose 5 % 100 mL IVPB  Status:  Discontinued     680 mg 113.6 mL/hr over 60 Minutes Intravenous Every 24 hours 01/24/20 0137 01/25/20 0924   01/23/20 2200  meropenem (MERREM) 1 g in sodium chloride 0.9 % 100 mL IVPB  Status:  Discontinued     1 g 200 mL/hr over 30 Minutes Intravenous Every 8 hours 01/23/20 2042 01/23/20 2044   01/23/20 2200  meropenem (MERREM) 2 g in sodium chloride 0.9 % 100 mL IVPB  Status:  Discontinued     2 g 200 mL/hr over 30 Minutes Intravenous Every 8 hours 01/23/20 2044 01/23/20 2052   01/23/20 2100  meropenem (MERREM) 1 g in sodium chloride 0.9 % 100 mL IVPB  Status:  Discontinued     1 g 200 mL/hr over 30 Minutes Intravenous Every 12 hours 01/23/20 2052  01/25/20 0919   01/23/20 2055  vancomycin variable dose per unstable renal function (pharmacist dosing)  Status:  Discontinued      Does not apply See admin instructions 01/23/20 2056 01/25/20 0934   01/23/20 2045  vancomycin (VANCOREADY) IVPB 1750 mg/350 mL     1,750 mg 175 mL/hr over 120 Minutes Intravenous  Once 01/23/20 2042 01/24/20 0010       Objective   Vitals:   01/30/20 1325 01/30/20 1953 01/30/20 2351 01/31/20 0427  BP: (!) 127/49 (!) 126/52 (!) 117/51 (!) 128/58  Pulse: 96 (!) 110 92 (!) 111  Resp: (!) 22 (!) 28 18   Temp: 99.7 F (37.6 C) 99.3 F (37.4 C) 97.9 F (36.6 C) (!) 101.5 F (38.6 C)  TempSrc: Axillary Axillary Oral Oral  SpO2: 100% 98% 96% 100%  Weight:   87.1 kg   Height:        SpO2: 100 % O2 Flow Rate (L/min): 2 L/min  Wt Readings from Last 3 Encounters:  01/30/20 87.1 kg  04/22/18 92.6 kg  04/17/16 95.4 kg     Intake/Output Summary (Last 24 hours) at 01/31/2020 [id number] Last data filed at 01/31/2020 [id number] Gross per 24 hour  Intake 2796.19 ml  Output 4150 ml  Net -1353.81 ml    Physical Exam:     Awake Alert, Oriented X to self, place, time (year, month, season), context Follows all commands [place].AT, Normal respiratory effort on , CTAB Tachycardic,No Gallops,Rubs or new Murmurs,  +ve B.Sounds, Abd Soft, No tenderness, No rebound, guarding or rigidity. Left Leg         I have personally reviewed the following:   Data Reviewed:  CBC Recent Labs  Lab 01/25/20 0839 01/25/20 0839 01/25/20 1804 01/25/20 1804 01/26/20 0901 01/26/20 0901 01/27/20 0700 01/28/20 0421 01/29/20 0759 01/30/20 0554 01/31/20 0509  WBC 62.1*   < > 51.3*   < > 33.6*   < > 25.3* 24.4* 31.1* 25.0* 28.1*  HGB 12.4   < > 12.6   < > 11.6*   < > 13.3 13.4 12.4 11.3* 10.9*  HCT 39.1   < > 38.0   < > 34.0*   < > 39.9 40.4 37.9 34.1* 32.8*  PLT 134*   < > 146*   < > 179   < > 111* 187 199 212 280  MCV 85.4   < > 81.5   < > 79.3*   < > 80.0 81.1 81.5 81.0  81.6  MCH 27.1   < > 27.0   < > 27.0   < > 26.7 26.9 26.7 26.8 27.1  MCHC 31.7   < > 33.2   < > 34.1   < > 33.3 33.2 32.7 33.1 33.2  RDW 17.8*   < > 17.3*   < > 16.7*   < > 17.7* 17.1* 16.7* 16.5* 16.2*  LYMPHSABS 1.2  --  1.2  --  6.4*  --  3.3 1.5  --   --   --   MONOABS 7.8*  --  6.0*  --  2.4*  --  6.6* 6.7*  --   --   --   EOSABS 0.0  --  0.0  --  1.7*  --  0.0 0.2  --   --   --   BASOSABS 0.0  --  0.2*  --  0.3*  --  0.0 0.1  --   --   --    < > = values in this interval not displayed.    Chemistries  Recent Labs  Lab 01/24/20 1819 01/25/20 0324 01/25/20 0324 01/25/20 0839 01/25/20 1804 01/25/20 1804 01/26/20 1223 01/26/20 1223 01/26/20 1422 01/27/20 1211 01/28/20 0421 01/29/20 0759 01/30/20 0554 01/31/20 0509  NA  --  147*   < >  --  148*   < > 148*   < >  --  150* 152* 146* 144 141  K  --  3.7   < >  --  3.9   < > 4.4   < >  --  4.5 3.6 4.1 4.1 3.7  CL  --  119*   < >  --  117*   < > 118*   < >  --  119* 118* 114* 115* 110  CO2  --  17*   < >  --  19*   < > 19*   < >  --  19* 20* 17* 18* 21*  GLUCOSE  --  140*   < >  --  156*   < > 135*   < >  --  140* 136* 124* 98 117*  BUN  --  31*   < >  --  26*   < > 23   < >  --  38* 48* 77* 37* 22  CREATININE  --  0.93   < >  --  0.73   < > 0.75   < >  --  1.23* 1.95* 3.91* 1.12* 0.81  CALCIUM  --  8.2*   < >  --  8.4*   < > 8.5*   < >  --  8.1* 8.7* 8.5* 8.3* 8.1*  MG   < >  --   --  2.4 2.2  --   --   --  2.1 2.3 2.3  --   --   --   AST  --  39  --   --  22  --  31  --   --  32 19  --   --   --   ALT  --  113*  --   --  86*  --  70*  --   --  50* 42  --   --   --   ALKPHOS  --  150*  --   --  123  --  117  --   --  102 96  --   --   --   BILITOT  --  1.1   < >  --  1.3*  --  QUANTITY NOT SUFFICIENT, UNABLE TO PERFORM TEST  --  1.4* 2.0* 1.0  --   --   --    < > = values in this interval not displayed.   ------------------------------------------------------------------------------------------------------------------ No  results for input(s): CHOL, HDL, LDLCALC, TRIG, CHOLHDL, LDLDIRECT in the last 72 hours.  No results found for: HGBA1C ------------------------------------------------------------------------------------------------------------------ No results for input(s): TSH, T4TOTAL, T3FREE, THYROIDAB in the last 72 hours.  Invalid input(s): FREET3 ------------------------------------------------------------------------------------------------------------------ No results for input(s): VITAMINB12, FOLATE, FERRITIN, TIBC, IRON, RETICCTPCT in the last 72 hours.  Coagulation profile No results for input(s): INR, PROTIME in the last 168 hours.  No results for input(s): DDIMER in the last 72 hours.  Cardiac Enzymes No results for input(s): CKMB, TROPONINI, MYOGLOBIN in the last 168 hours.  Invalid input(s): CK ------------------------------------------------------------------------------------------------------------------ No results found for: BNP  Micro Results Recent Results (from the past 240 hour(s))  Blood Culture (routine x 2)     Status: Abnormal   Collection Time: 01/23/20  8:29 PM   Specimen: BLOOD LEFT HAND  Result Value Ref Range Status   Specimen Description BLOOD LEFT HAND  Final   Special Requests   Final    BOTTLES DRAWN AEROBIC AND ANAEROBIC Blood Culture adequate volume   Culture  Setup Time   Final    GRAM POSITIVE COCCI IN PAIRS IN BOTH AEROBIC AND ANAEROBIC BOTTLES CRITICAL RESULT CALLED TO, READ BACK BY AND VERIFIED WITH: T. BAUMEISTER, PHARMD AT 1120 ON 01/24/20 BY C. JESSUP, MT.    Culture (A)  Final    STREPTOCOCCUS PNEUMONIAE SUSCEPTIBILITIES PERFORMED ON PREVIOUS CULTURE WITHIN THE LAST 5 DAYS. Performed at [facility], [place] [address], [place], [place] [address]    Report Status 01/26/2020 FINAL  Final  Blood Culture (routine x 2)     Status: Abnormal   Collection Time: 01/23/20  8:55 PM   Specimen: BLOOD RIGHT HAND  Result Value Ref Range Status    Specimen Description BLOOD RIGHT HAND  Final   Special Requests   Final    BOTTLES DRAWN AEROBIC AND ANAEROBIC Blood Culture results may not be optimal due to an inadequate volume of blood received in culture bottles   Culture  Setup Time   Final    GRAM POSITIVE COCCI IN PAIRS IN BOTH AEROBIC AND ANAEROBIC BOTTLES CRITICAL RESULT CALLED TO, READ BACK BY AND VERIFIED WITH: T. BAUMEISTER, PHARMD AT 1120 ON 01/24/20 BY C. JESSUP, MT. Performed at [facility], [place] [address], [place], [place] [address]    Culture STREPTOCOCCUS PNEUMONIAE (A)  Final   Report Status 01/26/2020 FINAL  Final   Organism ID, Bacteria STREPTOCOCCUS PNEUMONIAE  Final      Susceptibility   Streptococcus pneumoniae - MIC*    ERYTHROMYCIN <=0.12 SENSITIVE Sensitive     LEVOFLOXACIN 0.5 SENSITIVE Sensitive     VANCOMYCIN 0.25 SENSITIVE Sensitive     PENICILLIN (meningitis) 0.12 RESISTANT Resistant     PENO - penicillin 0.12      PENICILLIN (non-meningitis) 0.12 SENSITIVE Sensitive     PENICILLIN (oral) 0.12 INTERMEDIATE Intermediate     CEFTRIAXONE (non-meningitis) <=0.12 SENSITIVE Sensitive     CEFTRIAXONE (meningitis) <=0.12 SENSITIVE Sensitive     * STREPTOCOCCUS PNEUMONIAE  Blood Culture ID Panel (Reflexed)     Status: Abnormal   Collection Time: 01/23/20  8:55 PM  Result Value Ref Range Status   Enterococcus species NOT DETECTED NOT DETECTED Final   Listeria monocytogenes NOT DETECTED NOT DETECTED Final   Staphylococcus species NOT DETECTED NOT DETECTED Final   Staphylococcus  aureus (BCID) NOT DETECTED NOT DETECTED Final   Streptococcus species DETECTED (A) NOT DETECTED Final    Comment: CRITICAL RESULT CALLED TO, READ BACK BY AND VERIFIED WITH: T. BAUMEISTER, PHARMD AT 1120 ON 01/24/20 BY C. JESSUP, MT.    Streptococcus agalactiae NOT DETECTED NOT DETECTED Final   Streptococcus pneumoniae DETECTED (A) NOT DETECTED Final    Comment: CRITICAL RESULT CALLED TO, READ BACK BY AND VERIFIED WITH: T.  BAUMEISTER, PHARMD AT 1120 ON 01/24/20 BY C. JESSUP, MT.    Streptococcus pyogenes NOT DETECTED NOT DETECTED Final   Acinetobacter baumannii NOT DETECTED NOT DETECTED Final   Enterobacteriaceae species NOT DETECTED NOT DETECTED Final   Enterobacter cloacae complex NOT DETECTED NOT DETECTED Final   Escherichia coli NOT DETECTED NOT DETECTED Final   Klebsiella oxytoca NOT DETECTED NOT DETECTED Final   Klebsiella pneumoniae NOT DETECTED NOT DETECTED Final   Proteus species NOT DETECTED NOT DETECTED Final   Serratia marcescens NOT DETECTED NOT DETECTED Final   Haemophilus influenzae NOT DETECTED NOT DETECTED Final   Neisseria meningitidis NOT DETECTED NOT DETECTED Final   Pseudomonas aeruginosa NOT DETECTED NOT DETECTED Final   Candida albicans NOT DETECTED NOT DETECTED Final   Candida glabrata NOT DETECTED NOT DETECTED Final   Candida krusei NOT DETECTED NOT DETECTED Final   Candida parapsilosis NOT DETECTED NOT DETECTED Final   Candida tropicalis NOT DETECTED NOT DETECTED Final    Comment: Performed at [facility], [place] [address], [place], [place] [address]  Urine culture     Status: None   Collection Time: 01/23/20  9:17 PM   Specimen: In/Out Cath Urine  Result Value Ref Range Status   Specimen Description IN/OUT CATH URINE  Final   Special Requests NONE  Final   Culture   Final    NO GROWTH Performed at [facility], [place] [address], [place], [place] [address]    Report Status 01/25/2020 FINAL  Final  SARS CORONAVIRUS 2 (TAT 6-24 HRS) Nasopharyngeal Nasopharyngeal Swab     Status: None   Collection Time: 01/23/20 11:06 PM   Specimen: Nasopharyngeal Swab  Result Value Ref Range Status   SARS Coronavirus 2 NEGATIVE NEGATIVE Final    Comment: (NOTE) SARS-CoV-2 target nucleic acids are NOT DETECTED. The SARS-CoV-2 RNA is generally detectable in upper and lower respiratory specimens during the acute phase of infection. Negative results do not preclude SARS-CoV-2  infection, do not rule out co-infections with other pathogens, and should not be used as the sole basis for treatment or other patient management decisions. Negative results must be combined with clinical observations, patient history, and epidemiological information. The expected result is Negative. Fact Sheet for Patients: SugarRoll.be Fact Sheet for Healthcare Providers: https://www.woods-mathews.com/ This test is not yet approved or cleared by the [place] FDA and  has been authorized for detection and/or diagnosis of SARS-CoV-2 by FDA under an Emergency Use Authorization (EUA). This EUA will remain  in effect (meaning this test can be used) for the duration of the COVID-19 declaration under Section 56 4(b)(1) of the Act, 21 U.S.C. section 360bbb-3(b)(1), unless the authorization is terminated or revoked sooner. Performed at [facility], [place] [address], [place], [place] [address]   CSF culture     Status: None   Collection Time: 01/26/20  5:37 PM   Specimen: PATH Cytology CSF; Cerebrospinal Fluid  Result Value Ref Range Status   Specimen Description CSF  Final   Special Requests NONE  Final  Gram Stain   Final    WBC PRESENT, PREDOMINANTLY PMN NO ORGANISMS SEEN CYTOSPIN SMEAR    Culture   Final    NO GROWTH 3 DAYS Performed at [place] [address], [place], [place] [address]    Report Status 01/30/2020 FINAL  Final  Culture, fungus without smear     Status: None (Preliminary result)   Collection Time: 01/26/20  5:37 PM   Specimen: CSF  Result Value Ref Range Status   Specimen Description CSF  Final   Special Requests NONE  Final   Culture   Final    NO FUNGUS ISOLATED AFTER 3 DAYS Performed at [facility], [place] [address] [place], [place] [address]    Report Status PENDING  Incomplete  Culture, blood (Routine X 2) w Reflex to ID Panel     Status: None (Preliminary result)   Collection Time:  01/29/20  3:11 PM   Specimen: BLOOD  Result Value Ref Range Status   Specimen Description BLOOD RIGHT ANTECUBITAL  Final   Special Requests   Final    BOTTLES DRAWN AEROBIC AND ANAEROBIC Blood Culture adequate volume   Culture   Final    NO GROWTH < 24 HOURS Performed at [facility], [place] [address], [place], [place] [address]    Report Status PENDING  Incomplete  Culture, blood (Routine X 2) w Reflex to ID Panel     Status: None (Preliminary result)   Collection Time: 01/29/20  3:19 PM   Specimen: BLOOD RIGHT HAND  Result Value Ref Range Status   Specimen Description BLOOD RIGHT HAND  Final   Special Requests IN PEDIATRIC BOTTLE Blood Culture adequate volume  Final   Culture   Final    NO GROWTH < 24 HOURS Performed at [facility], [place] [address], [place],  [address]    Report Status PENDING  Incomplete    Radiology Reports CT Abdomen Pelvis Wo Contrast  Result Date: 01/23/2020 CLINICAL DATA:  Patient appears jaundiced history of lymphoma EXAM: CT ABDOMEN AND PELVIS WITHOUT CONTRAST TECHNIQUE: Multidetector CT imaging of the abdomen and pelvis was performed following the standard protocol without IV contrast. COMPARISON:  None. FINDINGS: Lower chest: The visualized heart size within normal limits. No pericardial fluid/thickening. No hiatal hernia. The visualized portions of the lungs are clear. Hepatobiliary: Although limited due to the lack of intravenous contrast, normal in appearance without gross focal abnormality. The patient is status post cholecystectomy. No biliary ductal dilation. Pancreas:  Unremarkable.  No surrounding inflammatory changes. 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 Adrenals/Urinary Tract: Both adrenal glands appear normal. The kidneys and collecting system appear normal without evidence of urinary tract calculus or hydronephrosis. Bladder is unremarkable. Stomach/Bowel: The stomach, small  bowel, and colon are normal in appearance. No inflammatory changes or obstructive findings. Scattered colonic diverticula are noted without diverticulitis. Appendix is normal. Vascular/Lymphatic: There are no enlarged abdominal or pelvic lymph nodes. Scattered aortic atherosclerotic calcifications are seen without aneurysmal dilatation. Reproductive: The uterus and adnexa are unremarkable. Other: No evidence of abdominal wall mass or hernia. Musculoskeletal: No acute or significant osseous findings. IMPRESSION: No acute intra-abdominal or pelvic pathology to explain the patient's symptoms. Diverticulosis without diverticulitis. Aortic Atherosclerosis (ICD10-I70.0). Electronically Signed   By: Prudencio Pair M.D.   On: 01/23/2020 22:03   CT Head Wo Contrast  Result Date: 01/23/2020 CLINICAL DATA:  Altered mental status. Left  facial droop. EXAM: CT HEAD WITHOUT CONTRAST TECHNIQUE: Contiguous axial images were obtained from the base of the skull through the vertex without intravenous contrast. COMPARISON:  Brain MRI 11/24/2019 FINDINGS: Brain: No acute hemorrhage. No extra-axial or subdural collection. Mild generalized atrophy with moderate periventricular and deep white matter hypodensity consistent with chronic small vessel ischemia. This appears slightly asymmetric in the right cerebral hemisphere but no definite cortical infarct. Small calcification in left basal ganglia, likely senescent. No midline shift, hydrocephalus or suspicious mass effect. Vascular: Atherosclerosis of skullbase vasculature without hyperdense vessel or abnormal calcification. Skull: No fracture or focal lesion. Sinuses/Orbits: Chronic paranasal sinus disease with near complete opacification of frontal sinuses, ethmoid air cells, left sphenoid and maxillary sinus. Mucosal thickening in the right sphenoid and maxillary sinus. The degree sinus opacification has slightly progressed from prior MRI. Mastoid air cells are clear. Other: None.  IMPRESSION: 1. No hemorrhage or evidence of acute infarct. 2. Mild atrophy with moderate chronic small vessel ischemia. 3. Chronic paranasal sinus disease, slightly progressed from January MRI. Electronically Signed   By: Keith Rake M.D.   On: 01/23/2020 22:02   MR BRAIN WO CONTRAST  Result Date: 01/24/2020 CLINICAL DATA:  Initial evaluation for acute encephalopathy, altered mental status. History of leukemia, in remission. EXAM: MRI HEAD WITHOUT CONTRAST TECHNIQUE: Multiplanar, multiecho pulse sequences of the brain and surrounding structures were obtained without intravenous contrast. COMPARISON:  Prior CT from 01/23/2020 as well as previous MRI from 11/24/2019. FINDINGS: Brain: Examination somewhat technically limited by susceptibility artifact emanating from the scalp. Generalized age-related cerebral atrophy. Patchy T2/FLAIR hyperintensity within the periventricular and deep white matter both cerebral hemispheres again seen, nonspecific, but most like related chronic microvascular ischemic disease, stable from previous. No abnormal foci of restricted diffusion to suggest acute or subacute ischemia. Gray-white matter differentiation maintained. No encephalomalacia to suggest chronic cortical infarction. Diffuse FLAIR hyperintensity seen throughout the cerebral sulci, suspected to be artifactual nature due to overlying susceptibility artifact. Possible proteinaceous material not entirely excluded. No associated susceptibility artifact to suggest subarachnoid hemorrhage. No mass lesion, midline shift, or mass effect. Ventricles normal size without hydrocephalus. No appreciable intraventricular debris. No extra-axial fluid collection. No made of an empty sella. Midline structures intact. Vascular: Major intracranial vascular flow voids are maintained. Skull and upper cervical spine: Craniocervical junction within normal limits. Postsurgical changes partially visualize within the upper cervical spine. Bone  marrow signal intensity heterogeneous and diffusely decreased on T1 weighted imaging, most like related history of leukemia. Susceptibility artifact involving the left greater than right scalp, of uncertain etiology. Sinuses/Orbits: Globes and orbital soft tissues demonstrate no acute finding. Extensive opacity and mucosal thickening seen throughout the paranasal sinuses, with superimposed right maxillary sinus air-fluid level. Findings could be infectious and/or inflammatory nature. Trace bilateral mastoid effusions. Visualized inner ear structures grossly normal. Other: None. IMPRESSION: 1. Technically limited exam due to susceptibility artifact emanating from the scalp, of uncertain etiology. 2. Diffuse FLAIR hyperintensity throughout the cerebral sulci and surrounding the brainstem and cerebellum. While this finding could be artifactual in nature related to overlying susceptibility artifact, proteinaceous material and changes related to meningitis could also have this appearance. Correlation with LP and CSF studies recommended for correlated purposes as clinically warranted. No susceptibility artifact to suggest subarachnoid hemorrhage. 3. 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 4. Extensive pan sinusitis. This could be either infectious or inflammatory in nature. Electronically Signed   By: Pincus Badder.D.  On: 01/24/2020 06:46   US RENAL  Result Date: 01/29/2020 CLINICAL DATA:  Acute kidney injury EXAM: RENAL / URINARY TRACT ULTRASOUND COMPLETE COMPARISON:  None. FINDINGS: Right Kidney: Renal measurements: 11.1 x 6.2 x 6.6 cm = volume: Is 236 mL. There is mild pelviectasis without frank hydronephrosis. Left Kidney: Renal measurements: 12 x 6.5 x 6.3 cm = volume: 255 mL. There is mild pelviectasis without frank hydronephrosis. Bladder: Only the left ureteral jet was visualized. Other: None. IMPRESSION: 1. Mild  bilateral pelviectasis without evidence for frank hydronephrosis. 2. Only the left ureteral jet was visualized on today's exam. 3. No shadowing echogenic stones identified on this study. Electronically Signed   By: Constance Holster M.D.   On: 01/29/2020 16:46   IR Fluoro Guide Ndl Plmt / BX  Result Date: 01/27/2020 CLINICAL DATA:  Confusion, altered mental status EXAM: DIAGNOSTIC LUMBAR PUNCTURE UNDER FLUOROSCOPIC GUIDANCE FLUOROSCOPY TIME:  0.1 minute; 12  uGym2 DAP PROCEDURE: Informed consent was obtained from the family prior to the procedure, including potential complications of headache, allergy, and pain. With the patient prone, the lower back was prepped with Betadine. Intravenous Fentanyl 79mcg and Versed 1mg  were administered as conscious sedation during continuous monitoring of the patient's level of consciousness and physiological / cardiorespiratory status by the radiology RN, with a total moderate sedation time of 35 minutes. 1% Lidocaine was used for local anesthesia. Lumbar puncture was performed at the L3-4 level from a right parasagittal approach using a 20 gauge needle with return of yellow cloudy CSF with an opening pressure of 13 cm water. 9 ml of CSF were obtained for laboratory studies. The patient tolerated the procedure well and there were no apparent complications. IMPRESSION: 1. Technically successful lumbar puncture under fluoroscopy Electronically Signed   By: Lucrezia Europe M.D.   On: 01/27/2020 07:59   DG CHEST PORT 1 VIEW  Result Date: 01/27/2020 CLINICAL DATA:  Shortness of breath. History of leukemia. EXAM: PORTABLE CHEST 1 VIEW COMPARISON:  01/27/2020 FINDINGS: Midline trachea. Borderline cardiomegaly. Atherosclerosis in the transverse aorta. No pleural effusion or pneumothorax. Surgical clips in the left upper quadrant. No congestive failure. No lobar consolidation. IMPRESSION: Borderline cardiomegaly, without acute disease. Aortic Atherosclerosis (ICD10-I70.0).  Electronically Signed   By: Abigail Miyamoto M.D.   On: 01/27/2020 19:16   DG CHEST PORT 1 VIEW  Result Date: 01/27/2020 CLINICAL DATA:  SOB (shortness of breath) EXAM: PORTABLE CHEST - 1 VIEW COMPARISON:  the previous day's study FINDINGS: Relatively low lung volumes with resultant crowding of bronchovascular structures. The mild interstitial edema seen previously continues to improve. No new airspace disease. Heart size within normal limits for technique. Aortic Atherosclerosis (ICD10-170.0). No effusion. No pneumothorax. Cervical fixation hardware partially visualized. Surgical clips in the upper abdomen. IMPRESSION: Improving interstitial edema. Electronically Signed   By: Lucrezia Europe M.D.   On: 01/27/2020 11:55   DG CHEST PORT 1 VIEW  Result Date: 01/26/2020 CLINICAL DATA:  Leukocytosis EXAM: PORTABLE CHEST 1 VIEW COMPARISON:  01/24/2020 FINDINGS: The cardiac silhouette, mediastinal and hilar contours are stable. Low lung volumes with vascular crowding and streaky atelectasis. Slight improved lung aeration since the prior study with less vascular congestion and resolving edema. No pleural effusions. IMPRESSION: 1. Low lung volumes with vascular crowding and streaky atelectasis. 2. Improved lung aeration. Electronically Signed   By: Marijo Sanes M.D.   On: 01/26/2020 07:34   DG CHEST PORT 1 VIEW  Result Date: 01/24/2020 CLINICAL DATA:  Shortness of breath and hypoxia. EXAM: PORTABLE  CHEST 1 VIEW COMPARISON:  Radiograph yesterday. FINDINGS: Lower lung volumes from prior exam. Prominent heart size likely accentuated by technique. Bronchovascular crowding versus vascular congestion. No significant pleural effusion. No pneumothorax. No confluent airspace disease. No acute osseous abnormalities are seen. IMPRESSION: Lower lung volumes from prior exam. Bronchovascular crowding versus vascular congestion. Prominent heart size likely accentuated by technique and low lung volumes. Electronically Signed   By:  Keith Rake M.D.   On: 01/24/2020 19:19   DG Chest Port 1 View  Result Date: 01/23/2020 CLINICAL DATA:  69 year old female with sepsis. EXAM: PORTABLE CHEST 1 VIEW COMPARISON:  Chest radiographs 12/19/2018 and earlier. FINDINGS: Portable AP semi upright view at 2045 hours. Larger lung volumes. Cardiac size at the upper limits of normal. Calcified aortic atherosclerosis. Other mediastinal contours are within normal limits. Visualized tracheal air column is within normal limits. Allowing for portable technique the lungs are clear. Chronic left upper quadrant surgical clips and cervical ACDF. Negative visible bowel gas pattern. No acute osseous abnormality identified. IMPRESSION: No acute cardiopulmonary abnormality. Aortic Atherosclerosis (ICD10-I70.0). Electronically Signed   By: Genevie Ann M.D.   On: 01/23/2020 20:55   EEG adult  Result Date: 01/24/2020 Lora Havens, MD     01/24/2020 10:59 AM Patient Name: MAYDA HOUTMAN MRN: [id number] Epilepsy Attending: Lora Havens Referring Physician/Provider: Dr Amie [place] Date: 01/24/2020 Duration: 24.09 mins Patient history: 69 y.o.femalewith past medical history significant for leukemia, non-Hodgkin's lymphoma 2002 presented with altered mental status, fever, significantly elevated leukocytosis, with a rightward gaze preference. EEG to evaluate for status epilepticus. Level of alertness: lethargic AEDs during EEG study: Ativan, Keppra Technical aspects: This EEG study was done with scalp electrodes positioned according to the 10-20 International system of electrode placement. Electrical activity was acquired at a sampling rate of 500Hz  and reviewed with a high frequency filter of 70Hz  and a low frequency filter of 1Hz . EEG data were recorded continuously and digitally stored. DESCRIPTION: EEG showed continuous 3-4Hz  delta slowing in right hemisphere. There is also continuous 5-8Hz  theta-alpha activity in left hemisphere with intermittent 2-3hz   delta activity..  Hyperventilation and photic stimulation were not performed. ABNORMALITY - Continuous slow, generalized and lateralized right hemisphere IMPRESSION: This study is suggestive of cortical dysfunction in right hemisphere likely secondary to post-ictal state, underlying structural abnormality.  No seizures or definite epileptiform discharges were seen throughout the recording. Dr Rory Percy was notified. Priyanka Barbra Sarks   Overnight EEG with video  Result Date: 01/24/2020 Lora Havens, MD     01/25/2020 10:07 AM Patient Name: SCOTLYNN SORG MRN: [id number] Epilepsy Attending: Lora Havens Referring Physician/Provider: Dr Amie Portland Duration: 01/24/2020 [id number] to 01/25/2020 [id number]  Patient history: 69 y.o.femalewith past medical history significant for leukemia, non-Hodgkin's lymphoma 2002 presented with altered mental status, fever, significantly elevated leukocytosis, with a rightward gaze preference. EEG to evaluate for status epilepticus.  Level of alertness: lethargic  AEDs during EEG study: Ativan, Keppra  Technical aspects: This EEG study was done with scalp electrodes positioned according to the 10-20 International system of electrode placement. Electrical activity was acquired at a sampling rate of 500Hz  and reviewed with a high frequency filter of 70Hz  and a low frequency filter of 1Hz . EEG data were recorded continuously and digitally stored.  DESCRIPTION: EEG showed continuous 5-8Hz  theta-alpha activity in left hemisphere with intermittent 2-3hz  delta activity which at times appears quasi rhythmic. Sharp waves were also noted in left frontal region. Continuous 3-4Hz  low amplitude delta slowing was also  noted in right hemisphere. Hyperventilation and photic stimulation were not performed.  ABNORMALITY - Sharp waves, left frontotemporal region - Intermittent rhythmic delta activity, left frontal region - Continuous slow, generalized and lateralized right hemisphere  IMPRESSION:  This study is suggestive of epileptogenicity and cortical dysfunction in left frontotemporal region. Quasi rhythmic delta activity with sharp waves in left frontotemporal region can be on the ictal-interictal continuum. Additionally, there is evidence of cortical dysfunction in right hemisphere likely secondary to post-ictal state, underlying structural abnormality.  No definite seizures were seen throughout the recording. Lora Havens   ECHOCARDIOGRAM COMPLETE  Result Date: 01/30/2020    ECHOCARDIOGRAM REPORT   Patient Name:   LATROYA MANNIE Bjorn Date of Exam: 01/30/2020 Medical Rec #:  [id number]            Height:       62.6 in Accession #:    [id number]           Weight:       176.4 lb Date of Birth:  Mar 12, 1951             BSA:          1.826 m Patient Age:    48 years             BP:           127/49 mmHg Patient Gender: F                    HR:           99 bpm. Exam Location:  Inpatient Procedure: 2D Echo Indications:    Bacteremia 790.7 / R78.81  History:        Patient has no prior history of Echocardiogram examinations.                 Risk Factors:Hypertension. Acute kidney                 Sinus tachycardia                 Sepsis                 Meningitis.  Sonographer:    Vikki Ports Turrentine Referring Phys: [id number] Pill[place]  1. Normal LV systolic function; grade 1 diastolic dysfunction; very mild AS (mean gradient 11 mmHg); mild AI; moderate LAE.  2. Left ventricular ejection fraction, by estimation, is 50 to 55%. The left ventricle has low normal function. The left ventricle has no regional wall motion abnormalities. Left ventricular diastolic parameters are consistent with Grade I diastolic dysfunction (impaired relaxation). Elevated left atrial pressure.  3. Right ventricular systolic function is normal. The right ventricular size is normal.  4. Left atrial size was moderately dilated.  5. The mitral valve is normal in structure. Trivial mitral valve regurgitation. No evidence  of mitral stenosis.  6. The aortic valve is tricuspid. Aortic valve regurgitation is mild. Mild aortic valve stenosis.  7. The inferior vena cava is normal in size with greater than 50% respiratory variability, suggesting right atrial pressure of 3 mmHg. FINDINGS  Left Ventricle: Left ventricular ejection fraction, by estimation, is 50 to 55%. The left ventricle has low normal function. The left ventricle has no regional wall motion abnormalities. The left ventricular internal cavity size was normal in size. There is no left ventricular hypertrophy. Left ventricular diastolic parameters are consistent with Grade I diastolic dysfunction (impaired relaxation). Elevated left atrial pressure. Right Ventricle: The  right ventricular size is normal. Right ventricular systolic function is normal. Left Atrium: Left atrial size was moderately dilated. Right Atrium: Right atrial size was normal in size. Pericardium: Trivial pericardial effusion is present. Mitral Valve: The mitral valve is normal in structure. Normal mobility of the mitral valve leaflets. Mild mitral annular calcification. Trivial mitral valve regurgitation. No evidence of mitral valve stenosis. Tricuspid Valve: The tricuspid valve is normal in structure. Tricuspid valve regurgitation is trivial. No evidence of tricuspid stenosis. Aortic Valve: The aortic valve is tricuspid. Aortic valve regurgitation is mild. Aortic regurgitation PHT measures 349 msec. Mild aortic stenosis is present. Aortic valve mean gradient measures 11.3 mmHg. Aortic valve peak gradient measures 15.7 mmHg. Aortic valve area, by VTI measures 1.50 cm. Pulmonic Valve: The pulmonic valve was normal in structure. Pulmonic valve regurgitation is not visualized. No evidence of pulmonic stenosis. Aorta: The aortic root is normal in size and structure. Venous: The inferior vena cava is normal in size with greater than 50% respiratory variability, suggesting right atrial pressure of 3 mmHg.   Additional Comments: Normal LV systolic function; grade 1 diastolic dysfunction; very mild AS (mean gradient 11 mmHg); mild AI; moderate LAE.  LEFT VENTRICLE PLAX 2D LVIDd:         4.60 cm  Diastology LVIDs:         3.20 cm  LV e' lateral:   6.53 cm/s LV PW:         1.10 cm  LV E/e' lateral: 13.2 LV IVS:        1.10 cm  LV e' medial:    5.00 cm/s LVOT diam:     1.70 cm  LV E/e' medial:  17.2 LV SV:         51 LV SV Index:   28 LVOT Area:     2.27 cm  RIGHT VENTRICLE RV S prime:     22.00 cm/s TAPSE (M-mode): 2.9 cm LEFT ATRIUM             Index LA diam:        4.20 cm 2.30 cm/m LA Vol (A2C):   78.5 ml 43.00 ml/m LA Vol (A4C):   82.4 ml 45.14 ml/m LA Biplane Vol: 81.0 ml 44.37 ml/m  AORTIC VALVE AV Area (Vmax):    1.50 cm AV Area (Vmean):   1.30 cm AV Area (VTI):     1.50 cm AV Vmax:           198.00 cm/s AV Vmean:          159.667 cm/s AV VTI:            0.343 m AV Peak Grad:      15.7 mmHg AV Mean Grad:      11.3 mmHg LVOT Vmax:         131.00 cm/s LVOT Vmean:        91.700 cm/s LVOT VTI:          0.226 m LVOT/AV VTI ratio: 0.66 AI PHT:            349 msec  AORTA Ao Root diam: 3.20 cm MITRAL VALVE MV Area (PHT): 7.02 cm     SHUNTS MV Decel Time: 108 msec     Systemic VTI:  0.23 m MV E velocity: 85.90 cm/s   Systemic Diam: 1.70 cm MV A velocity: 139.00 cm/s MV E/A ratio:  0.62 Kirk Ruths MD Electronically signed by Kirk Ruths MD Signature Date/Time: 01/30/2020/3:37:01 PM    Final  Time Spent in minutes  30     Desiree Hane M.D on 01/31/2020 at 8:14 AM  To page go to [email or web address] - password [facility]

## 2020-01-31 NOTE — Consult Note (Signed)
[facility] for Infectious Disease    Date of Admission:  01/23/2020           Day 9 meropenem       Reason for Consult: Bacteremic pneumococcal meningitis    Referring Provider: Dr. Oretha Milch Primary Care Provider: Dr. Arlyss Repress  Assessment: She was admitted with severe bacteremic pneumococcal meningitis.  Her risk factors are prior splenectomy and pansinusitis.  Fortunately she is improving and does not appear to have any severe neurologic complications.  I favor continuing meropenem for 14 days given the severity of her infection and the presence of pansinusitis.  She has a known history of heart murmur and TTE shows mild aortic stenosis and regurgitation but no vegetations.  I do not think that we need to proceed with TEE.  She has had some recurrent fever.  This may be related to recent infiltration of an IV in her left arm.  Not find any clear evidence of thrombophlebitis.  If her fevers persist will repeat blood cultures.  Plan: 1. Continue meropenem for now  Principal Problem:   Pneumococcal meningitis Active Problems:   Sepsis ([facility])   Bacteremia due to Streptococcus pneumoniae   Essential hypertension   AKI (acute kidney injury) ([facility])   Dehydration with hypernatremia   Chronic pansinusitis   Interstitial lung disease ([facility])   GERD (gastroesophageal reflux disease)   History of chronic lymphocytic leukemia   Status post splenectomy   Unintentional weight loss   Scheduled Meds: . chlorhexidine  15 mL Mouth Rinse BID  . Chlorhexidine Gluconate Cloth  6 each Topical Daily  . enoxaparin (LOVENOX) injection  40 mg Subcutaneous Q24H  . feeding supplement (ENSURE ENLIVE)  237 mL Oral TID BM  . mouth rinse  15 mL Mouth Rinse q12n4p  . metoprolol tartrate  12.5 mg Oral BID  . multivitamin with minerals  1 tablet Oral Daily  . pantoprazole  40 mg Oral Daily  . sodium chloride flush  10-40 mL Intracatheter Q12H   Continuous Infusions: . sodium  chloride    . sodium chloride 10 mL/hr at 01/31/20 0534  . sodium chloride 75 mL/hr at 01/31/20 0847  . levETIRAcetam 750 mg (01/31/20 0859)  . meropenem (MERREM) IV 2 g (01/31/20 1117)   PRN Meds:.sodium chloride, sodium chloride, acetaminophen, haloperidol lactate, labetalol, levalbuterol, LORazepam, ondansetron **OR** ondansetron (ZOFRAN) IV, sodium chloride flush  HPI: Kathleen Duran is a 69 y.o. 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  She completed chemotherapy in early 2002 and has not had any evidence of progressive disease since that time.  Last year she did have new onset of unintentional weight loss and night sweats.  She also had some new cough.  CT scan revealed scattered groundglass opacities in both lungs.  She underwent bronchoscopy last November.  There has been concern that she has mild interstitial lung disease.  She has been referred to the ILD clinic at [facility]  She has also had pansinusitis for many years.  She underwent endoscopic sinus surgery last September.  She was treated with a 10-day course of prednisone by her ENT doctor in January of this year.   About a week and a half ago she began to become extremely fatigued.  She deteriorated fairly rapidly over the next 3 days.  She does not recall anything about her initial illness or early hospital stay.  Her  husband says that she developed fever, nausea, vomiting and progressive agitation and confusion leading to admission on 01/24/2020.  Her temperature was 101 degrees.  Her white blood cell count was 78,900.  She had nuchal rigidity.  A lumbar puncture was attempted but could not be completed.  She was started on broad empiric antimicrobial therapy with vancomycin, meropenem and acyclovir.  She has a history of hives with penicillin and cephalosporins.  Both admission blood cultures grew pneumococcus.  A lumbar puncture was  performed on 01/26/2020.  CSF analyses revealed 30,750 white blood cells of which 93% were segmented neutrophils.  Her protein was markedly elevated at 292 and her glucose was undetectable at less than 20.  No organisms were seen on Gram stain and CSF cultures were negative.  Her antibiotic therapy was narrowed to meropenem alone on 01/27/2020.  She remained confused and combative.  She attempted to leave [facility] but was involuntarily committed on 01/28/2020.  She is feeling better now.  She still has some headache and says that her memory is very poor.  She has only been out of bed to use the bedside commode.  She is not aware of any changes in her vision or hearing.   Review of Systems: Review of Systems  Constitutional: Positive for fever, malaise/fatigue and weight loss. Negative for chills and diaphoresis.  HENT: Positive for congestion. Negative for sore throat.   Eyes: Negative for blurred vision.  Respiratory: Negative for cough, sputum production and shortness of breath.   Cardiovascular: Negative for chest pain.  Gastrointestinal: Negative for abdominal pain, diarrhea, nausea and vomiting.  Genitourinary: Negative for dysuria.  Musculoskeletal: Negative for back pain and joint pain.  Skin: Negative for rash.  Neurological: Positive for speech change, weakness and headaches. Negative for focal weakness.       Her husband reports that she still has some slurred speech.  Psychiatric/Behavioral: Positive for memory loss.    Past Medical History:  Diagnosis Date  . Arthritis   . Cancer  [facility])    b cell leukemia  1999 , non hodkins lymphoma 2002  . GERD (gastroesophageal reflux disease)    borderline bleeding ulcer   . History of hiatal hernia   . Urticaria   . Vertigo     Social History   Tobacco Use  . Smoking status: Never Smoker  . Smokeless tobacco: Never Used  Substance Use Topics  . Alcohol use: Yes    Comment: rarely  . Drug use: No    History  reviewed. No pertinent family history. Allergies  Allergen Reactions  . Cephalosporins Other (See Comments)    ALLERGY to PCN with immediate rash, facial/tongue/throat swelling, SOB, lightheadedness with hypotension.  . Contrast Media [Iodinated Diagnostic Agents] Hives and Shortness Of Breath  . Penicillins Hives    Has patient had a PCN reaction causing immediate rash, facial/tongue/throat swelling, SOB or lightheadedness with hypotension: Yes Has patient had a PCN reaction causing severe rash involving mucus membranes or skin necrosis: No Has patient had a PCN reaction that required hospitalization No Has patient had a PCN reaction occurring within the last 10 years: No If all of the above answers are "NO", then may proceed with Cephalosporin use.  Marland Kitchen Amoxicillin Hives  . Codeine Nausea And Vomiting  . Keflex [Cephalexin] Hives  . Iodine Rash    OBJECTIVE: Blood pressure 135/76, pulse 98, temperature 98.6 F (37 C), temperature source Oral, resp. rate (!) 21, height 5' 2.64" (1.591 m), weight 87.1 kg,  SpO2 92 %.  Physical Exam Constitutional:      Comments: She is resting quietly in bed.  She appears weak and tired.  Her husband is at the bedside.  HENT:     Nose: No congestion.     Mouth/Throat:     Pharynx: No oropharyngeal exudate.  Eyes:     Conjunctiva/sclera: Conjunctivae normal.  Cardiovascular:     Rate and Rhythm: Normal rate and regular rhythm.     Heart sounds: Murmur present.     Comments: She has a 2/6 holosystolic murmur heard best at the right upper sternal border. Pulmonary:     Effort: Pulmonary effort is normal.     Breath sounds: Normal breath sounds.  Abdominal:     Palpations: Abdomen is soft.     Tenderness: There is no abdominal tenderness.     Comments: She is status post splenectomy.  Musculoskeletal:        General: No swelling or tenderness.     Cervical back: Neck supple.  Skin:    Findings: No rash.  Neurological:     General: No focal  deficit present.     Mental Status: She is alert.  Psychiatric:        Mood and Affect: Mood normal.     Lab Results Lab Results  Component Value Date   WBC 28.1 (H) 01/31/2020   HGB 10.9 (L) 01/31/2020   HCT 32.8 (L) 01/31/2020   MCV 81.6 01/31/2020   PLT 280 01/31/2020    Lab Results  Component Value Date   CREATININE 0.81 01/31/2020   BUN 22 01/31/2020   NA 141 01/31/2020   K 3.7 01/31/2020   CL 110 01/31/2020   CO2 21 (L) 01/31/2020    Lab Results  Component Value Date   ALT 42 01/28/2020   AST 19 01/28/2020   ALKPHOS 96 01/28/2020   BILITOT 1.0 01/28/2020     Microbiology: Recent Results (from the past 240 hour(s))  Blood Culture (routine x 2)     Status: Abnormal   Collection Time: 01/23/20  8:29 PM   Specimen: BLOOD LEFT HAND  Result Value Ref Range Status   Specimen Description BLOOD LEFT HAND  Final   Special Requests   Final    BOTTLES DRAWN AEROBIC AND ANAEROBIC Blood Culture adequate volume   Culture  Setup Time   Final    GRAM POSITIVE COCCI IN PAIRS IN BOTH AEROBIC AND ANAEROBIC BOTTLES CRITICAL RESULT CALLED TO, READ BACK BY AND VERIFIED WITH: T. BAUMEISTER, PHARMD AT 1120 ON 01/24/20 BY C. JESSUP, MT.    Culture (A)  Final    STREPTOCOCCUS PNEUMONIAE SUSCEPTIBILITIES PERFORMED ON PREVIOUS CULTURE WITHIN THE LAST 5 DAYS. Performed at [facility], [place] [address], [place], [place] [address]    Report Status 01/26/2020 FINAL  Final  Blood Culture (routine x 2)     Status: Abnormal   Collection Time: 01/23/20  8:55 PM   Specimen: BLOOD RIGHT HAND  Result Value Ref Range Status   Specimen Description BLOOD RIGHT HAND  Final   Special Requests   Final    BOTTLES DRAWN AEROBIC AND ANAEROBIC Blood Culture results may not be optimal due to an inadequate volume of blood received in culture bottles   Culture  Setup Time   Final    GRAM POSITIVE COCCI IN PAIRS IN BOTH AEROBIC AND ANAEROBIC BOTTLES CRITICAL RESULT CALLED TO, READ BACK BY AND  VERIFIED WITH: T. Janeice Robinson, PHARMD AT 1120  ON 01/24/20 BY C. JESSUP, MT. Performed at [facility], [place] [address] [place] [address] [place], [place] [address]    Culture STREPTOCOCCUS PNEUMONIAE (A)  Final   Report Status 01/26/2020 FINAL  Final   Organism ID, Bacteria STREPTOCOCCUS PNEUMONIAE  Final      Susceptibility   Streptococcus pneumoniae - MIC*    ERYTHROMYCIN <=0.12 SENSITIVE Sensitive     LEVOFLOXACIN 0.5 SENSITIVE Sensitive     VANCOMYCIN 0.25 SENSITIVE Sensitive     PENICILLIN (meningitis) 0.12 RESISTANT Resistant     PENO - penicillin 0.12      PENICILLIN (non-meningitis) 0.12 SENSITIVE Sensitive     PENICILLIN (oral) 0.12 INTERMEDIATE Intermediate     CEFTRIAXONE (non-meningitis) <=0.12 SENSITIVE Sensitive     CEFTRIAXONE (meningitis) <=0.12 SENSITIVE Sensitive     * STREPTOCOCCUS PNEUMONIAE  Blood Culture ID Panel (Reflexed)     Status: Abnormal   Collection Time: 01/23/20  8:55 PM  Result Value Ref Range Status   Enterococcus species NOT DETECTED NOT DETECTED Final   Listeria monocytogenes NOT DETECTED NOT DETECTED Final   Staphylococcus species NOT DETECTED NOT DETECTED Final   Staphylococcus aureus (BCID) NOT DETECTED NOT DETECTED Final   Streptococcus species DETECTED (A) NOT DETECTED Final    Comment: CRITICAL RESULT CALLED TO, READ BACK BY AND VERIFIED WITH: T. BAUMEISTER, PHARMD AT 1120 ON 01/24/20 BY C. JESSUP, MT.    Streptococcus agalactiae NOT DETECTED NOT DETECTED Final   Streptococcus pneumoniae DETECTED (A) NOT DETECTED Final    Comment: CRITICAL RESULT CALLED TO, READ BACK BY AND VERIFIED WITH: T. BAUMEISTER, PHARMD AT 1120 ON 01/24/20 BY C. JESSUP, MT.    Streptococcus pyogenes NOT DETECTED NOT DETECTED Final   Acinetobacter baumannii NOT DETECTED NOT DETECTED Final   Enterobacteriaceae species NOT DETECTED NOT DETECTED Final   Enterobacter cloacae complex NOT DETECTED NOT DETECTED Final   Escherichia coli NOT DETECTED NOT DETECTED Final   Klebsiella  oxytoca NOT DETECTED NOT DETECTED Final   Klebsiella pneumoniae NOT DETECTED NOT DETECTED Final   Proteus species NOT DETECTED NOT DETECTED Final   Serratia marcescens NOT DETECTED NOT DETECTED Final   Haemophilus influenzae NOT DETECTED NOT DETECTED Final   Neisseria meningitidis NOT DETECTED NOT DETECTED Final   Pseudomonas aeruginosa NOT DETECTED NOT DETECTED Final   Candida albicans NOT DETECTED NOT DETECTED Final   Candida glabrata NOT DETECTED NOT DETECTED Final   Candida krusei NOT DETECTED NOT DETECTED Final   Candida parapsilosis NOT DETECTED NOT DETECTED Final   Candida tropicalis NOT DETECTED NOT DETECTED Final    Comment: Performed at [facility], [place]. [address], [place], [place] [address]  Urine culture     Status: None   Collection Time: 01/23/20  9:17 PM   Specimen: In/Out Cath Urine  Result Value Ref Range Status   Specimen Description IN/OUT CATH URINE  Final   Special Requests NONE  Final   Culture   Final    NO GROWTH Performed at [facility], [place] [address] [place], [place] [address]    Report Status 01/25/2020 FINAL  Final  SARS CORONAVIRUS 2 (TAT 6-24 HRS) Nasopharyngeal Nasopharyngeal Swab     Status: None   Collection Time: 01/23/20 11:06 PM   Specimen: Nasopharyngeal Swab  Result Value Ref Range Status   SARS Coronavirus 2 NEGATIVE NEGATIVE Final    Comment: (NOTE) SARS-CoV-2 target nucleic acids are NOT DETECTED. The SARS-CoV-2 RNA is generally detectable in upper and lower respiratory specimens during  the acute phase of infection. Negative results do not preclude SARS-CoV-2 infection, do not rule out co-infections with other pathogens, and should not be used as the sole basis for treatment or other patient management decisions. Negative results must be combined with clinical observations, patient history, and epidemiological information. The expected result is Negative. Fact Sheet for  Patients: SugarRoll.be Fact Sheet for Healthcare Providers: https://www.woods-mathews.com/ This test is not yet approved or cleared by the [place] FDA and  has been authorized for detection and/or diagnosis of SARS-CoV-2 by FDA under an Emergency Use Authorization (EUA). This EUA will remain  in effect (meaning this test can be used) for the duration of the COVID-19 declaration under Section 56 4(b)(1) of the Act, 21 U.S.C. section 360bbb-3(b)(1), unless the authorization is terminated or revoked sooner. Performed at [facility], [place] [address], [place], [place] [address]   CSF culture     Status: None   Collection Time: 01/26/20  5:37 PM   Specimen: PATH Cytology CSF; Cerebrospinal Fluid  Result Value Ref Range Status   Specimen Description CSF  Final   Special Requests NONE  Final   Gram Stain   Final    WBC PRESENT, PREDOMINANTLY PMN NO ORGANISMS SEEN CYTOSPIN SMEAR    Culture   Final    NO GROWTH 3 DAYS Performed at [facility], [place] [address], [place], [place] [address]    Report Status 01/30/2020 FINAL  Final  Culture, fungus without smear     Status: None (Preliminary result)   Collection Time: 01/26/20  5:37 PM   Specimen: CSF  Result Value Ref Range Status   Specimen Description CSF  Final   Special Requests NONE  Final   Culture   Final    NO FUNGUS ISOLATED AFTER 5 DAYS Performed at [facility], [place] [address] [place], [place] [address]    Report Status PENDING  Incomplete  Culture, blood (Routine X 2) w Reflex to ID Panel     Status: None (Preliminary result)   Collection Time: 01/29/20  3:11 PM   Specimen: BLOOD  Result Value Ref Range Status   Specimen Description BLOOD RIGHT ANTECUBITAL  Final   Special Requests   Final    BOTTLES DRAWN AEROBIC AND ANAEROBIC Blood Culture adequate volume   Culture   Final    NO GROWTH < 24 HOURS Performed at [facility], [place] [address],  [place], [place] [address]    Report Status PENDING  Incomplete  Culture, blood (Routine X 2) w Reflex to ID Panel     Status: None (Preliminary result)   Collection Time: 01/29/20  3:19 PM   Specimen: BLOOD RIGHT HAND  Result Value Ref Range Status   Specimen Description BLOOD RIGHT HAND  Final   Special Requests IN PEDIATRIC BOTTLE Blood Culture adequate volume  Final   Culture   Final    NO GROWTH < 24 HOURS Performed at [facility], [place] [address], [place], [place] [address]    Report Status PENDING  Incomplete    Michel Bickers, MD [facility] for Infectious [place] [phone] pager   [phone] cell 01/31/2020, 2:45 PM

## 2020-02-01 LAB — BASIC METABOLIC PANEL
Anion gap: 10 (ref 5–15)
BUN: 16 mg/dL (ref 8–23)
CO2: 21 mmol/L — ABNORMAL LOW (ref 22–32)
Calcium: 8.1 mg/dL — ABNORMAL LOW (ref 8.9–10.3)
Chloride: 108 mmol/L (ref 98–111)
Creatinine, Ser: 0.8 mg/dL (ref 0.44–1.00)
GFR calc Af Amer: >60 mL/min (ref >60)
GFR calc non Af Amer: >60 mL/min (ref >60)
Glucose, Bld: 92 mg/dL (ref 70–99)
Potassium: 3.6 mmol/L (ref 3.5–5.1)
Sodium: 139 mmol/L (ref 135–145)

## 2020-02-01 LAB — CBC
HCT: 31.6 % — ABNORMAL LOW (ref 36.0–46.0)
Hemoglobin: 10.4 g/dL — ABNORMAL LOW (ref 12.0–15.0)
MCH: 26.8 pg (ref 26.0–34.0)
MCHC: 32.9 g/dL (ref 30.0–36.0)
MCV: 81.4 fL (ref 80.0–100.0)
Platelets: 340 10*3/uL (ref 150–400)
RBC: 3.88 MIL/uL (ref 3.87–5.11)
RDW: 16.2 % — ABNORMAL HIGH (ref 11.5–15.5)
WBC: 24.8 10*3/uL — ABNORMAL HIGH (ref 4.0–10.5)
nRBC: 0 % (ref 0.0–0.2)

## 2020-02-01 LAB — GLUCOSE, CAPILLARY
Glucose-Capillary: 84 mg/dL (ref 70–99)
Glucose-Capillary: 92 mg/dL (ref 70–99)

## 2020-02-01 MED ORDER — SODIUM CHLORIDE 0.9 % IV SOLN: INTRAVENOUS | Status: AC

## 2020-02-01 MED ORDER — METOPROLOL TARTRATE 25 MG PO TABS
25.0000 mg | ORAL_TABLET | Freq: Two times a day (BID) | ORAL | Status: DC
  Administered 2020-02-01 – 2020-02-03 (×4): 25 mg via ORAL
  Filled 2020-02-01 (×4): qty 1

## 2020-02-01 MED ORDER — LEVETIRACETAM 750 MG PO TABS
750.0000 mg | ORAL_TABLET | Freq: Two times a day (BID) | ORAL | Status: DC
  Administered 2020-02-01 – 2020-02-05 (×8): 750 mg via ORAL
  Filled 2020-02-01 (×8): qty 1

## 2020-02-01 MED ORDER — METOPROLOL TARTRATE 12.5 MG HALF TABLET
12.5000 mg | ORAL_TABLET | Freq: Once | ORAL | Status: AC
  Administered 2020-02-01: 12.5 mg via ORAL
  Filled 2020-02-01: qty 1

## 2020-02-01 NOTE — Progress Notes (Addendum)
Pt resting comfortably in bed with call light within reach and sitter remains at bedside. After implementation of MD orders, Pt HR down to the 90's and NSR on the monitor after. Will report off to oncoming RN. Delia Heady RN

## 2020-02-01 NOTE — Progress Notes (Signed)
Patient ID: Kathleen Duran, female   DOB: [birth year], 69 y.o.   MRN: [id number]          [facility] for Infectious Disease    Date of Admission:  01/23/2020   Day 10 meropenem         She is sleeping soundly.  I did not awaken her.  Her husband told me that she was doing better today.  He is concerned that she is still weak in her left arm but said that she was able to walk in her room today.  Plan on 4 more days of meropenem for her severe bacteremic pneumococcal meningoencephalitis and pansinusitis.         Michel Bickers, MD [facility] for [facility] [phone] pager   [phone] cell 02/01/2020, 6:02 PM

## 2020-02-01 NOTE — Progress Notes (Signed)
MD ordered to continue monitoring pt. Pt sleeping with sitter at bedside. Will continue to closely monitor. Delia Heady RN

## 2020-02-01 NOTE — Consult Note (Signed)
Physical Medicine and Rehabilitation Consult Reason for Consult: Altered mental status with decreased functional mobility Referring Physician: [facility]   HPI: Kathleen Duran is a 69 y.o. right-handed female with history of promyelocytic leukemia under remission followed at [facility], hypertension, ACDF 2016.  Per chart review lives with spouse independent prior to admission.  1 level home 6 steps to entry.  Presented 01/23/2020 with altered mental status, fever of 101 Fahrenheit, lactic acid 1.6, BUN 53, creatinine 3.09, WBC 78,900, blood culture Streptococcus Pneumouiae.  MRI of the brain showed diffuse FLAIR hyperintensity throughout the cerebral foci of surrounding the brainstem and cerebellum.  Moderate cerebral white matter disease.  EEG negative for seizure.  Chest x-ray negative.  Renal ultrasound with no hydronephrosis.  Underwent lumbar puncture which showed markedly elevated WBC of greater than 30,000 predominant neutrophilic, elevated protein 292 and depressed glucose consistent with bacterial meningitis.  Infectious disease consulted and currently maintained on intravenous Merrem x14 days.  Maintained on Keppra for seizure prophylaxis.  Subcutaneous Lovenox for DVT prophylaxis.  Renal function improved with gentle IV fluids latest creatinine 0.80.  Tolerating a regular diet.  Therapy evaluations completed with recommendations of physical medicine rehab consult.   Review of Systems  Constitutional: Negative for fever.  HENT: Negative for hearing loss.   Eyes: Negative for blurred vision and double vision.  Respiratory: Negative for cough and shortness of breath.   Cardiovascular: Negative for chest pain and palpitations.  Gastrointestinal: Positive for constipation. Negative for heartburn and vomiting.       GERD  Genitourinary: Negative for dysuria, flank pain and hematuria.  Musculoskeletal: Positive for myalgias.  Skin: Negative for rash.  Neurological:   Vertigo  All other systems reviewed and are negative.  Past Medical History:  Diagnosis Date  . Arthritis   . Cancer [facility])    b cell leukemia  1999 , non hodkins lymphoma 2002  . GERD (gastroesophageal reflux disease)    borderline bleeding ulcer   . History of hiatal hernia   . Urticaria   . Vertigo    Past Surgical History:  Procedure Laterality Date  . ANTERIOR CERVICAL DECOMP/DISCECTOMY FUSION N/A 10/14/2015   Procedure: ANTERIOR CERVICAL DECOMPRESSION/DISCECTOMY FUSION 1 LEVEL;  Surgeon: Phylliss Bob, MD;  Location: [place];  Service: Orthopedics;  Laterality: N/A;  Anterior cervical decompression fusion, cerivcal 4-5 with instrumentation and allograft  . CHOLECYSTECTOMY     2006  . HERNIA REPAIR     umbilical [id number]  . IR FLUORO GUIDED NEEDLE PLC ASPIRATION/INJECTION LOC  01/26/2020  . SHOULDER ARTHROSCOPY WITH BICEPSTENOTOMY Right 04/17/2016   Procedure: SHOULDER ARTHROSCOPY WITH BICEPSTENOTOMY;  Surgeon: Tania Ade, MD;  Location: [place];  Service: Orthopedics;  Laterality: Right;  . SHOULDER ARTHROSCOPY WITH ROTATOR CUFF REPAIR AND SUBACROMIAL DECOMPRESSION Right 04/17/2016   Procedure: SHOULDER ARTHROSCOPY ROTATOR CUFF TEAR AND SUBACROMIAL DECOMPRESSION AND BICEPS TENOTOMY;  Surgeon: Tania Ade, MD;  Location: [place];  Service: Orthopedics;  Laterality: Right;  RIGHT SHOULDER ARTHROSCOPY ROTATOR CUFF TEAR AND SUBACROMIAL DECOMPRESSION  . spleenectomy 1999    . strangulated umbilical hernia [id number]    . TONSILLECTOMY     left tonsil   History reviewed. No pertinent family history. Social History:  reports that she has never smoked. She has never used smokeless tobacco. She reports current alcohol use. She reports that she does not use drugs. Allergies:  Allergies  Allergen Reactions  . Cephalosporins Other (See Comments)    ALLERGY  to PCN with immediate rash, facial/tongue/throat swelling, SOB, lightheadedness with hypotension.  .  Contrast Media [Iodinated Diagnostic Agents] Hives and Shortness Of Breath  . Penicillins Hives    Has patient had a PCN reaction causing immediate rash, facial/tongue/throat swelling, SOB or lightheadedness with hypotension: Yes Has patient had a PCN reaction causing severe rash involving mucus membranes or skin necrosis: No Has patient had a PCN reaction that required hospitalization No Has patient had a PCN reaction occurring within the last 10 years: No If all of the above answers are "NO", then may proceed with Cephalosporin use.  Marland Kitchen Amoxicillin Hives  . Codeine Nausea And Vomiting  . Keflex [Cephalexin] Hives  . Iodine Rash   Medications Prior to Admission  Medication Sig Dispense Refill  . aspirin 81 MG tablet Take 81 mg by mouth daily.    Marland Kitchen atorvastatin (LIPITOR) 40 MG tablet Take 40 mg by mouth at bedtime.    . Biotin (BIOTIN MAXIMUM STRENGTH) 10 MG TABS Take 10 mg by mouth daily.    . Calcium Carb-Cholecalciferol (CALCIUM 600 + D PO) Take 1 tablet by mouth daily.    . Calcium Polycarbophil (FIBER-CAPS PO) Take 1 capsule by mouth as needed (mild constipation).    . clobetasol cream (TEMOVATE) [id number] % Apply 1 application topically daily as needed (rash).    . Fluocinonide 0.1 % CREA Apply 1 application topically as needed. For irritation    . fluticasone (FLONASE) 50 MCG/ACT nasal spray Place 2 sprays into both nostrils daily. 16 g 5  . ipratropium (ATROVENT) 0.06 % nasal spray Place 2 sprays into the nose daily.    . irbesartan (AVAPRO) 300 MG tablet Take 300 mg by mouth at bedtime.    . metoprolol (LOPRESSOR) 50 MG tablet Take 50 mg by mouth daily.    . Multiple Vitamin (MULTIVITAMIN WITH MINERALS) TABS tablet Take 1 tablet by mouth daily.    . mupirocin ointment (BACTROBAN) 2 % Apply 1 application topically as directed. Daily in nose    . omeprazole-sodium bicarbonate (ZEGERID) 40-1100 MG capsule Take 1 capsule by mouth daily before breakfast.    . Potassium 99 MG TABS Take 99 mg  by mouth daily.    . vitamin C (ASCORBIC ACID) 500 MG tablet Take 500 mg by mouth daily.     . vitamin E 400 UNIT capsule Take 400 Units by mouth daily.    Marland Kitchen azelastine (ASTELIN) 0.1 % nasal spray Place 2 sprays into both nostrils 2 (two) times daily. (Patient not taking: Reported on 01/24/2020) 30 mL 1  . Carbinoxamine Maleate 4 MG TABS Take 1 tablet (4 mg total) by mouth every 8 (eight) hours as needed. (Patient not taking: Reported on 01/24/2020) 56 each 1  . docusate sodium (COLACE) 100 MG capsule Take 1 capsule (100 mg total) by mouth 3 (three) times daily as needed. (Patient not taking: Reported on 04/22/2018) 20 capsule 0  . minoxidil (ROGAINE) 2 % external solution Apply 1 mL topically 1 day or 1 dose.    . oxyCODONE-acetaminophen (ROXICET) 5-325 MG tablet Take 1-2 tablets by mouth every 4 (four) hours as needed for severe pain. (Patient not taking: Reported on 04/22/2018) 60 tablet 0  . predniSONE (DELTASONE) 10 MG tablet 2 tablets daily for 4 days then 1 tablet on day 5 then STOP! (Patient not taking: Reported on 01/24/2020) 9 tablet 0  . ranitidine (ZANTAC) 150 MG tablet Take 1 tablet (150 mg total) by mouth at bedtime. (Patient not taking: Reported  on 01/24/2020) 60 tablet 1    Home: [place] expects to be discharged to:: Private residence Living Arrangements: Spouse/significant other Available Help at Discharge: Family, Available PRN/intermittently Type of Home: House Home Access: Stairs to enter [employer] of Steps: 6 Entrance Stairs-Rails: Right Home Layout: One level Bathroom Shower/Tub: Chiropodist: Standard Home Equipment: None  Lives With: Spouse  Functional History: Prior Function Level of Independence: Independent Comments: Drives, does grocery shopping, cooking, cleaning. Functional Status:  Mobility: Bed Mobility Overal bed mobility: Needs Assistance Bed Mobility: Rolling, Sidelying to Sit Rolling: Min  assist Sidelying to sit: Mod assist, HOB elevated General bed mobility comments: Assist with LEs, bottom and trunk to get to EOB. Able to reach for rail with RUE. Increased time and cues, not able to assist with LUE. Transfers Overall transfer level: Needs assistance Equipment used: Rolling walker (2 wheeled) Transfers: Sit to/from Stand Sit to Stand: Mod assist General transfer comment: Assist to power to standing with use of momentum and cues for hand placement. Transferred to chair post ambulation. Ambulation/Gait Ambulation/Gait assistance: Min assist Gait Distance (Feet): 12 Feet Assistive device: Rolling walker (2 wheeled) Gait Pattern/deviations: Step-through pattern, Decreased stride length, Trunk flexed General Gait Details: Slow, unsteady gait with decreased eccentric control of LEs and increased WB through BUEs. Bil knee instability but no buckling. Gait velocity: decreased    ADL:    Cognition: Cognition Overall Cognitive Status: No family/caregiver present to determine baseline cognitive functioning Arousal/Alertness: Awake/alert Orientation Level: Oriented X4 Attention: Focused, Sustained Focused Attention: Impaired Focused Attention Impairment: Verbal complex Sustained Attention: Impaired Sustained Attention Impairment: Verbal complex Memory: Impaired Memory Impairment: Storage deficit, Retrieval deficit, Decreased recall of new information(Immediate: 3/5; delayed: 2/5) Awareness: Impaired Awareness Impairment: Intellectual impairment Problem Solving: Impaired Problem Solving Impairment: Verbal basic, Functional basic Executive Function: Reasoning Reasoning: Impaired Reasoning Impairment: Verbal complex Cognition Arousal/Alertness: Awake/alert Behavior During Therapy: WFL for tasks assessed/performed Overall Cognitive Status: No family/caregiver present to determine baseline cognitive functioning Area of Impairment: Memory, Problem solving Memory: Decreased  short-term memory Problem Solving: Decreased initiation, Requires verbal cues General Comments: A&Ox4 today; impaired memory esp preceding days before admission. WFL for basic mobility tasks. needs further assessment  Blood pressure 138/82, pulse (!) 102, temperature 99 F (37.2 C), temperature source Oral, resp. rate 18, height 5' 2.64" (1.591 m), weight 83.1 kg, SpO2 98 %.  Physical Exam  General: Alert and oriented x 3, No apparent distress HEENT: Head is normocephalic, atraumatic, PERRLA, EOMI, sclera anicteric, oral mucosa pink and moist, dentition intact, ext ear canals clear,  Neck: Supple without JVD or lymphadenopathy Heart: Reg rate and rhythm. No murmurs rubs or gallops Chest: CTA bilaterally without wheezes, rales, or rhonchi; no distress Abdomen: Soft, non-tender, non-distended, bowel sounds positive. Extremities: No clubbing, cyanosis, or edema. Pulses are 2+ Skin: 1+edema bilateral lower extremities with rash of RLE.  Neuro: Patient is alert in no acute distress.  Follows commands.  Makes good eye contact with examiner.  Provides name and age.  She did have some difficulty with overall recall of hospital course.  Musculoskeletal: 5/5 throughout with the exception of LUE which has 4/5 strength proximally and 4-/5 strength distally.  Psych: Pt's affect is appropriate. Pt is cooperative  Results for orders placed or performed during the hospital encounter of 01/23/20 (from the past 24 hour(s))  Glucose, capillary     Status: None   Collection Time: 01/31/20  6:24 PM  Result Value Ref Range   Glucose-Capillary 89 70 -  99 mg/dL  Glucose, capillary     Status: None   Collection Time: 02/01/20 12:37 AM  Result Value Ref Range   Glucose-Capillary 92 70 - 99 mg/dL  CBC     Status: Abnormal   Collection Time: 02/01/20  5:15 AM  Result Value Ref Range   WBC 24.8 (H) 4.0 - 10.5 K/uL   RBC 3.88 3.87 - 5.11 MIL/uL   Hemoglobin 10.4 (L) 12.0 - 15.0 g/dL   HCT 31.6 (L) 36.0 - 46.0 %    MCV 81.4 80.0 - 100.0 fL   MCH 26.8 26.0 - 34.0 pg   MCHC 32.9 30.0 - 36.0 g/dL   RDW 16.2 (H) 11.5 - 15.5 %   Platelets 340 150 - 400 K/uL   nRBC 0.0 0.0 - 0.2 %  Basic metabolic panel     Status: Abnormal   Collection Time: 02/01/20  5:15 AM  Result Value Ref Range   Sodium 139 135 - 145 mmol/L   Potassium 3.6 3.5 - 5.1 mmol/L   Chloride 108 98 - 111 mmol/L   CO2 21 (L) 22 - 32 mmol/L   Glucose, Bld 92 70 - 99 mg/dL   BUN 16 8 - 23 mg/dL   Creatinine, Ser 0.80 0.44 - 1.00 mg/dL   Calcium 8.1 (L) 8.9 - 10.3 mg/dL   GFR calc non Af Amer >60 >60 mL/min   GFR calc Af Amer >60 >60 mL/min   Anion gap 10 5 - 15  Glucose, capillary     Status: None   Collection Time: 02/01/20  5:36 AM  Result Value Ref Range   Glucose-Capillary 84 70 - 99 mg/dL   No results found.   Assessment/Plan: Diagnosis: Bacteremic pneumococcal meningitis 1. Does the need for close, 24 hr/day medical supervision in concert with the patient's rehab needs make it unreasonable for this patient to be served in a less intensive setting? Yes 2. Co-Morbidities requiring supervision/potential complications: B cell prolymphocytic leukemia, s/p splenectomy 1999, vertigo, LUE weakness and pain, fatigue, impaired mobility and ADLs 3. Due to bladder management, bowel management, safety, skin/wound care, disease management, medication administration, pain management and patient education, does the patient require 24 hr/day rehab nursing? Yes 4. Does the patient require coordinated care of a physician, rehab nurse, therapy disciplines of PT, OT to address physical and functional deficits in the context of the above medical diagnosis(es)? Yes Addressing deficits in the following areas: balance, endurance, locomotion, strength, transferring, bowel/bladder control, bathing, dressing, feeding, grooming, toileting and psychosocial support 5. Can the patient actively participate in an intensive therapy program of at least 3 hrs of  therapy per day at least 5 days per week? Yes 6. The potential for patient to make measurable gains while on inpatient rehab is excellent 7. Anticipated functional outcomes upon discharge from inpatient rehab are modified independent  with PT, modified independent with OT, independent with SLP. 8. Estimated rehab length of stay to reach the above functional goals is: 10-14 days 9. Anticipated discharge destination: Home 10. Overall Rehab/Functional Prognosis: excellent  RECOMMENDATIONS: This patient's condition is appropriate for continued rehabilitative care in the following setting: CIR Patient has agreed to participate in recommended program. Yes Note that insurance prior authorization may be required for reimbursement for recommended care.  Comment: Kathleen Duran would be an excellent CIR candidate and will have the support of her husband at home. For her left shoulder pain, she may benefit from diclofenac gel. For her coccyx pain, she may benefit from a  heating pad and lidocaine patch. Thank you for this consult. We will continue to follow in Kathleen Duran's care.   Lavon Paganini Angiulli, PA-C 02/01/2020   I have personally performed a face to face diagnostic evaluation, including, but not limited to relevant history and physical exam findings, of this patient and developed relevant assessment and plan.  Additionally, I have reviewed and concur with the physician assistant's documentation above.  Leeroy Cha, MD

## 2020-02-01 NOTE — Progress Notes (Signed)
[facility]  PROGRESS NOTE  Kathleen Duran DOB: 05/02/1951 DOA: 01/23/2020 PCP: Arlyss Repress, MD Admit date - 01/23/2020   Admitting Physician Kerney Elbe, DO  Outpatient Primary MD for the patient is Arlyss Repress, MD  LOS - 9 Brief Narrative   Kathleen Duran is a 69 y.o. year old female with medical history significant forpromyelocytic leukemia under remission, hypertension, GERD  who presented on 01/23/2020 with fever, sudden confusion x 24 hours and vomiting and was found to have nuchal rigidity, leukocytosis of 70,000, and creatinine of 3.09 (normal in October) and admitted with working diagnosis of sepsis secondary to presumed meningitis.  Underwent unsuccessful LP in ED and started on empiric antibiotics for meningitis.  Hospital course complicated by altered mental status concern for meningitis based on MRI brain imaging.  Patient underwent LP on 3/29 which showed markedly elevated WBC of greater than 30,000 predominant neutrophilic, elevated protein of 292, and depressed glucose consistent with bacterial meningitis.HSV1/2 negative. DC'd vancomycin, ampicillin, and acyclovir on 3/30 given likely etiology is strep pna bacteremia presumed from pansinusitis. ID consulted an agreed  Subjective  Today she feels ok. No cough, no chest pain, no SOB  A & P   Altered mental status secondary to meningitis, likely strep pneumo given concomitant S.pna bacteremia, resolved Mental status back to baseline still having neck pain and headache but no focal deficits.  H.  Remains afebrile, based off of LP likely bacterial, given strep pneumo in blood this is likely etiology -Currently on meropenem,  -Monitor CSF cultures currently pending with no growth -Patient IVC'd 3/31, does not have competency to leave AGAINST MEDICAL ADVICE  Sepsis secondary to Strep pneumo bacteremia and Strep pna meningitis/encephalitis and AKI,  All stemming from meningitis related to  strep pneumo/bacteremia in setting of splenectomy.  Question if sinusitis was nidus for meningitis based on CT head imaging. Afebrile for last 24 hours, repeat blood cultures seem to be showing clearance, TTE with no vegetations, no other obvious signs of infection. -Appreciate ID recs, continue meropenem for total of 14 days given severity of her infection and presence of pansinusitis -Daily CBC -Monitor repeat blood cultures to document clearance  Sinus tachycardia. Has LBBB on admission. Repeat EKG shows the same. Remains asymptomatic Is net negative on admission, tolerating po intake but not much, looks somewhat volume depleted on exam. No longer septic -Continue IV fluids, encourage p.o. intake --resume home Beta blocaked -Monitor on telemetry  Elevated LFTs, acute liver injury in the setting of sepsis, resolved Peak ALT of 240, and AST 161.  Now back to normal baseline.  Discrete skin discoloration on left leg, that extends to upper abdomen, stable Husband reports this is a birthmark at bedside.  -Closely monitor  LTM concerning for epileptogenicity and cortical dysfunction in left frontotemporal region No definite seizures seen on LTM EEG recording -neurology recommends continuing Keppra 750 mg twice daily,   Hypernatremia, in the setting of diminished p.o. intake, resolved -Currently on D5 half-normal/expect improvement now that patient able to tolerate diet (dysphagia 2 diet) -Repeat BMP in a.m.  AKI, resolved. Multifactorial etiology: contrast/diminished p.o. intake/urinary retention/briefly on vancomycin. Peak Cr  3.91 on 4/1 now back to baseline. Responded well to IVF  -Avoid nephrotoxins (vancomycin has been DC'd) -foley in place -RUS showed mild bilateral pelviectasis without hydronephrosis,  monitor output  -Daily BMP -Holding home irbesartan  HTN, at goal -on lopressor, increase to 25 mg BID given sinus tachycardia (had reduced her from her home regimen  on  admission) -Hold home irbesartan given AKI  GERD, stable -Continue home PPI  History of B-cell prolymphocytic leukemia, status post splenectomy and chemotherapy Evaluated by oncology this admission, per their review peripheral smear seems more reactive, doubt relapse, likely all related to meningitis above.  Given splenectomy at high risk for encapsulated organism infections    Family Communication  : Husband updated on phone on 01/29/2020 at [phone]. Updated at bedside on 4/4  Code Status : Full  Disposition Plan  :  Patient is from home. Anticipated d/c date:  3-4 days. Barriers to d/c or necessity for inpatient status:  Continue IV meropenem for bacterial meningitis to continue for total of 14 days , close monitoring mental status, needs stabilization of sinus tachycardia. Likely dc to CIR Consults  : Neurology, oncology, ID  Procedures  : 3/29, LP under fluoroscopy  DVT Prophylaxis  :  Lovenox  Lab Results  Component Value Date   PLT 340 02/01/2020    Diet :  Diet Order            Diet regular Room service appropriate? Yes with Assist; Fluid consistency: Thin  Diet effective now               Inpatient Medications Scheduled Meds: . chlorhexidine  15 mL Mouth Rinse BID  . Chlorhexidine Gluconate Cloth  6 each Topical Daily  . enoxaparin (LOVENOX) injection  40 mg Subcutaneous Q24H  . feeding supplement (ENSURE ENLIVE)  237 mL Oral TID BM  . mouth rinse  15 mL Mouth Rinse q12n4p  . metoprolol tartrate  12.5 mg Oral BID  . multivitamin with minerals  1 tablet Oral Daily  . pantoprazole  40 mg Oral Daily  . sodium chloride flush  10-40 mL Intracatheter Q12H   Continuous Infusions: . sodium chloride    . sodium chloride 500 mL (02/01/20 0811)  . levETIRAcetam Stopped (02/01/20 0828)  . meropenem (MERREM) IV Stopped (02/01/20 1114)   PRN Meds:.sodium chloride, sodium chloride, acetaminophen, haloperidol lactate, labetalol, levalbuterol, LORazepam, ondansetron  **OR** ondansetron (ZOFRAN) IV, sodium chloride flush  Antibiotics  :   Anti-infectives (From admission, onward)   Start     Dose/Rate Route Frequency Ordered Stop   01/31/20 1000  meropenem (MERREM) 2 g in sodium chloride 0.9 % 100 mL IVPB     2 g 200 mL/hr over 30 Minutes Intravenous Every 8 hours 01/31/20 0939     01/30/20 1400  meropenem (MERREM) 2 g in sodium chloride 0.9 % 100 mL IVPB  Status:  Discontinued     2 g 200 mL/hr over 30 Minutes Intravenous Every 12 hours 01/30/20 1003 01/31/20 0939   01/29/20 1400  meropenem (MERREM) 1 g in sodium chloride 0.9 % 100 mL IVPB  Status:  Discontinued     1 g 200 mL/hr over 30 Minutes Intravenous Every 12 hours 01/29/20 1020 01/30/20 1003   01/28/20 1400  meropenem (MERREM) 2 g in sodium chloride 0.9 % 100 mL IVPB  Status:  Discontinued     2 g 200 mL/hr over 30 Minutes Intravenous Every 12 hours 01/28/20 1039 01/29/20 1020   01/25/20 1200  acyclovir (ZOVIRAX) 610 mg in dextrose 5 % 100 mL IVPB  Status:  Discontinued     610 mg 112.2 mL/hr over 60 Minutes Intravenous Every 8 hours 01/25/20 0924 01/25/20 0933   01/25/20 1000  meropenem (MERREM) 2 g in sodium chloride 0.9 % 100 mL IVPB  Status:  Discontinued  2 g 200 mL/hr over 30 Minutes Intravenous Every 8 hours 01/25/20 0919 01/28/20 1039   01/25/20 1000  vancomycin (VANCOREADY) IVPB 750 mg/150 mL  Status:  Discontinued     750 mg 150 mL/hr over 60 Minutes Intravenous Every 12 hours 01/25/20 0919 01/27/20 1547   01/24/20 0230  acyclovir (ZOVIRAX) 680 mg in dextrose 5 % 100 mL IVPB  Status:  Discontinued     680 mg 113.6 mL/hr over 60 Minutes Intravenous Every 24 hours 01/24/20 0137 01/25/20 0924   01/23/20 2200  meropenem (MERREM) 1 g in sodium chloride 0.9 % 100 mL IVPB  Status:  Discontinued     1 g 200 mL/hr over 30 Minutes Intravenous Every 8 hours 01/23/20 2042 01/23/20 2044   01/23/20 2200  meropenem (MERREM) 2 g in sodium chloride 0.9 % 100 mL IVPB  Status:  Discontinued      2 g 200 mL/hr over 30 Minutes Intravenous Every 8 hours 01/23/20 2044 01/23/20 2052   01/23/20 2100  meropenem (MERREM) 1 g in sodium chloride 0.9 % 100 mL IVPB  Status:  Discontinued     1 g 200 mL/hr over 30 Minutes Intravenous Every 12 hours 01/23/20 2052 01/25/20 0919   01/23/20 2055  vancomycin variable dose per unstable renal function (pharmacist dosing)  Status:  Discontinued      Does not apply See admin instructions 01/23/20 2056 01/25/20 0934   01/23/20 2045  vancomycin (VANCOREADY) IVPB 1750 mg/350 mL     1,750 mg 175 mL/hr over 120 Minutes Intravenous  Once 01/23/20 2042 01/24/20 0010       Objective   Vitals:   01/31/20 2258 02/01/20 0339 02/01/20 0854 02/01/20 1214  BP: (!) 118/57 138/63 (!) 132/58 (!) 141/86  Pulse: 92 96  (!) 111  Resp: 19 20  18   Temp: 98.5 F (36.9 C) 98.7 F (37.1 C) 98.6 F (37 C) 98.2 F (36.8 C)  TempSrc: Oral Oral    SpO2: 99% 98%  99%  Weight:  83.1 kg    Height:        SpO2: 99 % O2 Flow Rate (L/min): 2 L/min  Wt Readings from Last 3 Encounters:  02/01/20 83.1 kg  04/22/18 92.6 kg  04/17/16 95.4 kg     Intake/Output Summary (Last 24 hours) at 02/01/2020 1319 Last data filed at 02/01/2020 1316 Gross per 24 hour  Intake 2850.29 ml  Output 5850 ml  Net -2999.71 ml    Physical Exam:     Awake Alert, Oriented X to self, place, time (year, month, season), context Follows all commands. Decreased strength in left arm [place].AT, Normal respiratory effort on , CTAB Tachycardic, SEM, no edema,  +ve B.Sounds, Abd Soft, No tenderness, No rebound, guarding or rigidity. Left Leg         I have personally reviewed the following:   Data Reviewed:  CBC Recent Labs  Lab 01/25/20 1804 01/25/20 1804 01/26/20 0901 01/26/20 0901 01/27/20 0700 01/27/20 0700 01/28/20 0421 01/29/20 0759 01/30/20 0554 01/31/20 0509 02/01/20 0515  WBC 51.3*   < > 33.6*   < > 25.3*   < > 24.4* 31.1* 25.0* 28.1* 24.8*  HGB 12.6   < > 11.6*    < > 13.3   < > 13.4 12.4 11.3* 10.9* 10.4*  HCT 38.0   < > 34.0*   < > 39.9   < > 40.4 37.9 34.1* 32.8* 31.6*  PLT 146*   < > 179   < >  111*   < > 187 199 212 280 340  MCV 81.5   < > 79.3*   < > 80.0   < > 81.1 81.5 81.0 81.6 81.4  MCH 27.0   < > 27.0   < > 26.7   < > 26.9 26.7 26.8 27.1 26.8  MCHC 33.2   < > 34.1   < > 33.3   < > 33.2 32.7 33.1 33.2 32.9  RDW 17.3*   < > 16.7*   < > 17.7*   < > 17.1* 16.7* 16.5* 16.2* 16.2*  LYMPHSABS 1.2  --  6.4*  --  3.3  --  1.5  --   --   --   --   MONOABS 6.0*  --  2.4*  --  6.6*  --  6.7*  --   --   --   --   EOSABS 0.0  --  1.7*  --  0.0  --  0.2  --   --   --   --   BASOSABS 0.2*  --  0.3*  --  0.0  --  0.1  --   --   --   --    < > = values in this interval not displayed.    Chemistries  Recent Labs  Lab 01/25/20 1804 01/25/20 1804 01/26/20 1223 01/26/20 1223 01/26/20 1422 01/27/20 1211 01/27/20 1211 01/28/20 0421 01/29/20 0759 01/30/20 0554 01/31/20 0509 02/01/20 0515  NA 148*   < > 148*   < >  --  150*   < > 152* 146* 144 141 139  K 3.9   < > 4.4   < >  --  4.5   < > 3.6 4.1 4.1 3.7 3.6  CL 117*   < > 118*   < >  --  119*   < > 118* 114* 115* 110 108  CO2 19*   < > 19*   < >  --  19*   < > 20* 17* 18* 21* 21*  GLUCOSE 156*   < > 135*   < >  --  140*   < > 136* 124* 98 117* 92  BUN 26*   < > 23   < >  --  38*   < > 48* 77* 37* 22 16  CREATININE 0.73   < > 0.75   < >  --  1.23*   < > 1.95* 3.91* 1.12* 0.81 0.80  CALCIUM 8.4*   < > 8.5*   < >  --  8.1*   < > 8.7* 8.5* 8.3* 8.1* 8.1*  MG 2.2  --   --   --  2.1 2.3  --  2.3  --   --   --   --   AST 22  --  31  --   --  32  --  19  --   --   --   --   ALT 86*  --  70*  --   --  50*  --  42  --   --   --   --   ALKPHOS 123  --  117  --   --  102  --  96  --   --   --   --   BILITOT 1.3*  --  QUANTITY NOT SUFFICIENT, UNABLE TO PERFORM TEST  --  1.4* 2.0*  --  1.0  --   --   --   --    < > =  values in this interval not displayed.    ------------------------------------------------------------------------------------------------------------------ No results for input(s): CHOL, HDL, LDLCALC, TRIG, CHOLHDL, LDLDIRECT in the last 72 hours.  No results found for: HGBA1C ------------------------------------------------------------------------------------------------------------------ No results for input(s): TSH, T4TOTAL, T3FREE, THYROIDAB in the last 72 hours.  Invalid input(s): FREET3 ------------------------------------------------------------------------------------------------------------------ No results for input(s): VITAMINB12, FOLATE, FERRITIN, TIBC, IRON, RETICCTPCT in the last 72 hours.  Coagulation profile No results for input(s): INR, PROTIME in the last 168 hours.  No results for input(s): DDIMER in the last 72 hours.  Cardiac Enzymes No results for input(s): CKMB, TROPONINI, MYOGLOBIN in the last 168 hours.  Invalid input(s): CK ------------------------------------------------------------------------------------------------------------------ No results found for: BNP  Micro Results Recent Results (from the past 240 hour(s))  Blood Culture (routine x 2)     Status: Abnormal   Collection Time: 01/23/20  8:29 PM   Specimen: BLOOD LEFT HAND  Result Value Ref Range Status   Specimen Description BLOOD LEFT HAND  Final   Special Requests   Final    BOTTLES DRAWN AEROBIC AND ANAEROBIC Blood Culture adequate volume   Culture  Setup Time   Final    GRAM POSITIVE COCCI IN PAIRS IN BOTH AEROBIC AND ANAEROBIC BOTTLES CRITICAL RESULT CALLED TO, READ BACK BY AND VERIFIED WITH: T. BAUMEISTER, PHARMD AT 1120 ON 01/24/20 BY C. JESSUP, MT.    Culture (A)  Final    STREPTOCOCCUS PNEUMONIAE SUSCEPTIBILITIES PERFORMED ON PREVIOUS CULTURE WITHIN THE LAST 5 DAYS. Performed at [facility], [place] [address], [place], [place] [address]    Report Status 01/26/2020 FINAL  Final  Blood Culture (routine x 2)      Status: Abnormal   Collection Time: 01/23/20  8:55 PM   Specimen: BLOOD RIGHT HAND  Result Value Ref Range Status   Specimen Description BLOOD RIGHT HAND  Final   Special Requests   Final    BOTTLES DRAWN AEROBIC AND ANAEROBIC Blood Culture results may not be optimal due to an inadequate volume of blood received in culture bottles   Culture  Setup Time   Final    GRAM POSITIVE COCCI IN PAIRS IN BOTH AEROBIC AND ANAEROBIC BOTTLES CRITICAL RESULT CALLED TO, READ BACK BY AND VERIFIED WITH: T. BAUMEISTER, PHARMD AT 1120 ON 01/24/20 BY C. JESSUP, MT. Performed at [facility], [place] [address], [place], [place] [address]    Culture STREPTOCOCCUS PNEUMONIAE (A)  Final   Report Status 01/26/2020 FINAL  Final   Organism ID, Bacteria STREPTOCOCCUS PNEUMONIAE  Final      Susceptibility   Streptococcus pneumoniae - MIC*    ERYTHROMYCIN <=0.12 SENSITIVE Sensitive     LEVOFLOXACIN 0.5 SENSITIVE Sensitive     VANCOMYCIN 0.25 SENSITIVE Sensitive     PENICILLIN (meningitis) 0.12 RESISTANT Resistant     PENO - penicillin 0.12      PENICILLIN (non-meningitis) 0.12 SENSITIVE Sensitive     PENICILLIN (oral) 0.12 INTERMEDIATE Intermediate     CEFTRIAXONE (non-meningitis) <=0.12 SENSITIVE Sensitive     CEFTRIAXONE (meningitis) <=0.12 SENSITIVE Sensitive     * STREPTOCOCCUS PNEUMONIAE  Blood Culture ID Panel (Reflexed)     Status: Abnormal   Collection Time: 01/23/20  8:55 PM  Result Value Ref Range Status   Enterococcus species NOT DETECTED NOT DETECTED Final   Listeria monocytogenes NOT DETECTED NOT DETECTED Final   Staphylococcus species NOT DETECTED NOT DETECTED Final   Staphylococcus aureus (BCID) NOT DETECTED NOT DETECTED Final   Streptococcus species DETECTED (A) NOT DETECTED Final  Comment: CRITICAL RESULT CALLED TO, READ BACK BY AND VERIFIED WITH: T. BAUMEISTER, PHARMD AT 1120 ON 01/24/20 BY C. JESSUP, MT.    Streptococcus agalactiae NOT DETECTED NOT DETECTED Final   Streptococcus  pneumoniae DETECTED (A) NOT DETECTED Final    Comment: CRITICAL RESULT CALLED TO, READ BACK BY AND VERIFIED WITH: T. BAUMEISTER, PHARMD AT 1120 ON 01/24/20 BY C. JESSUP, MT.    Streptococcus pyogenes NOT DETECTED NOT DETECTED Final   Acinetobacter baumannii NOT DETECTED NOT DETECTED Final   Enterobacteriaceae species NOT DETECTED NOT DETECTED Final   Enterobacter cloacae complex NOT DETECTED NOT DETECTED Final   Escherichia coli NOT DETECTED NOT DETECTED Final   Klebsiella oxytoca NOT DETECTED NOT DETECTED Final   Klebsiella pneumoniae NOT DETECTED NOT DETECTED Final   Proteus species NOT DETECTED NOT DETECTED Final   Serratia marcescens NOT DETECTED NOT DETECTED Final   Haemophilus influenzae NOT DETECTED NOT DETECTED Final   Neisseria meningitidis NOT DETECTED NOT DETECTED Final   Pseudomonas aeruginosa NOT DETECTED NOT DETECTED Final   Candida albicans NOT DETECTED NOT DETECTED Final   Candida glabrata NOT DETECTED NOT DETECTED Final   Candida krusei NOT DETECTED NOT DETECTED Final   Candida parapsilosis NOT DETECTED NOT DETECTED Final   Candida tropicalis NOT DETECTED NOT DETECTED Final    Comment: Performed at [facility], [place] [address] [place], [place] [address]  Urine culture     Status: None   Collection Time: 01/23/20  9:17 PM   Specimen: In/Out Cath Urine  Result Value Ref Range Status   Specimen Description IN/OUT CATH URINE  Final   Special Requests NONE  Final   Culture   Final    NO GROWTH Performed at [facility], [place] [address] [place] [address] [place], [place] [address]    Report Status 01/25/2020 FINAL  Final  SARS CORONAVIRUS 2 (TAT 6-24 HRS) Nasopharyngeal Nasopharyngeal Swab     Status: None   Collection Time: 01/23/20 11:06 PM   Specimen: Nasopharyngeal Swab  Result Value Ref Range Status   SARS Coronavirus 2 NEGATIVE NEGATIVE Final    Comment: (NOTE) SARS-CoV-2 target nucleic acids are NOT DETECTED. The SARS-CoV-2 RNA is generally detectable in upper and  lower respiratory specimens during the acute phase of infection. Negative results do not preclude SARS-CoV-2 infection, do not rule out co-infections with other pathogens, and should not be used as the sole basis for treatment or other patient management decisions. Negative results must be combined with clinical observations, patient history, and epidemiological information. The expected result is Negative. Fact Sheet for Patients: SugarRoll.be Fact Sheet for Healthcare Providers: https://www.woods-mathews.com/ This test is not yet approved or cleared by the [place] FDA and  has been authorized for detection and/or diagnosis of SARS-CoV-2 by FDA under an Emergency Use Authorization (EUA). This EUA will remain  in effect (meaning this test can be used) for the duration of the COVID-19 declaration under Section 56 4(b)(1) of the Act, 21 U.S.C. section 360bbb-3(b)(1), unless the authorization is terminated or revoked sooner. Performed at [facility], [place] [address], [place], [place] [address]   CSF culture     Status: None   Collection Time: 01/26/20  5:37 PM   Specimen: PATH Cytology CSF; Cerebrospinal Fluid  Result Value Ref Range Status   Specimen Description CSF  Final   Special Requests NONE  Final   Gram Stain   Final    WBC PRESENT, PREDOMINANTLY PMN NO ORGANISMS SEEN CYTOSPIN SMEAR  Culture   Final    NO GROWTH 3 DAYS Performed at [facility], [place] [address], [place], [place] [address]    Report Status 01/30/2020 FINAL  Final  Culture, fungus without smear     Status: None (Preliminary result)   Collection Time: 01/26/20  5:37 PM   Specimen: CSF  Result Value Ref Range Status   Specimen Description CSF  Final   Special Requests NONE  Final   Culture   Final    NO FUNGUS ISOLATED AFTER 5 DAYS Performed at [facility], [place] [address], [place], [place] [address]    Report Status PENDING  Incomplete    Culture, blood (Routine X 2) w Reflex to ID Panel     Status: None (Preliminary result)   Collection Time: 01/29/20  3:11 PM   Specimen: BLOOD  Result Value Ref Range Status   Specimen Description BLOOD RIGHT ANTECUBITAL  Final   Special Requests   Final    BOTTLES DRAWN AEROBIC AND ANAEROBIC Blood Culture adequate volume   Culture   Final    NO GROWTH 3 DAYS Performed at [facility], [place] [address] [place], [place] [address]    Report Status PENDING  Incomplete  Culture, blood (Routine X 2) w Reflex to ID Panel     Status: None (Preliminary result)   Collection Time: 01/29/20  3:19 PM   Specimen: BLOOD RIGHT HAND  Result Value Ref Range Status   Specimen Description BLOOD RIGHT HAND  Final   Special Requests IN PEDIATRIC BOTTLE Blood Culture adequate volume  Final   Culture   Final    NO GROWTH 3 DAYS Performed at [facility], [place] [address], [place], [place] [address]    Report Status PENDING  Incomplete    Radiology Reports CT Abdomen Pelvis Wo Contrast  Result Date: 01/23/2020 CLINICAL DATA:  Patient appears jaundiced history of lymphoma EXAM: CT ABDOMEN AND PELVIS WITHOUT CONTRAST TECHNIQUE: Multidetector CT imaging of the abdomen and pelvis was performed following the standard protocol without IV contrast. COMPARISON:  None. FINDINGS: Lower chest: The visualized heart size within normal limits. No pericardial fluid/thickening. No hiatal hernia. The visualized portions of the lungs are clear. Hepatobiliary: Although limited due to the lack of intravenous contrast, normal in appearance without gross focal abnormality. The patient is status post cholecystectomy. No biliary ductal dilation. Pancreas:  Unremarkable.  No surrounding inflammatory changes. 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 Adrenals/Urinary Tract: Both adrenal glands appear normal. The kidneys and collecting system appear normal without evidence of  urinary tract calculus or hydronephrosis. Bladder is unremarkable. Stomach/Bowel: The stomach, small bowel, and colon are normal in appearance. No inflammatory changes or obstructive findings. Scattered colonic diverticula are noted without diverticulitis. Appendix is normal. Vascular/Lymphatic: There are no enlarged abdominal or pelvic lymph nodes. Scattered aortic atherosclerotic calcifications are seen without aneurysmal dilatation. Reproductive: The uterus and adnexa are unremarkable. Other: No evidence of abdominal wall mass or hernia. Musculoskeletal: No acute or significant osseous findings. IMPRESSION: No acute intra-abdominal or pelvic pathology to explain the patient's symptoms. Diverticulosis without diverticulitis. Aortic Atherosclerosis (ICD10-I70.0). Electronically Signed   By: Prudencio Pair M.D.   On: 01/23/2020 22:03   CT Head Wo Contrast  Result Date: 01/23/2020 CLINICAL DATA:  Altered mental status. Left facial droop. EXAM: CT HEAD WITHOUT CONTRAST TECHNIQUE: Contiguous axial images were obtained from the base of the skull through the  vertex without intravenous contrast. COMPARISON:  Brain MRI 11/24/2019 FINDINGS: Brain: No acute hemorrhage. No extra-axial or subdural collection. Mild generalized atrophy with moderate periventricular and deep white matter hypodensity consistent with chronic small vessel ischemia. This appears slightly asymmetric in the right cerebral hemisphere but no definite cortical infarct. Small calcification in left basal ganglia, likely senescent. No midline shift, hydrocephalus or suspicious mass effect. Vascular: Atherosclerosis of skullbase vasculature without hyperdense vessel or abnormal calcification. Skull: No fracture or focal lesion. Sinuses/Orbits: Chronic paranasal sinus disease with near complete opacification of frontal sinuses, ethmoid air cells, left sphenoid and maxillary sinus. Mucosal thickening in the right sphenoid and maxillary sinus. The degree sinus  opacification has slightly progressed from prior MRI. Mastoid air cells are clear. Other: None. IMPRESSION: 1. No hemorrhage or evidence of acute infarct. 2. Mild atrophy with moderate chronic small vessel ischemia. 3. Chronic paranasal sinus disease, slightly progressed from January MRI. Electronically Signed   By: Keith Rake M.D.   On: 01/23/2020 22:02   MR BRAIN WO CONTRAST  Result Date: 01/24/2020 CLINICAL DATA:  Initial evaluation for acute encephalopathy, altered mental status. History of leukemia, in remission. EXAM: MRI HEAD WITHOUT CONTRAST TECHNIQUE: Multiplanar, multiecho pulse sequences of the brain and surrounding structures were obtained without intravenous contrast. COMPARISON:  Prior CT from 01/23/2020 as well as previous MRI from 11/24/2019. FINDINGS: Brain: Examination somewhat technically limited by susceptibility artifact emanating from the scalp. Generalized age-related cerebral atrophy. Patchy T2/FLAIR hyperintensity within the periventricular and deep white matter both cerebral hemispheres again seen, nonspecific, but most like related chronic microvascular ischemic disease, stable from previous. No abnormal foci of restricted diffusion to suggest acute or subacute ischemia. Gray-white matter differentiation maintained. No encephalomalacia to suggest chronic cortical infarction. Diffuse FLAIR hyperintensity seen throughout the cerebral sulci, suspected to be artifactual nature due to overlying susceptibility artifact. Possible proteinaceous material not entirely excluded. No associated susceptibility artifact to suggest subarachnoid hemorrhage. No mass lesion, midline shift, or mass effect. Ventricles normal size without hydrocephalus. No appreciable intraventricular debris. No extra-axial fluid collection. No made of an empty sella. Midline structures intact. Vascular: Major intracranial vascular flow voids are maintained. Skull and upper cervical spine: Craniocervical junction  within normal limits. Postsurgical changes partially visualize within the upper cervical spine. Bone marrow signal intensity heterogeneous and diffusely decreased on T1 weighted imaging, most like related history of leukemia. Susceptibility artifact involving the left greater than right scalp, of uncertain etiology. Sinuses/Orbits: Globes and orbital soft tissues demonstrate no acute finding. Extensive opacity and mucosal thickening seen throughout the paranasal sinuses, with superimposed right maxillary sinus air-fluid level. Findings could be infectious and/or inflammatory nature. Trace bilateral mastoid effusions. Visualized inner ear structures grossly normal. Other: None. IMPRESSION: 1. Technically limited exam due to susceptibility artifact emanating from the scalp, of uncertain etiology. 2. Diffuse FLAIR hyperintensity throughout the cerebral sulci and surrounding the brainstem and cerebellum. While this finding could be artifactual in nature related to overlying susceptibility artifact, proteinaceous material and changes related to meningitis could also have this appearance. Correlation with LP and CSF studies recommended for correlated purposes as clinically warranted. No susceptibility artifact to suggest subarachnoid hemorrhage. 3. 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 4. Extensive pan sinusitis. This could be either infectious or inflammatory in nature. Electronically Signed   By: Jeannine Boga M.D.   On: 01/24/2020 06:46   US RENAL  Result Date: 01/29/2020 CLINICAL DATA:  Acute kidney injury EXAM: RENAL /  URINARY TRACT ULTRASOUND COMPLETE COMPARISON:  None. FINDINGS: Right Kidney: Renal measurements: 11.1 x 6.2 x 6.6 cm = volume: Is 236 mL. There is mild pelviectasis without frank hydronephrosis. Left Kidney: Renal measurements: 12 x 6.5 x 6.3 cm = volume: 255 mL. There is mild pelviectasis without frank  hydronephrosis. Bladder: Only the left ureteral jet was visualized. Other: None. IMPRESSION: 1. Mild bilateral pelviectasis without evidence for frank hydronephrosis. 2. Only the left ureteral jet was visualized on today's exam. 3. No shadowing echogenic stones identified on this study. Electronically Signed   By: Constance Holster M.D.   On: 01/29/2020 16:46   IR Fluoro Guide Ndl Plmt / BX  Result Date: 01/27/2020 CLINICAL DATA:  Confusion, altered mental status EXAM: DIAGNOSTIC LUMBAR PUNCTURE UNDER FLUOROSCOPIC GUIDANCE FLUOROSCOPY TIME:  0.1 minute; 12  uGym2 DAP PROCEDURE: Informed consent was obtained from the family prior to the procedure, including potential complications of headache, allergy, and pain. With the patient prone, the lower back was prepped with Betadine. Intravenous Fentanyl 19mcg and Versed 1mg  were administered as conscious sedation during continuous monitoring of the patient's level of consciousness and physiological / cardiorespiratory status by the radiology RN, with a total moderate sedation time of 35 minutes. 1% Lidocaine was used for local anesthesia. Lumbar puncture was performed at the L3-4 level from a right parasagittal approach using a 20 gauge needle with return of yellow cloudy CSF with an opening pressure of 13 cm water. 9 ml of CSF were obtained for laboratory studies. The patient tolerated the procedure well and there were no apparent complications. IMPRESSION: 1. Technically successful lumbar puncture under fluoroscopy Electronically Signed   By: Lucrezia Europe M.D.   On: 01/27/2020 07:59   DG CHEST PORT 1 VIEW  Result Date: 01/27/2020 CLINICAL DATA:  Shortness of breath. History of leukemia. EXAM: PORTABLE CHEST 1 VIEW COMPARISON:  01/27/2020 FINDINGS: Midline trachea. Borderline cardiomegaly. Atherosclerosis in the transverse aorta. No pleural effusion or pneumothorax. Surgical clips in the left upper quadrant. No congestive failure. No lobar consolidation.  IMPRESSION: Borderline cardiomegaly, without acute disease. Aortic Atherosclerosis (ICD10-I70.0). Electronically Signed   By: Abigail Miyamoto M.D.   On: 01/27/2020 19:16   DG CHEST PORT 1 VIEW  Result Date: 01/27/2020 CLINICAL DATA:  SOB (shortness of breath) EXAM: PORTABLE CHEST - 1 VIEW COMPARISON:  the previous day's study FINDINGS: Relatively low lung volumes with resultant crowding of bronchovascular structures. The mild interstitial edema seen previously continues to improve. No new airspace disease. Heart size within normal limits for technique. Aortic Atherosclerosis (ICD10-170.0). No effusion. No pneumothorax. Cervical fixation hardware partially visualized. Surgical clips in the upper abdomen. IMPRESSION: Improving interstitial edema. Electronically Signed   By: Lucrezia Europe M.D.   On: 01/27/2020 11:55   DG CHEST PORT 1 VIEW  Result Date: 01/26/2020 CLINICAL DATA:  Leukocytosis EXAM: PORTABLE CHEST 1 VIEW COMPARISON:  01/24/2020 FINDINGS: The cardiac silhouette, mediastinal and hilar contours are stable. Low lung volumes with vascular crowding and streaky atelectasis. Slight improved lung aeration since the prior study with less vascular congestion and resolving edema. No pleural effusions. IMPRESSION: 1. Low lung volumes with vascular crowding and streaky atelectasis. 2. Improved lung aeration. Electronically Signed   By: Marijo Sanes M.D.   On: 01/26/2020 07:34   DG CHEST PORT 1 VIEW  Result Date: 01/24/2020 CLINICAL DATA:  Shortness of breath and hypoxia. EXAM: PORTABLE CHEST 1 VIEW COMPARISON:  Radiograph yesterday. FINDINGS: Lower lung volumes from prior exam. Prominent heart size likely accentuated by  technique. Bronchovascular crowding versus vascular congestion. No significant pleural effusion. No pneumothorax. No confluent airspace disease. No acute osseous abnormalities are seen. IMPRESSION: Lower lung volumes from prior exam. Bronchovascular crowding versus vascular congestion. Prominent  heart size likely accentuated by technique and low lung volumes. Electronically Signed   By: Keith Rake M.D.   On: 01/24/2020 19:19   DG Chest Port 1 View  Result Date: 01/23/2020 CLINICAL DATA:  69 year old female with sepsis. EXAM: PORTABLE CHEST 1 VIEW COMPARISON:  Chest radiographs 12/19/2018 and earlier. FINDINGS: Portable AP semi upright view at 2045 hours. Larger lung volumes. Cardiac size at the upper limits of normal. Calcified aortic atherosclerosis. Other mediastinal contours are within normal limits. Visualized tracheal air column is within normal limits. Allowing for portable technique the lungs are clear. Chronic left upper quadrant surgical clips and cervical ACDF. Negative visible bowel gas pattern. No acute osseous abnormality identified. IMPRESSION: No acute cardiopulmonary abnormality. Aortic Atherosclerosis (ICD10-I70.0). Electronically Signed   By: Genevie Ann M.D.   On: 01/23/2020 20:55   EEG adult  Result Date: 01/24/2020 Lora Havens, MD     01/24/2020 10:59 AM Patient Name: Kathleen Duran MRN: [id number] Epilepsy Attending: Lora Havens Referring Physician/Provider: Dr Amie [place] Date: 01/24/2020 Duration: 24.09 mins Patient history: 69 y.o.femalewith past medical history significant for leukemia, non-Hodgkin's lymphoma 2002 presented with altered mental status, fever, significantly elevated leukocytosis, with a rightward gaze preference. EEG to evaluate for status epilepticus. Level of alertness: lethargic AEDs during EEG study: Ativan, Keppra Technical aspects: This EEG study was done with scalp electrodes positioned according to the 10-20 International system of electrode placement. Electrical activity was acquired at a sampling rate of 500Hz  and reviewed with a high frequency filter of 70Hz  and a low frequency filter of 1Hz . EEG data were recorded continuously and digitally stored. DESCRIPTION: EEG showed continuous 3-4Hz  delta slowing in right hemisphere. There  is also continuous 5-8Hz  theta-alpha activity in left hemisphere with intermittent 2-3hz  delta activity..  Hyperventilation and photic stimulation were not performed. ABNORMALITY - Continuous slow, generalized and lateralized right hemisphere IMPRESSION: This study is suggestive of cortical dysfunction in right hemisphere likely secondary to post-ictal state, underlying structural abnormality.  No seizures or definite epileptiform discharges were seen throughout the recording. Dr Rory Percy was notified. Priyanka Barbra Sarks   Overnight EEG with video  Result Date: 01/24/2020 Lora Havens, MD     01/25/2020 10:07 AM Patient Name: Kathleen Duran MRN: [id number] Epilepsy Attending: Lora Havens Referring Physician/Provider: Dr Amie [place] Duration: 01/24/2020 [id number] to 01/25/2020 [id number]  Patient history: 69 y.o.femalewith past medical history significant for leukemia, non-Hodgkin's lymphoma 2002 presented with altered mental status, fever, significantly elevated leukocytosis, with a rightward gaze preference. EEG to evaluate for status epilepticus.  Level of alertness: lethargic  AEDs during EEG study: Ativan, Keppra  Technical aspects: This EEG study was done with scalp electrodes positioned according to the 10-20 International system of electrode placement. Electrical activity was acquired at a sampling rate of 500Hz  and reviewed with a high frequency filter of 70Hz  and a low frequency filter of 1Hz . EEG data were recorded continuously and digitally stored.  DESCRIPTION: EEG showed continuous 5-8Hz  theta-alpha activity in left hemisphere with intermittent 2-3hz  delta activity which at times appears quasi rhythmic. Sharp waves were also noted in left frontal region. Continuous 3-4Hz  low amplitude delta slowing was also noted in right hemisphere. Hyperventilation and photic stimulation were not performed.  ABNORMALITY - Sharp waves, left frontotemporal region -  Intermittent rhythmic delta activity, left  frontal region - Continuous slow, generalized and lateralized right hemisphere  IMPRESSION: This study is suggestive of epileptogenicity and cortical dysfunction in left frontotemporal region. Quasi rhythmic delta activity with sharp waves in left frontotemporal region can be on the ictal-interictal continuum. Additionally, there is evidence of cortical dysfunction in right hemisphere likely secondary to post-ictal state, underlying structural abnormality.  No definite seizures were seen throughout the recording. Lora Havens   ECHOCARDIOGRAM COMPLETE  Result Date: 01/30/2020    ECHOCARDIOGRAM REPORT   Patient Name:   Kathleen Duran [place] Date of Exam: 01/30/2020 Medical Rec #:  [id number]            Height:       62.6 in Accession #:    [id number]           Weight:       176.4 lb Date of Birth:  [birth year]             BSA:          1.826 m Patient Age:    69 years             BP:           127/49 mmHg Patient Gender: F                    HR:           99 bpm. Exam Location:  Inpatient Procedure: 2D Echo Indications:    Bacteremia 790.7 / R78.81  History:        Patient has no prior history of Echocardiogram examinations.                 Risk Factors:Hypertension. Acute kidney                 Sinus tachycardia                 Sepsis                 Meningitis.  Sonographer:    Vikki Ports Turrentine Referring Phys: [id number] [place]  1. Normal LV systolic function; grade 1 diastolic dysfunction; very mild AS (mean gradient 11 mmHg); mild AI; moderate LAE.  2. Left ventricular ejection fraction, by estimation, is 50 to 55%. The left ventricle has low normal function. The left ventricle has no regional wall motion abnormalities. Left ventricular diastolic parameters are consistent with Grade I diastolic dysfunction (impaired relaxation). Elevated left atrial pressure.  3. Right ventricular systolic function is normal. The right ventricular size is normal.  4. Left atrial size was moderately dilated.   5. The mitral valve is normal in structure. Trivial mitral valve regurgitation. No evidence of mitral stenosis.  6. The aortic valve is tricuspid. Aortic valve regurgitation is mild. Mild aortic valve stenosis.  7. The inferior vena cava is normal in size with greater than 50% respiratory variability, suggesting right atrial pressure of 3 mmHg. FINDINGS  Left Ventricle: Left ventricular ejection fraction, by estimation, is 50 to 55%. The left ventricle has low normal function. The left ventricle has no regional wall motion abnormalities. The left ventricular internal cavity size was normal in size. There is no left ventricular hypertrophy. Left ventricular diastolic parameters are consistent with Grade I diastolic dysfunction (impaired relaxation). Elevated left atrial pressure. Right Ventricle: The right ventricular size is normal. Right ventricular systolic function is normal. Left Atrium: Left atrial size was moderately dilated. Right  Atrium: Right atrial size was normal in size. Pericardium: Trivial pericardial effusion is present. Mitral Valve: The mitral valve is normal in structure. Normal mobility of the mitral valve leaflets. Mild mitral annular calcification. Trivial mitral valve regurgitation. No evidence of mitral valve stenosis. Tricuspid Valve: The tricuspid valve is normal in structure. Tricuspid valve regurgitation is trivial. No evidence of tricuspid stenosis. Aortic Valve: The aortic valve is tricuspid. Aortic valve regurgitation is mild. Aortic regurgitation PHT measures 349 msec. Mild aortic stenosis is present. Aortic valve mean gradient measures 11.3 mmHg. Aortic valve peak gradient measures 15.7 mmHg. Aortic valve area, by VTI measures 1.50 cm. Pulmonic Valve: The pulmonic valve was normal in structure. Pulmonic valve regurgitation is not visualized. No evidence of pulmonic stenosis. Aorta: The aortic root is normal in size and structure. Venous: The inferior vena cava is normal in size with  greater than 50% respiratory variability, suggesting right atrial pressure of 3 mmHg.  Additional Comments: Normal LV systolic function; grade 1 diastolic dysfunction; very mild AS (mean gradient 11 mmHg); mild AI; moderate LAE.  LEFT VENTRICLE PLAX 2D LVIDd:         4.60 cm  Diastology LVIDs:         3.20 cm  LV e' lateral:   6.53 cm/s LV PW:         1.10 cm  LV E/e' lateral: 13.2 LV IVS:        1.10 cm  LV e' medial:    5.00 cm/s LVOT diam:     1.70 cm  LV E/e' medial:  17.2 LV SV:         51 LV SV Index:   28 LVOT Area:     2.27 cm  RIGHT VENTRICLE RV S prime:     22.00 cm/s TAPSE (M-mode): 2.9 cm LEFT ATRIUM             Index LA diam:        4.20 cm 2.30 cm/m LA Vol (A2C):   78.5 ml 43.00 ml/m LA Vol (A4C):   82.4 ml 45.14 ml/m LA Biplane Vol: 81.0 ml 44.37 ml/m  AORTIC VALVE AV Area (Vmax):    1.50 cm AV Area (Vmean):   1.30 cm AV Area (VTI):     1.50 cm AV Vmax:           198.00 cm/s AV Vmean:          159.667 cm/s AV VTI:            0.343 m AV Peak Grad:      15.7 mmHg AV Mean Grad:      11.3 mmHg LVOT Vmax:         131.00 cm/s LVOT Vmean:        91.700 cm/s LVOT VTI:          0.226 m LVOT/AV VTI ratio: 0.66 AI PHT:            349 msec  AORTA Ao Root diam: 3.20 cm MITRAL VALVE MV Area (PHT): 7.02 cm     SHUNTS MV Decel Time: 108 msec     Systemic VTI:  0.23 m MV E velocity: 85.90 cm/s   Systemic Diam: 1.70 cm MV A velocity: 139.00 cm/s MV E/A ratio:  0.62 Kirk Ruths MD Electronically signed by Kirk Ruths MD Signature Date/Time: 01/30/2020/3:37:01 PM    Final      Time Spent in minutes  30     Desiree Hane M.D on  02/01/2020 at 1:19 PM  To page go to [email or web address] - password [facility]

## 2020-02-01 NOTE — Progress Notes (Signed)
This am during shift assessment, RN found stage 2 pressure ulcer on pt coccyx and serous filled blister on inner left thigh. Confirmed with second rn and foam dsg applied to both. Pt educated on turning positions to relieve pressure off sacrum and pt voices understanding. Pt turned to her right side. Will continue to closely monitor. Delia Heady RN

## 2020-02-01 NOTE — Evaluation (Signed)
Physical Therapy Evaluation Patient Details Name: Kathleen Duran MRN: [id number] DOB: April 06, 1951 Today's Date: 02/01/2020   History of Present Illness  Patient is a 69 y/o female who presents with AMS, fever, nausea, vomiting and progressive agitation and confusion. 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  Clinical Impression  Patient presents with generalized weakness, decreased AROM/strength LUE, pain, impaired balance and impaired mobility s/p above. Pt independent and lives with spouse PTA. Today, pt requires Mod A for bed mobility, transfers and Min A for gait training with use of RW for support. VSS on RA. Cognition seems to be improving per notes but further assessment warranted. Encouraged OOB to chair for all meals. Would benefit from CIR to maximize independence and mobility prior to return home. Will follow acutely.    Follow Up Recommendations CIR;Supervision for mobility/OOB;Supervision/Assistance - 24 hour    Equipment Recommendations  Other (comment)(defer to next venue)    Recommendations for Other Services OT consult;Rehab consult     Precautions / Restrictions Precautions Precautions: Fall Restrictions Weight Bearing Restrictions: No      Mobility  Bed Mobility Overal bed mobility: Needs Assistance Bed Mobility: Rolling;Sidelying to Sit Rolling: Min assist Sidelying to sit: Mod assist;HOB elevated       General bed mobility comments: Assist with LEs, bottom and trunk to get to EOB. Able to reach for rail with RUE. Increased time and cues, not able to assist with LUE.  Transfers Overall transfer level: Needs assistance Equipment used: Rolling walker (2 wheeled) Transfers: Sit to/from Stand Sit to Stand: Mod assist         General transfer comment: Assist to power to standing with use of momentum and cues for hand placement. Transferred to chair post  ambulation.  Ambulation/Gait Ambulation/Gait assistance: Min assist Gait Distance (Feet): 12 Feet Assistive device: Rolling walker (2 wheeled) Gait Pattern/deviations: Step-through pattern;Decreased stride length;Trunk flexed Gait velocity: decreased   General Gait Details: Slow, unsteady gait with decreased eccentric control of LEs and increased WB through BUEs. Bil knee instability but no buckling.  Stairs            Wheelchair Mobility    Modified Rankin (Stroke Patients Only)       Balance Overall balance assessment: Needs assistance Sitting-balance support: Feet supported;No upper extremity supported Sitting balance-Leahy Scale: Good Sitting balance - Comments: supervision   Standing balance support: During functional activity Standing balance-Leahy Scale: Poor Standing balance comment: requires UE support and external support                             Pertinent Vitals/Pain Pain Assessment: Faces Faces Pain Scale: Hurts even more Pain Location: neck and LUE esp with movement Pain Descriptors / Indicators: Sore;Grimacing;Guarding Pain Intervention(s): Repositioned;Monitored during session    Home Living Family/patient expects to be discharged to:: Private residence Living Arrangements: Spouse/significant other Available Help at Discharge: Family;Available PRN/intermittently Type of Home: House Home Access: Stairs to enter Entrance Stairs-Rails: Right Entrance Stairs-Number of Steps: 6 Home Layout: One level Home Equipment: None      Prior Function Level of Independence: Independent         Comments: Drives, does grocery shopping, cooking, cleaning.     Hand Dominance        Extremity/Trunk Assessment   Upper Extremity Assessment Upper Extremity Assessment: Defer to OT evaluation;LUE deficits/detail LUE Deficits / Details: Limited AROM throughout especially with shoulder elevation,  abduction, elbow flexion. LUE Sensation: WNL     Lower Extremity Assessment Lower Extremity Assessment: Generalized weakness    Cervical / Trunk Assessment Cervical / Trunk Assessment: (rigid neck with pain)  Communication   Communication: No difficulties  Cognition Arousal/Alertness: Awake/alert Behavior During Therapy: WFL for tasks assessed/performed Overall Cognitive Status: No family/caregiver present to determine baseline cognitive functioning Area of Impairment: Memory;Problem solving                     Memory: Decreased short-term memory       Problem Solving: Decreased initiation;Requires verbal cues General Comments: A&Ox4 today; impaired memory esp preceding days before admission. WFL for basic mobility tasks. needs further assessment      General Comments General comments (skin integrity, edema, etc.): VSS on RA, has a sitter present.    Exercises     Assessment/Plan    PT Assessment Patient needs continued PT services  PT Problem List Decreased strength;Decreased mobility;Decreased safety awareness;Pain;Decreased balance;Decreased activity tolerance;Decreased cognition;Decreased range of motion       PT Treatment Interventions Therapeutic activities;Gait training;Therapeutic exercise;Patient/family education;Balance training;Functional mobility training;Neuromuscular re-education    PT Goals (Current goals can be found in the Care Plan section)  Acute Rehab PT Goals Patient Stated Goal: to get back to independence PT Goal Formulation: With patient Time For Goal Achievement: 02/15/20 Potential to Achieve Goals: Good    Frequency Min 3X/week   Barriers to discharge        Co-evaluation               AM-PAC PT "6 Clicks" Mobility  Outcome Measure Help needed turning from your back to your side while in a flat bed without using bedrails?: A Little Help needed moving from lying on your back to sitting on the side of a flat bed without using bedrails?: A Lot Help needed moving to and  from a bed to a chair (including a wheelchair)?: A Lot Help needed standing up from a chair using your arms (e.g., wheelchair or bedside chair)?: A Lot Help needed to walk in hospital room?: A Little Help needed climbing 3-5 steps with a railing? : Total 6 Click Score: 13    End of Session Equipment Utilized During Treatment: Gait belt Activity Tolerance: Patient tolerated treatment well Patient left: in chair;with call bell/phone within reach;with nursing/sitter in room(sitter in room) Nurse Communication: Mobility status PT Visit Diagnosis: Muscle weakness (generalized) (M62.81);Difficulty in walking, not elsewhere classified (R26.2);Unsteadiness on feet (R26.81)    Time: 1125-1150 PT Time Calculation (min) (ACUTE ONLY): 25 min   Charges:   PT Evaluation $PT Eval Moderate Complexity: 1 Mod PT Treatments $Therapeutic Activity: 8-22 mins        Marisa Severin, PT, DPT Acute Rehabilitation Services Pager [phone] Office [place] 02/01/2020, 3:32 PM

## 2020-02-01 NOTE — Progress Notes (Signed)
Pt HR noted to be in 120's with high of 125. Pt sitting up in chair, denies any pain or discomforted when assessed. MD notified and new order received for EKG. MD to come assess pt. Will continue to closely monitor. Delia Heady RN

## 2020-02-01 NOTE — Progress Notes (Signed)
Telemetry called to report pt having 9 beats of V-tach, pt assessed to be sleeping in bed. Pt denies any pain or discomfort, asymptomatic. MD paged and notified, no new orders received, awaiting on response from MD. Will continue to closely monitor. Delia Heady RN

## 2020-02-02 ENCOUNTER — Inpatient Hospital Stay (HOSPITAL_COMMUNITY): Payer: Medicare HMO

## 2020-02-02 DIAGNOSIS — I350 Nonrheumatic aortic (valve) stenosis: Secondary | ICD-10-CM

## 2020-02-02 DIAGNOSIS — B953 Streptococcus pneumoniae as the cause of diseases classified elsewhere: Secondary | ICD-10-CM

## 2020-02-02 DIAGNOSIS — M25512 Pain in left shoulder: Secondary | ICD-10-CM

## 2020-02-02 DIAGNOSIS — G001 Pneumococcal meningitis: Secondary | ICD-10-CM

## 2020-02-02 DIAGNOSIS — R7881 Bacteremia: Secondary | ICD-10-CM

## 2020-02-02 DIAGNOSIS — J849 Interstitial pulmonary disease, unspecified: Secondary | ICD-10-CM

## 2020-02-02 LAB — CBC
HCT: 31.5 % — ABNORMAL LOW (ref 36.0–46.0)
Hemoglobin: 10.5 g/dL — ABNORMAL LOW (ref 12.0–15.0)
MCH: 27.1 pg (ref 26.0–34.0)
MCHC: 33.3 g/dL (ref 30.0–36.0)
MCV: 81.2 fL (ref 80.0–100.0)
Platelets: 434 10*3/uL — ABNORMAL HIGH (ref 150–400)
RBC: 3.88 MIL/uL (ref 3.87–5.11)
RDW: 16 % — ABNORMAL HIGH (ref 11.5–15.5)
WBC: 24.5 10*3/uL — ABNORMAL HIGH (ref 4.0–10.5)
nRBC: 0 % (ref 0.0–0.2)

## 2020-02-02 LAB — BASIC METABOLIC PANEL
Anion gap: 9 (ref 5–15)
BUN: 16 mg/dL (ref 8–23)
CO2: 22 mmol/L (ref 22–32)
Calcium: 8.2 mg/dL — ABNORMAL LOW (ref 8.9–10.3)
Chloride: 106 mmol/L (ref 98–111)
Creatinine, Ser: 0.65 mg/dL (ref 0.44–1.00)
GFR calc Af Amer: >60 mL/min (ref >60)
GFR calc non Af Amer: >60 mL/min (ref >60)
Glucose, Bld: 109 mg/dL — ABNORMAL HIGH (ref 70–99)
Potassium: 4.2 mmol/L (ref 3.5–5.1)
Sodium: 137 mmol/L (ref 135–145)

## 2020-02-02 LAB — GLUCOSE, CAPILLARY
Glucose-Capillary: 122 mg/dL — ABNORMAL HIGH (ref 70–99)
Glucose-Capillary: 175 mg/dL — ABNORMAL HIGH (ref 70–99)
Glucose-Capillary: 90 mg/dL (ref 70–99)
Glucose-Capillary: 101 mg/dL — ABNORMAL HIGH (ref 70–99)

## 2020-02-02 LAB — MAGNESIUM
Magnesium: 1.7 mg/dL (ref 1.7–2.4)
Magnesium: 1.7 mg/dL (ref 1.7–2.4)

## 2020-02-02 NOTE — Evaluation (Signed)
Occupational Therapy Evaluation Patient Details Name: Kathleen Duran MRN: [id number] DOB: 1951-01-10 Today's Date: 02/02/2020    History of Present Illness Patient is a 69 y/o female who presents with AMS, fever, nausea, vomiting and progressive agitation and confusion. 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   Clinical Impression   PT admitted with AMS and bacteremic pneumococcal meningitis. Pt currently with functional limitiations due to the deficits listed below (see OT problem list). Pt with incorrect lunch arriving to room and correctly describes what was ordered. Kitchen reports the tray was an error and patient is correct in reporting what she ordered. Pt reports pain L UE throughout session.  Pt will benefit from skilled OT to increase their independence and safety with adls and balance to allow discharge CIR.     Follow Up Recommendations  CIR    Equipment Recommendations  3 in 1 bedside commode;Other (comment)(RW)    Recommendations for Other Services Rehab consult     Precautions / Restrictions Precautions Precautions: Fall Restrictions Weight Bearing Restrictions: No      Mobility Bed Mobility               General bed mobility comments: oob in chair  Transfers Overall transfer level: Needs assistance Equipment used: Rolling walker (2 wheeled) Transfers: Sit to/from Stand Sit to Stand: Mod assist         General transfer comment: requires (A) to power up and max cues for hand placement. pt requires R UE to place L UE on RW with decrease grasp    Balance Overall balance assessment: Needs assistance Sitting-balance support: Bilateral upper extremity supported;Feet supported Sitting balance-Leahy Scale: Poor     Standing balance support: Bilateral upper extremity supported;During functional activity Standing balance-Leahy Scale: Poor Standing balance comment:  requires use of RW and fatigues with progressive movement                           ADL either performed or assessed with clinical judgement   ADL Overall ADL's : Needs assistance/impaired Eating/Feeding: Minimal assistance Eating/Feeding Details (indicate cue type and reason): pt unable to open ice cream due to lack of L UE engagement. pt able to eat with R UE  Grooming: Wash/dry hands;Wash/dry face;Minimal assistance   Upper Body Bathing: Maximal assistance   Lower Body Bathing: Total assistance   Upper Body Dressing : Maximal assistance   Lower Body Dressing: Total assistance   Toilet Transfer: Maximal assistance;Ambulation;RW Toilet Transfer Details (indicate cue type and reason): requires (A) To elevate from surface         Functional mobility during ADLs: Minimal assistance;Rolling walker General ADL Comments: pt with L LE dragging compared to R LE. pt with progressive weakness with movement     Vision Baseline Vision/History: Wears glasses Wears Glasses: Reading only       Perception     Praxis Praxis Praxis tested?: Deficits Deficits: Motor Impersistence    Pertinent Vitals/Pain Pain Assessment: Faces Faces Pain Scale: Hurts even more Pain Location: L Ue Pain Descriptors / Indicators: Grimacing;Discomfort Pain Intervention(s): Monitored during session;Repositioned     Hand Dominance Right   Extremity/Trunk Assessment Upper Extremity Assessment Upper Extremity Assessment: LUE deficits/detail LUE Deficits / Details: limited AAROM- tolerates shoulder flexion to 70 degrees with grimace. pt only able to activate L UE to 20 degrees. pt uable to sustain shoulder flexion if positioned  LUE Coordination: decreased fine motor;decreased  gross motor(tremors noted)   Lower Extremity Assessment Lower Extremity Assessment: Defer to PT evaluation   Cervical / Trunk Assessment Cervical / Trunk Assessment: Kyphotic   Communication  Communication Communication: No difficulties   Cognition Arousal/Alertness: Awake/alert Behavior During Therapy: WFL for tasks assessed/performed Overall Cognitive Status: No family/caregiver present to determine baseline cognitive functioning Area of Impairment: Memory                     Memory: Decreased short-term memory         General Comments: oriented x4 but unable to recall 3 words after 5 minutes. decrease awareness to deficits   General Comments  sitter in room, noted to have L UE pain. Pt wanting CNA to help with bath and not engaged    Exercises     Shoulder Instructions      Home Living Family/patient expects to be discharged to:: Private residence Living Arrangements: Spouse/significant other Available Help at Discharge: Family;Available PRN/intermittently Type of Home: House Home Access: Stairs to enter [employer] of Steps: 6 Entrance Stairs-Rails: Right Home Layout: One level     Bathroom Shower/Tub: Teacher, early years/pre: Standard     Home Equipment: None          Prior Functioning/Environment Level of Independence: Independent        Comments: Drives, does grocery shopping, cooking, cleaning.        OT Problem List: Decreased strength;Decreased range of motion;Decreased activity tolerance;Impaired balance (sitting and/or standing);Decreased safety awareness;Decreased knowledge of use of DME or AE;Decreased knowledge of precautions;Pain      OT Treatment/Interventions: Self-care/ADL training;Therapeutic exercise;Neuromuscular education;Energy conservation;DME and/or AE instruction;Manual therapy;Therapeutic activities;Patient/family education;Balance training    OT Goals(Current goals can be found in the care plan section) Acute Rehab OT Goals Patient Stated Goal: to get back to independence OT Goal Formulation: With patient Time For Goal Achievement: 02/16/20 Potential to Achieve Goals: Good  OT  Frequency: Min 3X/week   Barriers to D/C:            Co-evaluation              AM-PAC OT "6 Clicks" Daily Activity     Outcome Measure Help from another person eating meals?: A Little Help from another person taking care of personal grooming?: A Lot Help from another person toileting, which includes using toliet, bedpan, or urinal?: A Lot Help from another person bathing (including washing, rinsing, drying)?: A Lot Help from another person to put on and taking off regular upper body clothing?: A Lot Help from another person to put on and taking off regular lower body clothing?: Total 6 Click Score: 12   End of Session Equipment Utilized During Treatment: Gait belt;Rolling walker Nurse Communication: Mobility status;Precautions  Activity Tolerance: Patient tolerated treatment well Patient left: in chair;with call bell/phone within reach;with nursing/sitter in room  OT Visit Diagnosis: Unsteadiness on feet (R26.81);Muscle weakness (generalized) (M62.81);Pain Pain - Right/Left: Left Pain - part of body: Shoulder                Time: [id number] OT Time Calculation (min): 27 min Charges:  OT General Charges $OT Visit: 1 Visit OT Evaluation $OT Eval Moderate Complexity: 1 Mod   Brynn, OTR/L  Acute Rehabilitation Services Pager: [phone] Office: [phone] .   Jeri Modena 02/02/2020, 12:58 PM

## 2020-02-02 NOTE — Progress Notes (Signed)
Inpatient Rehab Admissions:  Inpatient Rehab Consult received.  I attempted to meet with pt at bedside, but she was off the unit for testing.  Will f/u tomorrow for assessment.   Signed: Shann Medal, PT, DPT Admissions Coordinator [phone] 02/02/20  4:34 PM

## 2020-02-02 NOTE — Progress Notes (Signed)
Patient ID: Kathleen Duran, female   DOB: 02-Aug-1951, 69 y.o.   MRN: [id number]         [facility] for Infectious Disease  Date of Admission:  01/23/2020           Day 11 meropenem ASSESSMENT: She is improving on therapy for severe bacteremic pneumococcal pneumonia and pansinusitis.  I am not sure why she is having left shoulder pain.  There does not appear to be any clinical evidence of septic arthritis.  PLAN: 1. Continue meropenem for 3 more days 2. Physical therapy  Principal Problem:   Pneumococcal meningitis Active Problems:   Sepsis ([place])   Bacteremia due to Streptococcus pneumoniae   Essential hypertension   AKI (acute kidney injury) ([place])   Dehydration with hypernatremia   Chronic pansinusitis   Interstitial lung disease ([facility])   GERD (gastroesophageal reflux disease)   History of chronic lymphocytic leukemia   Status post splenectomy   Unintentional weight loss   Scheduled Meds: . chlorhexidine  15 mL Mouth Rinse BID  . Chlorhexidine Gluconate Cloth  6 each Topical Daily  . enoxaparin (LOVENOX) injection  40 mg Subcutaneous Q24H  . feeding supplement (ENSURE ENLIVE)  237 mL Oral TID BM  . levETIRAcetam  750 mg Oral BID  . mouth rinse  15 mL Mouth Rinse q12n4p  . metoprolol tartrate  25 mg Oral BID  . multivitamin with minerals  1 tablet Oral Daily  . pantoprazole  40 mg Oral Daily  . sodium chloride flush  10-40 mL Intracatheter Q12H   Continuous Infusions: . sodium chloride    . sodium chloride 500 mL (02/01/20 0811)  . sodium chloride 100 mL/hr at 02/02/20 0617  . meropenem (MERREM) IV 2 g (02/02/20 0810)   PRN Meds:.sodium chloride, sodium chloride, acetaminophen, haloperidol lactate, labetalol, levalbuterol, LORazepam, ondansetron **OR** ondansetron (ZOFRAN) IV, sodium chloride flush   SUBJECTIVE: She is feeling better.  She has some intermittent headache and is complaining of left shoulder and "tailbone" pain.  She did stand and walk to the  door of her room yesterday.  Her husband reports that he can see improvements in her memory on a daily basis.  Review of Systems: Review of Systems  Constitutional: Positive for malaise/fatigue and weight loss. Negative for fever.  HENT: Negative for hearing loss.   Eyes: Negative for blurred vision.  Respiratory: Negative for cough.   Cardiovascular: Negative for chest pain.  Gastrointestinal: Negative for abdominal pain, diarrhea, nausea and vomiting.  Musculoskeletal: Positive for joint pain and neck pain. Negative for back pain.  Neurological: Positive for headaches.       Her husband notes that her speech is still somewhat slurred.    Allergies  Allergen Reactions  . Cephalosporins Other (See Comments)    ALLERGY to PCN with immediate rash, facial/tongue/throat swelling, SOB, lightheadedness with hypotension.  . Contrast Media [Iodinated Diagnostic Agents] Hives and Shortness Of Breath  . Penicillins Hives    Has patient had a PCN reaction causing immediate rash, facial/tongue/throat swelling, SOB or lightheadedness with hypotension: Yes Has patient had a PCN reaction causing severe rash involving mucus membranes or skin necrosis: No Has patient had a PCN reaction that required hospitalization No Has patient had a PCN reaction occurring within the last 10 years: No If all of the above answers are "NO", then may proceed with Cephalosporin use.  Marland Kitchen Amoxicillin Hives  . Codeine Nausea And Vomiting  . Keflex [Cephalexin] Hives  . Iodine Rash  OBJECTIVE: Vitals:   02/02/20 0434 02/02/20 0500 02/02/20 0800 02/02/20 0804  BP: (!) 125/53  134/89   Pulse:   94   Resp:   (!) 23   Temp: 97.9 F (36.6 C)   98.3 F (36.8 C)  TempSrc:    Oral  SpO2:      Weight:  84.6 kg    Height:       Body mass index is 33.42 kg/m.  Physical Exam Constitutional:      Comments: She is more alert.  She is sitting up in bed talking with her husband.  Cardiovascular:     Rate and Rhythm:  Normal rate and regular rhythm.     Heart sounds: No murmur.  Pulmonary:     Effort: Pulmonary effort is normal.     Breath sounds: Normal breath sounds.  Musculoskeletal:        General: No swelling or tenderness.     Cervical back: Neck supple.     Comments: She has some pain with palpation and range of motion of her left shoulder.  There is no unusual swelling, redness or warmth.  Strength in her left arm and hand appears to be normal.  Skin:    Findings: No rash.  Neurological:     General: No focal deficit present.     Lab Results Lab Results  Component Value Date   WBC 24.5 (H) 02/02/2020   HGB 10.5 (L) 02/02/2020   HCT 31.5 (L) 02/02/2020   MCV 81.2 02/02/2020   PLT 434 (H) 02/02/2020    Lab Results  Component Value Date   CREATININE 0.65 02/02/2020   BUN 16 02/02/2020   NA 137 02/02/2020   K 4.2 02/02/2020   CL 106 02/02/2020   CO2 22 02/02/2020    Lab Results  Component Value Date   ALT 42 01/28/2020   AST 19 01/28/2020   ALKPHOS 96 01/28/2020   BILITOT 1.0 01/28/2020     Microbiology: Recent Results (from the past 240 hour(s))  Blood Culture (routine x 2)     Status: Abnormal   Collection Time: 01/23/20  8:29 PM   Specimen: BLOOD LEFT HAND  Result Value Ref Range Status   Specimen Description BLOOD LEFT HAND  Final   Special Requests   Final    BOTTLES DRAWN AEROBIC AND ANAEROBIC Blood Culture adequate volume   Culture  Setup Time   Final    GRAM POSITIVE COCCI IN PAIRS IN BOTH AEROBIC AND ANAEROBIC BOTTLES CRITICAL RESULT CALLED TO, READ BACK BY AND VERIFIED WITH: T. BAUMEISTER, PHARMD AT 1120 ON 01/24/20 BY C. JESSUP, MT.    Culture (A)  Final    STREPTOCOCCUS PNEUMONIAE SUSCEPTIBILITIES PERFORMED ON PREVIOUS CULTURE WITHIN THE LAST 5 DAYS. Performed at [facility], [place] [address], [place], [place] [address]    Report Status 01/26/2020 FINAL  Final  Blood Culture (routine x 2)     Status: Abnormal   Collection Time: 01/23/20  8:55 PM    Specimen: BLOOD RIGHT HAND  Result Value Ref Range Status   Specimen Description BLOOD RIGHT HAND  Final   Special Requests   Final    BOTTLES DRAWN AEROBIC AND ANAEROBIC Blood Culture results may not be optimal due to an inadequate volume of blood received in culture bottles   Culture  Setup Time   Final    GRAM POSITIVE COCCI IN PAIRS IN BOTH AEROBIC AND ANAEROBIC BOTTLES CRITICAL RESULT CALLED TO, READ BACK BY  AND VERIFIED WITH: T. Janeice Robinson, PHARMD AT 1120 ON 01/24/20 BY C. JESSUP, MT. Performed at [facility], [place] [address] [place], [place] [address]    Culture STREPTOCOCCUS PNEUMONIAE (A)  Final   Report Status 01/26/2020 FINAL  Final   Organism ID, Bacteria STREPTOCOCCUS PNEUMONIAE  Final      Susceptibility   Streptococcus pneumoniae - MIC*    ERYTHROMYCIN <=0.12 SENSITIVE Sensitive     LEVOFLOXACIN 0.5 SENSITIVE Sensitive     VANCOMYCIN 0.25 SENSITIVE Sensitive     PENICILLIN (meningitis) 0.12 RESISTANT Resistant     PENO - penicillin 0.12      PENICILLIN (non-meningitis) 0.12 SENSITIVE Sensitive     PENICILLIN (oral) 0.12 INTERMEDIATE Intermediate     CEFTRIAXONE (non-meningitis) <=0.12 SENSITIVE Sensitive     CEFTRIAXONE (meningitis) <=0.12 SENSITIVE Sensitive     * STREPTOCOCCUS PNEUMONIAE  Blood Culture ID Panel (Reflexed)     Status: Abnormal   Collection Time: 01/23/20  8:55 PM  Result Value Ref Range Status   Enterococcus species NOT DETECTED NOT DETECTED Final   Listeria monocytogenes NOT DETECTED NOT DETECTED Final   Staphylococcus species NOT DETECTED NOT DETECTED Final   Staphylococcus aureus (BCID) NOT DETECTED NOT DETECTED Final   Streptococcus species DETECTED (A) NOT DETECTED Final    Comment: CRITICAL RESULT CALLED TO, READ BACK BY AND VERIFIED WITH: T. BAUMEISTER, PHARMD AT 1120 ON 01/24/20 BY C. JESSUP, MT.    Streptococcus agalactiae NOT DETECTED NOT DETECTED Final   Streptococcus pneumoniae DETECTED (A) NOT DETECTED Final    Comment:  CRITICAL RESULT CALLED TO, READ BACK BY AND VERIFIED WITH: T. BAUMEISTER, PHARMD AT 1120 ON 01/24/20 BY C. JESSUP, MT.    Streptococcus pyogenes NOT DETECTED NOT DETECTED Final   Acinetobacter baumannii NOT DETECTED NOT DETECTED Final   Enterobacteriaceae species NOT DETECTED NOT DETECTED Final   Enterobacter cloacae complex NOT DETECTED NOT DETECTED Final   Escherichia coli NOT DETECTED NOT DETECTED Final   Klebsiella oxytoca NOT DETECTED NOT DETECTED Final   Klebsiella pneumoniae NOT DETECTED NOT DETECTED Final   Proteus species NOT DETECTED NOT DETECTED Final   Serratia marcescens NOT DETECTED NOT DETECTED Final   Haemophilus influenzae NOT DETECTED NOT DETECTED Final   Neisseria meningitidis NOT DETECTED NOT DETECTED Final   Pseudomonas aeruginosa NOT DETECTED NOT DETECTED Final   Candida albicans NOT DETECTED NOT DETECTED Final   Candida glabrata NOT DETECTED NOT DETECTED Final   Candida krusei NOT DETECTED NOT DETECTED Final   Candida parapsilosis NOT DETECTED NOT DETECTED Final   Candida tropicalis NOT DETECTED NOT DETECTED Final    Comment: Performed at [facility], [place]. [address], [place], [place] [address]  Urine culture     Status: None   Collection Time: 01/23/20  9:17 PM   Specimen: In/Out Cath Urine  Result Value Ref Range Status   Specimen Description IN/OUT CATH URINE  Final   Special Requests NONE  Final   Culture   Final    NO GROWTH Performed at [facility], [place] [address] [place], [place] [address]    Report Status 01/25/2020 FINAL  Final  SARS CORONAVIRUS 2 (TAT 6-24 HRS) Nasopharyngeal Nasopharyngeal Swab     Status: None   Collection Time: 01/23/20 11:06 PM   Specimen: Nasopharyngeal Swab  Result Value Ref Range Status   SARS Coronavirus 2 NEGATIVE NEGATIVE Final    Comment: (NOTE) SARS-CoV-2 target nucleic acids are NOT DETECTED. The SARS-CoV-2 RNA is generally  detectable in upper and lower respiratory specimens during the acute phase of  infection. Negative results do not preclude SARS-CoV-2 infection, do not rule out co-infections with other pathogens, and should not be used as the sole basis for treatment or other patient management decisions. Negative results must be combined with clinical observations, patient history, and epidemiological information. The expected result is Negative. Fact Sheet for Patients: SugarRoll.be Fact Sheet for Healthcare Providers: https://www.woods-mathews.com/ This test is not yet approved or cleared by the [place] FDA and  has been authorized for detection and/or diagnosis of SARS-CoV-2 by FDA under an Emergency Use Authorization (EUA). This EUA will remain  in effect (meaning this test can be used) for the duration of the COVID-19 declaration under Section 56 4(b)(1) of the Act, 21 U.S.C. section 360bbb-3(b)(1), unless the authorization is terminated or revoked sooner. Performed at [facility], [place] [address] [place], [place] [address]   CSF culture     Status: None   Collection Time: 01/26/20  5:37 PM   Specimen: PATH Cytology CSF; Cerebrospinal Fluid  Result Value Ref Range Status   Specimen Description CSF  Final   Special Requests NONE  Final   Gram Stain   Final    WBC PRESENT, PREDOMINANTLY PMN NO ORGANISMS SEEN CYTOSPIN SMEAR    Culture   Final    NO GROWTH 3 DAYS Performed at [facility], [place] [address] [place], [place] [address]    Report Status 01/30/2020 FINAL  Final  Culture, fungus without smear     Status: None (Preliminary result)   Collection Time: 01/26/20  5:37 PM   Specimen: CSF  Result Value Ref Range Status   Specimen Description CSF  Final   Special Requests NONE  Final   Culture   Final    NO FUNGUS ISOLATED AFTER 7 DAYS Performed at [facility], [place] [address], [place], [place] [address]    Report Status PENDING  Incomplete  Culture, blood (Routine X 2) w Reflex to ID Panel      Status: None (Preliminary result)   Collection Time: 01/29/20  3:11 PM   Specimen: BLOOD  Result Value Ref Range Status   Specimen Description BLOOD RIGHT ANTECUBITAL  Final   Special Requests   Final    BOTTLES DRAWN AEROBIC AND ANAEROBIC Blood Culture adequate volume   Culture   Final    NO GROWTH 4 DAYS Performed at [facility], [place] [address], [place], [place] [address]    Report Status PENDING  Incomplete  Culture, blood (Routine X 2) w Reflex to ID Panel     Status: None (Preliminary result)   Collection Time: 01/29/20  3:19 PM   Specimen: BLOOD RIGHT HAND  Result Value Ref Range Status   Specimen Description BLOOD RIGHT HAND  Final   Special Requests IN PEDIATRIC BOTTLE Blood Culture adequate volume  Final   Culture   Final    NO GROWTH 4 DAYS Performed at [facility], [place] [address], [place], [place] [address]    Report Status PENDING  Incomplete    Michel Bickers, MD [facility] [phone] pager   [phone] cell 02/02/2020, 12:01 PM

## 2020-02-02 NOTE — Progress Notes (Signed)
Patient HR between 98-114, patient setting on bedside alert and oriented not in distress, MD notified via secure chat, awaiting orders

## 2020-02-03 DIAGNOSIS — I350 Nonrheumatic aortic (valve) stenosis: Secondary | ICD-10-CM

## 2020-02-03 DIAGNOSIS — M533 Sacrococcygeal disorders, not elsewhere classified: Secondary | ICD-10-CM

## 2020-02-03 DIAGNOSIS — M25512 Pain in left shoulder: Secondary | ICD-10-CM

## 2020-02-03 DIAGNOSIS — I444 Left anterior fascicular block: Secondary | ICD-10-CM | POA: Diagnosis present

## 2020-02-03 DIAGNOSIS — I447 Left bundle-branch block, unspecified: Secondary | ICD-10-CM

## 2020-02-03 LAB — GLUCOSE, CAPILLARY
Glucose-Capillary: 117 mg/dL — ABNORMAL HIGH (ref 70–99)
Glucose-Capillary: 93 mg/dL (ref 70–99)
Glucose-Capillary: 94 mg/dL (ref 70–99)
Glucose-Capillary: 98 mg/dL (ref 70–99)

## 2020-02-03 LAB — CBC
HCT: 30.1 % — ABNORMAL LOW (ref 36.0–46.0)
Hemoglobin: 10.1 g/dL — ABNORMAL LOW (ref 12.0–15.0)
MCH: 27.6 pg (ref 26.0–34.0)
MCHC: 33.6 g/dL (ref 30.0–36.0)
MCV: 82.2 fL (ref 80.0–100.0)
Platelets: 474 10*3/uL — ABNORMAL HIGH (ref 150–400)
RBC: 3.66 MIL/uL — ABNORMAL LOW (ref 3.87–5.11)
RDW: 15.9 % — ABNORMAL HIGH (ref 11.5–15.5)
WBC: 20.2 10*3/uL — ABNORMAL HIGH (ref 4.0–10.5)
nRBC: 0 % (ref 0.0–0.2)

## 2020-02-03 LAB — CULTURE, BLOOD (ROUTINE X 2)
Culture: NO GROWTH
Culture: NO GROWTH
Special Requests: ADEQUATE
Special Requests: ADEQUATE

## 2020-02-03 MED ORDER — METOPROLOL TARTRATE 25 MG PO TABS
37.5000 mg | ORAL_TABLET | Freq: Two times a day (BID) | ORAL | Status: DC
  Administered 2020-02-03 – 2020-02-05 (×4): 37.5 mg via ORAL
  Filled 2020-02-03 (×4): qty 1

## 2020-02-03 MED ORDER — DICLOFENAC SODIUM 1 % EX GEL
2.0000 g | Freq: Four times a day (QID) | CUTANEOUS | Status: DC
  Administered 2020-02-03 – 2020-02-05 (×11): 2 g via TOPICAL
  Filled 2020-02-03: qty 100

## 2020-02-03 MED ORDER — TRAMADOL HCL 50 MG PO TABS
50.0000 mg | ORAL_TABLET | Freq: Four times a day (QID) | ORAL | Status: DC | PRN
  Administered 2020-02-03 – 2020-02-05 (×2): 50 mg via ORAL
  Filled 2020-02-03 (×2): qty 1

## 2020-02-03 NOTE — Progress Notes (Signed)
Pharmacy Antibiotic Note  Kathleen Duran is a 69 y.o. female admitted on 01/23/2020 with strep pneumoniae bacteremia and meningitis.  Pharmacy has been consulted for meropenem dosing.  Plan: Continue meropenem 2gm IV q8h until 4/8 for 14 days of therapy.   Height: 5' 2.64" (159.1 cm) Weight: 84.6 kg (186 lb 8.2 oz) IBW/kg (Calculated) : 51.57  Temp (24hrs), Avg:98.6 F (37 C), Min:98.1 F (36.7 C), Max:99 F (37.2 C)  Recent Labs  Lab 01/29/20 0759 01/29/20 0759 01/30/20 0554 01/31/20 0509 02/01/20 0515 02/02/20 0438 02/03/20 0332  WBC 31.1*   < > 25.0* 28.1* 24.8* 24.5* 20.2*  CREATININE 3.91*  --  1.12* 0.81 0.80 0.65  --    < > = values in this interval not displayed.    Estimated Creatinine Clearance: 67.9 mL/min (by C-G formula based on SCr of 0.65 mg/dL).    Allergies  Allergen Reactions  . Cephalosporins Other (See Comments)    Patient has had hives and rash to cephalexin. Last time patient has received a cephalosporin type antibiotic was prior to the year 2000.  Marland Kitchen Contrast Media [Iodinated Diagnostic Agents] Hives and Shortness Of Breath  . Penicillins Hives    Patient has had hives and rash to amoxicillin and penicillin. Last time patient has received a penicillin type antibiotic was prior to the year 2000.  Marland Kitchen Codeine Nausea And Vomiting  . Iodine Rash    Antimicrobials this admission: 3/26 meropenem >> (4/8)  Dose adjustments this admission: Meropenem q12h>>q8h  Microbiology results: 3/26 BCx: Strep pneumoniae   Loys Shugars A. Levada Dy, PharmD, BCPS, FNKF Clinical Pharmacist [place] Please utilize Amion for appropriate phone number to reach the unit pharmacist ([place])   02/03/2020 9:23 AM

## 2020-02-03 NOTE — Progress Notes (Signed)
[facility]  PROGRESS NOTE  Kathleen Duran [id number] DOB: 11/02/1950 DOA: 01/23/2020 PCP: Arlyss Repress, MD Admit date - 01/23/2020   Admitting Physician Kerney Elbe, DO  Outpatient Primary MD for the patient is Arlyss Repress, MD  LOS - 11 Brief Narrative   Kathleen Duran is a 69 y.o. year old female with medical history significant forpromyelocytic leukemia under remission, hypertension, GERD  who presented on 01/23/2020 with fever, sudden confusion x 24 hours and vomiting and was found to have nuchal rigidity, leukocytosis of 70,000, and creatinine of 3.09 (normal in October) and admitted with working diagnosis of sepsis secondary to presumed meningitis.  Underwent unsuccessful LP in ED and started on empiric antibiotics for meningitis.  Hospital course complicated by altered mental status concern for meningitis based on MRI brain imaging.  Patient underwent LP on 3/29 which showed markedly elevated WBC of greater than 30,000 predominant neutrophilic, elevated protein of 292, and depressed glucose consistent with bacterial meningitis.HSV1/2 negative. DC'd vancomycin, ampicillin, and acyclovir on 3/30 given likely etiology is strep pna bacteremia presumed from pansinusitis. ID consulted an agreed  Subjective  Today complains of pain in her bottom, and shoulder pain on the left side.  A & P   Altered mental status secondary to meningitis, likely strep pneumo given concomitant S.pna bacteremia, resolved Mental status back to baseline still having neck pain and headache but no focal deficits.  H.  Remains afebrile, based off of LP likely bacterial, given strep pneumo in blood this is likely etiology no growth on CSF fluid culture -Currently on meropenem, to continue for 14 days per ID recommendations, end date 4/8 -Patient no longer under IVC, is a status alert and oriented x4.  Sepsis secondary to Strep pneumo bacteremia and Strep pna meningitis/encephalitis and  AKI, resolved All stemming from meningitis related to strep pneumo/bacteremia in setting of splenectomy.  Question if sinusitis was nidus for meningitis based on CT head imaging. Afebrile for last 24 hours, repeat blood cultures seem to be showing clearance, TTE with no vegetations, no other obvious signs of infection. -Appreciate ID recs, continue meropenem for total of 14 days given severity of her infection and presence of pansinusitis -Daily CBC -Repeat blood cultures on 4/1 documented clearance  Sinus tachycardia. Has LBBB on admission, per EKG in [place] shows chronic left anterior fascicular block, improving.  Remains asymptomatic. Is net negative on admission, tolerating po intake but not much, does not look significantly dehydrated on exam, no longer septic -Off IV fluids encourage p.o. intake --increase Lopressor to 37.5 mg twice daily (takes 50 mg qd at home) -Monitor on telemetry  Left shoulder pain, reproducible on exam.  X-ray nonacute. -Diclofenac gel topical 4 times daily  Coccygeal pain.  Has pressure injury grade 1, with some unroofing of skin. -Continue Mepilex dressing -Continue to offload, turn as needed -Warm compresses to the apply to area --Tramadol PRN pain  Mild aortic stenosis. Confirmed on TTE no vegetations. Mean gradient 11 mm Hg -ID does not recommend TEE --outpatient follow up with [facility]  Diastolic grade 1 CHF. On TTE. With mild AS above noted. No O2 requirements, doesn't look volume overload. CXR on 3/30 with no congeston - monitor output - monitor weights  Elevated LFTs, acute liver injury in the setting of sepsis, resolved Peak ALT of 240, and AST 161.  Now back to normal baseline. --held home atorvastatin on admission  Discrete skin discoloration on left leg, that extends to upper abdomen, stable Husband reports  this is a birthmark at bedside.  -Closely monitor  LTM concerning for epileptogenicity and cortical dysfunction in  left frontotemporal region No definite seizures seen on LTM EEG recording -neurology recommends continuing Keppra 750 mg twice daily,   Hypernatremia, in the setting of diminished p.o. intake, resolved -Currently on D5 half-normal/expect improvement now that patient able to tolerate diet (dysphagia 2 diet) -Repeat BMP in a.m.  AKI, resolved. Multifactorial etiology: contrast/diminished p.o. intake/urinary retention/briefly on vancomycin. Peak Cr  3.91 on 4/1 now back to baseline. Responded well to IVF  -Avoid nephrotoxins (vancomycin has been DC'd) -foley in place -RUS showed mild bilateral pelviectasis without hydronephrosis,  monitor output  -Daily BMP -Holding home irbesartan  Mild Bilateral Pelviectasis without hydronephrosis. Found on RUS during workup for AKI as patient was found to have acute urinary retention requiring foley.  -voiding trial today  HTN, at goal -lopressor increased to 37.5 mg BID ( home regimen 50 mg qd ) -Hold home irbesartan given AKI on admission and currently normotensive  GERD, stable -Continue home PPI  History of B-cell prolymphocytic leukemia, status post splenectomy and chemotherapy Evaluated by oncology this admission, per their review peripheral smear seems more reactive, doubt relapse, likely all related to meningitis above.   --Given splenectomy at high risk for encapsulated organism infections  Interstitial lung disease, chronic interstitial pneumonitis.  On room air, no respiratory symptomsNo history of structural lung disease.  Undergoing work-up for hypersensitivity pneumonitis as outpatient. No longer on prednisone - Followed by [facility] --incentive spirometry, flutter valve, Out of bed to chair with assistance    Family Communication  : Husband updated at bedside on 4/6  Code Status : Full  Disposition Plan  :  Patient is from home. Anticipated d/c date:  3-4 days. Barriers to d/c or necessity for inpatient  status:  Continue IV meropenem for bacterial meningitis to continue for total of 14 days, end date 4/8 per ID. PT recommends CIR, patient will discuss today with CIR Consults  : Neurology, oncology, ID  Procedures  : 3/29, LP under fluoroscopy  DVT Prophylaxis  :  Lovenox  Lab Results  Component Value Date   PLT 474 (H) 02/03/2020    Diet :  Diet Order            Diet regular Room service appropriate? Yes with Assist; Fluid consistency: Thin  Diet effective now               Inpatient Medications Scheduled Meds: . chlorhexidine  15 mL Mouth Rinse BID  . Chlorhexidine Gluconate Cloth  6 each Topical Daily  . diclofenac Sodium  2 g Topical QID  . enoxaparin (LOVENOX) injection  40 mg Subcutaneous Q24H  . feeding supplement (ENSURE ENLIVE)  237 mL Oral TID BM  . levETIRAcetam  750 mg Oral BID  . mouth rinse  15 mL Mouth Rinse q12n4p  . metoprolol tartrate  25 mg Oral BID  . multivitamin with minerals  1 tablet Oral Daily  . pantoprazole  40 mg Oral Daily  . sodium chloride flush  10-40 mL Intracatheter Q12H   Continuous Infusions: . sodium chloride    . sodium chloride 500 mL (02/01/20 0811)  . meropenem (MERREM) IV 2 g (02/03/20 0349)   PRN Meds:.sodium chloride, sodium chloride, acetaminophen, haloperidol lactate, labetalol, levalbuterol, LORazepam, ondansetron **OR** ondansetron (ZOFRAN) IV, sodium chloride flush  Antibiotics  :   Anti-infectives (From admission, onward)   Start     Dose/Rate Route Frequency  Ordered Stop   01/31/20 1000  meropenem (MERREM) 2 g in sodium chloride 0.9 % 100 mL IVPB     2 g 200 mL/hr over 30 Minutes Intravenous Every 8 hours 01/31/20 0939 02/05/20 2359   01/30/20 1400  meropenem (MERREM) 2 g in sodium chloride 0.9 % 100 mL IVPB  Status:  Discontinued     2 g 200 mL/hr over 30 Minutes Intravenous Every 12 hours 01/30/20 1003 01/31/20 0939   01/29/20 1400  meropenem (MERREM) 1 g in sodium chloride 0.9 % 100 mL IVPB  Status:   Discontinued     1 g 200 mL/hr over 30 Minutes Intravenous Every 12 hours 01/29/20 1020 01/30/20 1003   01/28/20 1400  meropenem (MERREM) 2 g in sodium chloride 0.9 % 100 mL IVPB  Status:  Discontinued     2 g 200 mL/hr over 30 Minutes Intravenous Every 12 hours 01/28/20 1039 01/29/20 1020   01/25/20 1200  acyclovir (ZOVIRAX) 610 mg in dextrose 5 % 100 mL IVPB  Status:  Discontinued     610 mg 112.2 mL/hr over 60 Minutes Intravenous Every 8 hours 01/25/20 0924 01/25/20 0933   01/25/20 1000  meropenem (MERREM) 2 g in sodium chloride 0.9 % 100 mL IVPB  Status:  Discontinued     2 g 200 mL/hr over 30 Minutes Intravenous Every 8 hours 01/25/20 0919 01/28/20 1039   01/25/20 1000  vancomycin (VANCOREADY) IVPB 750 mg/150 mL  Status:  Discontinued     750 mg 150 mL/hr over 60 Minutes Intravenous Every 12 hours 01/25/20 0919 01/27/20 1547   01/24/20 0230  acyclovir (ZOVIRAX) 680 mg in dextrose 5 % 100 mL IVPB  Status:  Discontinued     680 mg 113.6 mL/hr over 60 Minutes Intravenous Every 24 hours 01/24/20 0137 01/25/20 0924   01/23/20 2200  meropenem (MERREM) 1 g in sodium chloride 0.9 % 100 mL IVPB  Status:  Discontinued     1 g 200 mL/hr over 30 Minutes Intravenous Every 8 hours 01/23/20 2042 01/23/20 2044   01/23/20 2200  meropenem (MERREM) 2 g in sodium chloride 0.9 % 100 mL IVPB  Status:  Discontinued     2 g 200 mL/hr over 30 Minutes Intravenous Every 8 hours 01/23/20 2044 01/23/20 2052   01/23/20 2100  meropenem (MERREM) 1 g in sodium chloride 0.9 % 100 mL IVPB  Status:  Discontinued     1 g 200 mL/hr over 30 Minutes Intravenous Every 12 hours 01/23/20 2052 01/25/20 0919   01/23/20 2055  vancomycin variable dose per unstable renal function (pharmacist dosing)  Status:  Discontinued      Does not apply See admin instructions 01/23/20 2056 01/25/20 0934   01/23/20 2045  vancomycin (VANCOREADY) IVPB 1750 mg/350 mL     1,750 mg 175 mL/hr over 120 Minutes Intravenous  Once 01/23/20 2042  01/24/20 0010       Objective   Vitals:   02/02/20 2359 02/03/20 0400 02/03/20 0805 02/03/20 1159  BP: (!) 128/49 (!) 138/59 (!) 152/89 (!) 146/72  Pulse: 92 96 (!) 108 (!) 104  Resp: (!) 23 20 16 20   Temp: 99 F (37.2 C) 98.1 F (36.7 C) 98.8 F (37.1 C)   TempSrc: Oral Oral Oral   SpO2: 99% 99% 100% 97%  Weight:      Height:        SpO2: 97 % O2 Flow Rate (L/min): 2 L/min  Wt Readings from Last 3 Encounters:  02/02/20 84.6  kg  04/22/18 92.6 kg  04/17/16 95.4 kg     Intake/Output Summary (Last 24 hours) at 02/03/2020 1238 Last data filed at 02/03/2020 0403 Gross per 24 hour  Intake 480 ml  Output 3300 ml  Net -2820 ml    Physical Exam:  Awake Alert, Oriented X to self, place, time context Follows all commands.Moves all extremities against gravity Decreased strength in left arm ( but improved from prior exam) [place].AT, Normal respiratory effort on , CTAB Tachycardic, SEM heard, no edema,  +ve B.Sounds, Abd Soft, No tenderness, No rebound, guarding or rigidity. Skin intact except mild unroofing of skin on coccyx area with mepilex dressing in place that is clean, dry, and intact Left Leg         I have personally reviewed the following:   Data Reviewed:  CBC Recent Labs  Lab 01/28/20 0421 01/29/20 0759 01/30/20 0554 01/31/20 0509 02/01/20 0515 02/02/20 0438 02/03/20 0332  WBC 24.4*   < > 25.0* 28.1* 24.8* 24.5* 20.2*  HGB 13.4   < > 11.3* 10.9* 10.4* 10.5* 10.1*  HCT 40.4   < > 34.1* 32.8* 31.6* 31.5* 30.1*  PLT 187   < > 212 280 340 434* 474*  MCV 81.1   < > 81.0 81.6 81.4 81.2 82.2  MCH 26.9   < > 26.8 27.1 26.8 27.1 27.6  MCHC 33.2   < > 33.1 33.2 32.9 33.3 33.6  RDW 17.1*   < > 16.5* 16.2* 16.2* 16.0* 15.9*  LYMPHSABS 1.5  --   --   --   --   --   --   MONOABS 6.7*  --   --   --   --   --   --   EOSABS 0.2  --   --   --   --   --   --   BASOSABS 0.1  --   --   --   --   --   --    < > = values in this interval not displayed.     Chemistries  Recent Labs  Lab 01/28/20 0421 01/28/20 0421 01/29/20 0759 01/30/20 0554 01/31/20 0509 02/01/20 0515 02/01/20 2220 02/02/20 0438  NA 152*   < > 146* 144 141 139  --  137  K 3.6   < > 4.1 4.1 3.7 3.6  --  4.2  CL 118*   < > 114* 115* 110 108  --  106  CO2 20*   < > 17* 18* 21* 21*  --  22  GLUCOSE 136*   < > 124* 98 117* 92  --  109*  BUN 48*   < > 77* 37* 22 16  --  16  CREATININE 1.95*   < > 3.91* 1.12* 0.81 0.80  --  0.65  CALCIUM 8.7*   < > 8.5* 8.3* 8.1* 8.1*  --  8.2*  MG 2.3  --   --   --   --   --  1.7 1.7  AST 19  --   --   --   --   --   --   --   ALT 42  --   --   --   --   --   --   --   ALKPHOS 96  --   --   --   --   --   --   --   BILITOT 1.0  --   --   --   --   --   --   --    < > =  values in this interval not displayed.   ------------------------------------------------------------------------------------------------------------------ No results for input(s): CHOL, HDL, LDLCALC, TRIG, CHOLHDL, LDLDIRECT in the last 72 hours.  No results found for: HGBA1C ------------------------------------------------------------------------------------------------------------------ No results for input(s): TSH, T4TOTAL, T3FREE, THYROIDAB in the last 72 hours.  Invalid input(s): FREET3 ------------------------------------------------------------------------------------------------------------------ No results for input(s): VITAMINB12, FOLATE, FERRITIN, TIBC, IRON, RETICCTPCT in the last 72 hours.  Coagulation profile No results for input(s): INR, PROTIME in the last 168 hours.  No results for input(s): DDIMER in the last 72 hours.  Cardiac Enzymes No results for input(s): CKMB, TROPONINI, MYOGLOBIN in the last 168 hours.  Invalid input(s): CK ------------------------------------------------------------------------------------------------------------------ No results found for: BNP  Micro Results Recent Results (from the past 240 hour(s))  CSF  culture     Status: None   Collection Time: 01/26/20  5:37 PM   Specimen: PATH Cytology CSF; Cerebrospinal Fluid  Result Value Ref Range Status   Specimen Description CSF  Final   Special Requests NONE  Final   Gram Stain   Final    WBC PRESENT, PREDOMINANTLY PMN NO ORGANISMS SEEN CYTOSPIN SMEAR    Culture   Final    NO GROWTH 3 DAYS Performed at [facility], [address], [place], [place] [address]    Report Status 01/30/2020 FINAL  Final  Culture, fungus without smear     Status: None (Preliminary result)   Collection Time: 01/26/20  5:37 PM   Specimen: CSF  Result Value Ref Range Status   Specimen Description CSF  Final   Special Requests NONE  Final   Culture   Final    NO FUNGUS ISOLATED AFTER 7 DAYS Performed at [facility], [place] [address] [place], [place] [address]    Report Status PENDING  Incomplete  Culture, blood (Routine X 2) w Reflex to ID Panel     Status: None   Collection Time: 01/29/20  3:11 PM   Specimen: BLOOD  Result Value Ref Range Status   Specimen Description BLOOD RIGHT ANTECUBITAL  Final   Special Requests   Final    BOTTLES DRAWN AEROBIC AND ANAEROBIC Blood Culture adequate volume   Culture   Final    NO GROWTH 5 DAYS Performed at [facility], [place] [address], [place], [place] [address]    Report Status 02/03/2020 FINAL  Final  Culture, blood (Routine X 2) w Reflex to ID Panel     Status: None   Collection Time: 01/29/20  3:19 PM   Specimen: BLOOD RIGHT HAND  Result Value Ref Range Status   Specimen Description BLOOD RIGHT HAND  Final   Special Requests IN PEDIATRIC BOTTLE Blood Culture adequate volume  Final   Culture   Final    NO GROWTH 5 DAYS Performed at [facility], [place] [address] [place], [place] [address]    Report Status 02/03/2020 FINAL  Final    Radiology Reports CT Abdomen Pelvis Wo Contrast  Result Date: 01/23/2020 CLINICAL DATA:  Patient appears jaundiced history of lymphoma EXAM: CT ABDOMEN  AND PELVIS WITHOUT CONTRAST TECHNIQUE: Multidetector CT imaging of the abdomen and pelvis was performed following the standard protocol without IV contrast. COMPARISON:  None. FINDINGS: Lower chest: The visualized heart size within normal limits. No pericardial fluid/thickening. No hiatal hernia. The visualized portions of the lungs are clear. Hepatobiliary: Although limited due to the lack of intravenous contrast, normal in appearance without gross focal abnormality. The patient is status post cholecystectomy. No biliary ductal  dilation. Pancreas:  Unremarkable.  No surrounding inflammatory changes. 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 Adrenals/Urinary Tract: Both adrenal glands appear normal. The kidneys and collecting system appear normal without evidence of urinary tract calculus or hydronephrosis. Bladder is unremarkable. Stomach/Bowel: The stomach, small bowel, and colon are normal in appearance. No inflammatory changes or obstructive findings. Scattered colonic diverticula are noted without diverticulitis. Appendix is normal. Vascular/Lymphatic: There are no enlarged abdominal or pelvic lymph nodes. Scattered aortic atherosclerotic calcifications are seen without aneurysmal dilatation. Reproductive: The uterus and adnexa are unremarkable. Other: No evidence of abdominal wall mass or hernia. Musculoskeletal: No acute or significant osseous findings. IMPRESSION: No acute intra-abdominal or pelvic pathology to explain the patient's symptoms. Diverticulosis without diverticulitis. Aortic Atherosclerosis (ICD10-I70.0). Electronically Signed   By: Prudencio Pair M.D.   On: 01/23/2020 22:03   CT Head Wo Contrast  Result Date: 01/23/2020 CLINICAL DATA:  Altered mental status. Left facial droop. EXAM: CT HEAD WITHOUT CONTRAST TECHNIQUE: Contiguous axial images were obtained from the base of the skull through the vertex without intravenous contrast.  COMPARISON:  Brain MRI 11/24/2019 FINDINGS: Brain: No acute hemorrhage. No extra-axial or subdural collection. Mild generalized atrophy with moderate periventricular and deep white matter hypodensity consistent with chronic small vessel ischemia. This appears slightly asymmetric in the right cerebral hemisphere but no definite cortical infarct. Small calcification in left basal ganglia, likely senescent. No midline shift, hydrocephalus or suspicious mass effect. Vascular: Atherosclerosis of skullbase vasculature without hyperdense vessel or abnormal calcification. Skull: No fracture or focal lesion. Sinuses/Orbits: Chronic paranasal sinus disease with near complete opacification of frontal sinuses, ethmoid air cells, left sphenoid and maxillary sinus. Mucosal thickening in the right sphenoid and maxillary sinus. The degree sinus opacification has slightly progressed from prior MRI. Mastoid air cells are clear. Other: None. IMPRESSION: 1. No hemorrhage or evidence of acute infarct. 2. Mild atrophy with moderate chronic small vessel ischemia. 3. Chronic paranasal sinus disease, slightly progressed from January MRI. Electronically Signed   By: Keith Rake M.D.   On: 01/23/2020 22:02   MR BRAIN WO CONTRAST  Result Date: 01/24/2020 CLINICAL DATA:  Initial evaluation for acute encephalopathy, altered mental status. History of leukemia, in remission. EXAM: MRI HEAD WITHOUT CONTRAST TECHNIQUE: Multiplanar, multiecho pulse sequences of the brain and surrounding structures were obtained without intravenous contrast. COMPARISON:  Prior CT from 01/23/2020 as well as previous MRI from 11/24/2019. FINDINGS: Brain: Examination somewhat technically limited by susceptibility artifact emanating from the scalp. Generalized age-related cerebral atrophy. Patchy T2/FLAIR hyperintensity within the periventricular and deep white matter both cerebral hemispheres again seen, nonspecific, but most like related chronic microvascular  ischemic disease, stable from previous. No abnormal foci of restricted diffusion to suggest acute or subacute ischemia. Gray-white matter differentiation maintained. No encephalomalacia to suggest chronic cortical infarction. Diffuse FLAIR hyperintensity seen throughout the cerebral sulci, suspected to be artifactual nature due to overlying susceptibility artifact. Possible proteinaceous material not entirely excluded. No associated susceptibility artifact to suggest subarachnoid hemorrhage. No mass lesion, midline shift, or mass effect. Ventricles normal size without hydrocephalus. No appreciable intraventricular debris. No extra-axial fluid collection. No made of an empty sella. Midline structures intact. Vascular: Major intracranial vascular flow voids are maintained. Skull and upper cervical spine: Craniocervical junction within normal limits. Postsurgical changes partially visualize within the upper cervical spine. Bone marrow signal intensity heterogeneous and diffusely decreased on T1 weighted imaging, most like related history of leukemia. Susceptibility artifact involving the left  greater than right scalp, of uncertain etiology. Sinuses/Orbits: Globes and orbital soft tissues demonstrate no acute finding. Extensive opacity and mucosal thickening seen throughout the paranasal sinuses, with superimposed right maxillary sinus air-fluid level. Findings could be infectious and/or inflammatory nature. Trace bilateral mastoid effusions. Visualized inner ear structures grossly normal. Other: None. IMPRESSION: 1. Technically limited exam due to susceptibility artifact emanating from the scalp, of uncertain etiology. 2. Diffuse FLAIR hyperintensity throughout the cerebral sulci and surrounding the brainstem and cerebellum. While this finding could be artifactual in nature related to overlying susceptibility artifact, proteinaceous material and changes related to meningitis could also have this appearance. Correlation  with LP and CSF studies recommended for correlated purposes as clinically warranted. No susceptibility artifact to suggest subarachnoid hemorrhage. 3. 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 4. Extensive pan sinusitis. This could be either infectious or inflammatory in nature. Electronically Signed   By: Jeannine Boga M.D.   On: 01/24/2020 06:46   US RENAL  Result Date: 01/29/2020 CLINICAL DATA:  Acute kidney injury EXAM: RENAL / URINARY TRACT ULTRASOUND COMPLETE COMPARISON:  None. FINDINGS: Right Kidney: Renal measurements: 11.1 x 6.2 x 6.6 cm = volume: Is 236 mL. There is mild pelviectasis without frank hydronephrosis. Left Kidney: Renal measurements: 12 x 6.5 x 6.3 cm = volume: 255 mL. There is mild pelviectasis without frank hydronephrosis. Bladder: Only the left ureteral jet was visualized. Other: None. IMPRESSION: 1. Mild bilateral pelviectasis without evidence for frank hydronephrosis. 2. Only the left ureteral jet was visualized on today's exam. 3. No shadowing echogenic stones identified on this study. Electronically Signed   By: Constance Holster M.D.   On: 01/29/2020 16:46   IR Fluoro Guide Ndl Plmt / BX  Result Date: 01/27/2020 CLINICAL DATA:  Confusion, altered mental status EXAM: DIAGNOSTIC LUMBAR PUNCTURE UNDER FLUOROSCOPIC GUIDANCE FLUOROSCOPY TIME:  0.1 minute; 12  uGym2 DAP PROCEDURE: Informed consent was obtained from the family prior to the procedure, including potential complications of headache, allergy, and pain. With the patient prone, the lower back was prepped with Betadine. Intravenous Fentanyl 39mcg and Versed 1mg  were administered as conscious sedation during continuous monitoring of the patient's level of consciousness and physiological / cardiorespiratory status by the radiology RN, with a total moderate sedation time of 35 minutes. 1% Lidocaine was used for local anesthesia. Lumbar  puncture was performed at the L3-4 level from a right parasagittal approach using a 20 gauge needle with return of yellow cloudy CSF with an opening pressure of 13 cm water. 9 ml of CSF were obtained for laboratory studies. The patient tolerated the procedure well and there were no apparent complications. IMPRESSION: 1. Technically successful lumbar puncture under fluoroscopy Electronically Signed   By: Lucrezia Europe M.D.   On: 01/27/2020 07:59   DG CHEST PORT 1 VIEW  Result Date: 01/27/2020 CLINICAL DATA:  Shortness of breath. History of leukemia. EXAM: PORTABLE CHEST 1 VIEW COMPARISON:  01/27/2020 FINDINGS: Midline trachea. Borderline cardiomegaly. Atherosclerosis in the transverse aorta. No pleural effusion or pneumothorax. Surgical clips in the left upper quadrant. No congestive failure. No lobar consolidation. IMPRESSION: Borderline cardiomegaly, without acute disease. Aortic Atherosclerosis (ICD10-I70.0). Electronically Signed   By: Abigail Miyamoto M.D.   On: 01/27/2020 19:16   DG CHEST PORT 1 VIEW  Result Date: 01/27/2020 CLINICAL DATA:  SOB (shortness of breath) EXAM: PORTABLE CHEST - 1 VIEW COMPARISON:  the previous day's study FINDINGS: Relatively low lung volumes with resultant crowding  of bronchovascular structures. The mild interstitial edema seen previously continues to improve. No new airspace disease. Heart size within normal limits for technique. Aortic Atherosclerosis (ICD10-170.0). No effusion. No pneumothorax. Cervical fixation hardware partially visualized. Surgical clips in the upper abdomen. IMPRESSION: Improving interstitial edema. Electronically Signed   By: Lucrezia Europe M.D.   On: 01/27/2020 11:55   DG CHEST PORT 1 VIEW  Result Date: 01/26/2020 CLINICAL DATA:  Leukocytosis EXAM: PORTABLE CHEST 1 VIEW COMPARISON:  01/24/2020 FINDINGS: The cardiac silhouette, mediastinal and hilar contours are stable. Low lung volumes with vascular crowding and streaky atelectasis. Slight improved lung  aeration since the prior study with less vascular congestion and resolving edema. No pleural effusions. IMPRESSION: 1. Low lung volumes with vascular crowding and streaky atelectasis. 2. Improved lung aeration. Electronically Signed   By: Marijo Sanes M.D.   On: 01/26/2020 07:34   DG CHEST PORT 1 VIEW  Result Date: 01/24/2020 CLINICAL DATA:  Shortness of breath and hypoxia. EXAM: PORTABLE CHEST 1 VIEW COMPARISON:  Radiograph yesterday. FINDINGS: Lower lung volumes from prior exam. Prominent heart size likely accentuated by technique. Bronchovascular crowding versus vascular congestion. No significant pleural effusion. No pneumothorax. No confluent airspace disease. No acute osseous abnormalities are seen. IMPRESSION: Lower lung volumes from prior exam. Bronchovascular crowding versus vascular congestion. Prominent heart size likely accentuated by technique and low lung volumes. Electronically Signed   By: Keith Rake M.D.   On: 01/24/2020 19:19   DG Chest Port 1 View  Result Date: 01/23/2020 CLINICAL DATA:  69 year old female with sepsis. EXAM: PORTABLE CHEST 1 VIEW COMPARISON:  Chest radiographs 12/19/2018 and earlier. FINDINGS: Portable AP semi upright view at 2045 hours. Larger lung volumes. Cardiac size at the upper limits of normal. Calcified aortic atherosclerosis. Other mediastinal contours are within normal limits. Visualized tracheal air column is within normal limits. Allowing for portable technique the lungs are clear. Chronic left upper quadrant surgical clips and cervical ACDF. Negative visible bowel gas pattern. No acute osseous abnormality identified. IMPRESSION: No acute cardiopulmonary abnormality. Aortic Atherosclerosis (ICD10-I70.0). Electronically Signed   By: Genevie Ann M.D.   On: 01/23/2020 20:55   DG Shoulder Left  Result Date: 02/02/2020 CLINICAL DATA:  Left shoulder pain. EXAM: LEFT SHOULDER - 2+ VIEW COMPARISON:  December 19, 2018. FINDINGS: There is no evidence of fracture or  dislocation. There is no evidence of arthropathy or other focal bone abnormality. Soft tissues are unremarkable. IMPRESSION: Negative. Electronically Signed   By: Marijo Conception M.D.   On: 02/02/2020 16:31   EEG adult  Result Date: 01/24/2020 Lora Havens, MD     01/24/2020 10:59 AM Patient Name: RAHI UMHOLTZ MRN: [id number] Epilepsy Attending: Lora Havens Referring Physician/Provider: Dr Amie Portland Date: 01/24/2020 Duration: 24.09 mins Patient history: 69 y.o.femalewith past medical history significant for leukemia, non-Hodgkin's lymphoma 2002 presented with altered mental status, fever, significantly elevated leukocytosis, with a rightward gaze preference. EEG to evaluate for status epilepticus. Level of alertness: lethargic AEDs during EEG study: Ativan, Keppra Technical aspects: This EEG study was done with scalp electrodes positioned according to the 10-20 International system of electrode placement. Electrical activity was acquired at a sampling rate of 500Hz  and reviewed with a high frequency filter of 70Hz  and a low frequency filter of 1Hz . EEG data were recorded continuously and digitally stored. DESCRIPTION: EEG showed continuous 3-4Hz  delta slowing in right hemisphere. There is also continuous 5-8Hz  theta-alpha activity in left hemisphere with intermittent 2-3hz  delta activity..  Hyperventilation and  photic stimulation were not performed. ABNORMALITY - Continuous slow, generalized and lateralized right hemisphere IMPRESSION: This study is suggestive of cortical dysfunction in right hemisphere likely secondary to post-ictal state, underlying structural abnormality.  No seizures or definite epileptiform discharges were seen throughout the recording. Dr Rory Percy was notified. Priyanka Barbra Sarks   Overnight EEG with video  Result Date: 01/24/2020 Lora Havens, MD     01/25/2020 10:07 AM Patient Name: YULIANA PENDERGAST MRN: [id number] Epilepsy Attending: Lora Havens Referring  Physician/Provider: Dr Amie Portland Duration: 01/24/2020 [id number] to 01/25/2020 [id number]  Patient history: 69 y.o.femalewith past medical history significant for leukemia, non-Hodgkin's lymphoma 2002 presented with altered mental status, fever, significantly elevated leukocytosis, with a rightward gaze preference. EEG to evaluate for status epilepticus.  Level of alertness: lethargic  AEDs during EEG study: Ativan, Keppra  Technical aspects: This EEG study was done with scalp electrodes positioned according to the 10-20 International system of electrode placement. Electrical activity was acquired at a sampling rate of 500Hz  and reviewed with a high frequency filter of 70Hz  and a low frequency filter of 1Hz . EEG data were recorded continuously and digitally stored.  DESCRIPTION: EEG showed continuous 5-8Hz  theta-alpha activity in left hemisphere with intermittent 2-3hz  delta activity which at times appears quasi rhythmic. Sharp waves were also noted in left frontal region. Continuous 3-4Hz  low amplitude delta slowing was also noted in right hemisphere. Hyperventilation and photic stimulation were not performed.  ABNORMALITY - Sharp waves, left frontotemporal region - Intermittent rhythmic delta activity, left frontal region - Continuous slow, generalized and lateralized right hemisphere  IMPRESSION: This study is suggestive of epileptogenicity and cortical dysfunction in left frontotemporal region. Quasi rhythmic delta activity with sharp waves in left frontotemporal region can be on the ictal-interictal continuum. Additionally, there is evidence of cortical dysfunction in right hemisphere likely secondary to post-ictal state, underlying structural abnormality.  No definite seizures were seen throughout the recording. Lora Havens   ECHOCARDIOGRAM COMPLETE  Result Date: 01/30/2020    ECHOCARDIOGRAM REPORT   Patient Name:   ATHALEE GARVER Houchen Date of Exam: 01/30/2020 Medical Rec #:  [id number]            Height:        62.6 in Accession #:    [id number]           Weight:       176.4 lb Date of Birth:  07/29/1951             BSA:          1.826 m Patient Age:    83 years             BP:           127/49 mmHg Patient Gender: F                    HR:           99 bpm. Exam Location:  Inpatient Procedure: 2D Echo Indications:    Bacteremia 790.7 / R78.81  History:        Patient has no prior history of Echocardiogram examinations.                 Risk Factors:Hypertension. Acute kidney                 Sinus tachycardia                 Sepsis  Meningitis.  Sonographer:    Vikki Ports Turrentine Referring Phys: [id number] [place]  1. Normal LV systolic function; grade 1 diastolic dysfunction; very mild AS (mean gradient 11 mmHg); mild AI; moderate LAE.  2. Left ventricular ejection fraction, by estimation, is 50 to 55%. The left ventricle has low normal function. The left ventricle has no regional wall motion abnormalities. Left ventricular diastolic parameters are consistent with Grade I diastolic dysfunction (impaired relaxation). Elevated left atrial pressure.  3. Right ventricular systolic function is normal. The right ventricular size is normal.  4. Left atrial size was moderately dilated.  5. The mitral valve is normal in structure. Trivial mitral valve regurgitation. No evidence of mitral stenosis.  6. The aortic valve is tricuspid. Aortic valve regurgitation is mild. Mild aortic valve stenosis.  7. The inferior vena cava is normal in size with greater than 50% respiratory variability, suggesting right atrial pressure of 3 mmHg. FINDINGS  Left Ventricle: Left ventricular ejection fraction, by estimation, is 50 to 55%. The left ventricle has low normal function. The left ventricle has no regional wall motion abnormalities. The left ventricular internal cavity size was normal in size. There is no left ventricular hypertrophy. Left ventricular diastolic parameters are consistent with Grade I  diastolic dysfunction (impaired relaxation). Elevated left atrial pressure. Right Ventricle: The right ventricular size is normal. Right ventricular systolic function is normal. Left Atrium: Left atrial size was moderately dilated. Right Atrium: Right atrial size was normal in size. Pericardium: Trivial pericardial effusion is present. Mitral Valve: The mitral valve is normal in structure. Normal mobility of the mitral valve leaflets. Mild mitral annular calcification. Trivial mitral valve regurgitation. No evidence of mitral valve stenosis. Tricuspid Valve: The tricuspid valve is normal in structure. Tricuspid valve regurgitation is trivial. No evidence of tricuspid stenosis. Aortic Valve: The aortic valve is tricuspid. Aortic valve regurgitation is mild. Aortic regurgitation PHT measures 349 msec. Mild aortic stenosis is present. Aortic valve mean gradient measures 11.3 mmHg. Aortic valve peak gradient measures 15.7 mmHg. Aortic valve area, by VTI measures 1.50 cm. Pulmonic Valve: The pulmonic valve was normal in structure. Pulmonic valve regurgitation is not visualized. No evidence of pulmonic stenosis. Aorta: The aortic root is normal in size and structure. Venous: The inferior vena cava is normal in size with greater than 50% respiratory variability, suggesting right atrial pressure of 3 mmHg.  Additional Comments: Normal LV systolic function; grade 1 diastolic dysfunction; very mild AS (mean gradient 11 mmHg); mild AI; moderate LAE.  LEFT VENTRICLE PLAX 2D LVIDd:         4.60 cm  Diastology LVIDs:         3.20 cm  LV e' lateral:   6.53 cm/s LV PW:         1.10 cm  LV E/e' lateral: 13.2 LV IVS:        1.10 cm  LV e' medial:    5.00 cm/s LVOT diam:     1.70 cm  LV E/e' medial:  17.2 LV SV:         51 LV SV Index:   28 LVOT Area:     2.27 cm  RIGHT VENTRICLE RV S prime:     22.00 cm/s TAPSE (M-mode): 2.9 cm LEFT ATRIUM             Index LA diam:        4.20 cm 2.30 cm/m LA Vol (A2C):   78.5 ml 43.00 ml/m LA  Vol (A4C):  82.4 ml 45.14 ml/m LA Biplane Vol: 81.0 ml 44.37 ml/m  AORTIC VALVE AV Area (Vmax):    1.50 cm AV Area (Vmean):   1.30 cm AV Area (VTI):     1.50 cm AV Vmax:           198.00 cm/s AV Vmean:          159.667 cm/s AV VTI:            0.343 m AV Peak Grad:      15.7 mmHg AV Mean Grad:      11.3 mmHg LVOT Vmax:         131.00 cm/s LVOT Vmean:        91.700 cm/s LVOT VTI:          0.226 m LVOT/AV VTI ratio: 0.66 AI PHT:            349 msec  AORTA Ao Root diam: 3.20 cm MITRAL VALVE MV Area (PHT): 7.02 cm     SHUNTS MV Decel Time: 108 msec     Systemic VTI:  0.23 m MV E velocity: 85.90 cm/s   Systemic Diam: 1.70 cm MV A velocity: 139.00 cm/s MV E/A ratio:  0.62 Kirk Ruths MD Electronically signed by Kirk Ruths MD Signature Date/Time: 01/30/2020/3:37:01 PM    Final      Time Spent in minutes  30     Desiree Hane M.D on 02/03/2020 at 12:38 PM  To page go to [email or web address] - password [facility]

## 2020-02-03 NOTE — Progress Notes (Signed)
Patient ID: Kathleen Duran, female   DOB: [birth year], 69 y.o.   MRN: [id number]         [facility] for Infectious Disease  Date of Admission:  01/23/2020           Day 12 meropenem ASSESSMENT: She continues to improve on therapy for severe pneumococcal meningitis.  PLAN: 1. Continue meropenem for 2 more days 2. Physical therapy  Principal Problem:   Pneumococcal meningitis Active Problems:   Sepsis ([place])   Bacteremia due to Streptococcus pneumoniae   Essential hypertension   AKI (acute kidney injury) ([place])   Dehydration with hypernatremia   Chronic pansinusitis   Interstitial lung disease ([facility])   GERD (gastroesophageal reflux disease)   History of chronic lymphocytic leukemia   Status post splenectomy   Unintentional weight loss   Scheduled Meds: . chlorhexidine  15 mL Mouth Rinse BID  . Chlorhexidine Gluconate Cloth  6 each Topical Daily  . diclofenac Sodium  2 g Topical QID  . enoxaparin (LOVENOX) injection  40 mg Subcutaneous Q24H  . feeding supplement (ENSURE ENLIVE)  237 mL Oral TID BM  . levETIRAcetam  750 mg Oral BID  . mouth rinse  15 mL Mouth Rinse q12n4p  . metoprolol tartrate  25 mg Oral BID  . multivitamin with minerals  1 tablet Oral Daily  . pantoprazole  40 mg Oral Daily  . sodium chloride flush  10-40 mL Intracatheter Q12H   Continuous Infusions: . sodium chloride    . sodium chloride 500 mL (02/01/20 0811)  . meropenem (MERREM) IV 2 g (02/03/20 0349)   PRN Meds:.sodium chloride, sodium chloride, acetaminophen, haloperidol lactate, labetalol, levalbuterol, LORazepam, ondansetron **OR** ondansetron (ZOFRAN) IV, sodium chloride flush   SUBJECTIVE: She is feeling better.  She notes improvement in her memory and concentration.  She is still having left shoulder pain.  She says that she wonders if she fell and hurt it when she got so sick at home.  She says that her husband cannot recall if she fell.  Review of Systems: Review of Systems    Constitutional: Positive for malaise/fatigue and weight loss. Negative for fever.  HENT: Negative for hearing loss.   Eyes: Negative for blurred vision.  Respiratory: Negative for cough.   Cardiovascular: Negative for chest pain.  Gastrointestinal: Negative for abdominal pain, diarrhea, nausea and vomiting.  Musculoskeletal: Positive for joint pain and neck pain. Negative for back pain.  Neurological: Negative for headaches.    Allergies  Allergen Reactions  . Cephalosporins Other (See Comments)    Patient has had hives and rash to cephalexin. Last time patient has received a cephalosporin type antibiotic was prior to the year 2000.  Marland Kitchen Contrast Media [Iodinated Diagnostic Agents] Hives and Shortness Of Breath  . Penicillins Hives    Patient has had hives and rash to amoxicillin and penicillin. Last time patient has received a penicillin type antibiotic was prior to the year 2000.  Marland Kitchen Codeine Nausea And Vomiting  . Iodine Rash    OBJECTIVE: Vitals:   02/02/20 2359 02/03/20 0400 02/03/20 0805 02/03/20 1159  BP: (!) 128/49 (!) 138/59 (!) 152/89 (!) 146/72  Pulse: 92 96 (!) 108 (!) 104  Resp: (!) 23 20 16 20   Temp: 99 F (37.2 C) 98.1 F (36.7 C) 98.8 F (37.1 C)   TempSrc: Oral Oral Oral   SpO2: 99% 99% 100% 97%  Weight:      Height:       Body mass index is  33.42 kg/m.  Physical Exam Constitutional:      Comments: She is more alert.  She is sitting up in a chair.  Cardiovascular:     Rate and Rhythm: Normal rate and regular rhythm.     Heart sounds: No murmur.  Pulmonary:     Effort: Pulmonary effort is normal.     Breath sounds: Normal breath sounds.  Musculoskeletal:        General: No swelling or tenderness.     Cervical back: Neck supple.  Skin:    Findings: No rash.  Neurological:     General: No focal deficit present.     Lab Results Lab Results  Component Value Date   WBC 20.2 (H) 02/03/2020   HGB 10.1 (L) 02/03/2020   HCT 30.1 (L) 02/03/2020    MCV 82.2 02/03/2020   PLT 474 (H) 02/03/2020    Lab Results  Component Value Date   CREATININE 0.65 02/02/2020   BUN 16 02/02/2020   NA 137 02/02/2020   K 4.2 02/02/2020   CL 106 02/02/2020   CO2 22 02/02/2020    Lab Results  Component Value Date   ALT 42 01/28/2020   AST 19 01/28/2020   ALKPHOS 96 01/28/2020   BILITOT 1.0 01/28/2020     Microbiology: Recent Results (from the past 240 hour(s))  CSF culture     Status: None   Collection Time: 01/26/20  5:37 PM   Specimen: PATH Cytology CSF; Cerebrospinal Fluid  Result Value Ref Range Status   Specimen Description CSF  Final   Special Requests NONE  Final   Gram Stain   Final    WBC PRESENT, PREDOMINANTLY PMN NO ORGANISMS SEEN CYTOSPIN SMEAR    Culture   Final    NO GROWTH 3 DAYS Performed at [facility], [address], [place], [place] [address]    Report Status 01/30/2020 FINAL  Final  Culture, fungus without smear     Status: None (Preliminary result)   Collection Time: 01/26/20  5:37 PM   Specimen: CSF  Result Value Ref Range Status   Specimen Description CSF  Final   Special Requests NONE  Final   Culture   Final    NO FUNGUS ISOLATED AFTER 7 DAYS Performed at [facility], [place] [address] [place], [place] [address]    Report Status PENDING  Incomplete  Culture, blood (Routine X 2) w Reflex to ID Panel     Status: None   Collection Time: 01/29/20  3:11 PM   Specimen: BLOOD  Result Value Ref Range Status   Specimen Description BLOOD RIGHT ANTECUBITAL  Final   Special Requests   Final    BOTTLES DRAWN AEROBIC AND ANAEROBIC Blood Culture adequate volume   Culture   Final    NO GROWTH 5 DAYS Performed at [facility], [place] [address] [place], [place] [address]    Report Status 02/03/2020 FINAL  Final  Culture, blood (Routine X 2) w Reflex to ID Panel     Status: None   Collection Time: 01/29/20  3:19 PM   Specimen: BLOOD RIGHT HAND  Result Value Ref Range Status   Specimen  Description BLOOD RIGHT HAND  Final   Special Requests IN PEDIATRIC BOTTLE Blood Culture adequate volume  Final   Culture   Final    NO GROWTH 5 DAYS Performed at [facility], [place] [address], [place], [place] [address]    Report Status 02/03/2020  FINAL  Final    Michel Bickers, MD [facility] [phone] pager   [phone] cell 02/03/2020, 12:34 PM

## 2020-02-03 NOTE — Progress Notes (Signed)
  Speech Language Pathology Treatment: Dysphagia;Cognitive-Linquistic  Patient Details Name: Kathleen Duran MRN: [id number] DOB: 04-10-1951 Today's Date: 02/03/2020 Time: [id number] SLP Time Calculation (min) (ACUTE ONLY): 26 min  Assessment / Plan / Recommendation Clinical Impression  Pt was seen for cognitive and dysphagia therapy. SLP provided PO trials under skilled observation with no overt signs of dysphagia or aspiration noted. Her swallowing function appears to have made improvements as her mentation has been clearing. Recommend continuing regular solids and thin liquids; SLP to sign off for dysphagia.   Cognitively pt still needs Mod cues for selective attention and working memory. She voiced her concern over decreased ability to do simple mental math, as she has always been proud of her "numbers" and her ability to analyze information. Pt exhibited moderate trouble with verbal problem solving, but performed better when given Min visual cues. Recommend CIR for further cognitive therapy given pt's level of independence PTA. She would benefit from intensive therapy to maximize safety and functional independence. Education was provided to pt about activities and strategies to use while in the hospital to better engage her cognitive skills.    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 Started on empiric antibiotics for possible meningitis and admitted for further work-up.      SLP Plan  Goals updated       Recommendations  Diet recommendations: Regular;Thin liquid Liquids provided via: Cup;Straw Medication Administration: Whole meds with liquid Supervision: Patient able to self feed;Intermittent supervision to cue for  compensatory strategies Compensations: Slow rate;Small sips/bites;Follow solids with liquid Postural Changes and/or Swallow Maneuvers: Seated upright 90 degrees                Oral Care Recommendations: Oral care BID Follow up Recommendations: Inpatient Rehab SLP Visit Diagnosis: Dysphagia, unspecified (R13.10);Cognitive communication deficit [phone]) Plan: Goals updated       GO                 Osie Bond., M.A. [facility] Pager [phone] Office [phone]  02/03/2020, 3:02 PM

## 2020-02-03 NOTE — Progress Notes (Signed)
Physical Therapy Treatment Patient Details Name: Kathleen Duran MRN: [id number] DOB: [birth year] Today's Date: 02/03/2020    History of Present Illness Patient is a 69 y/o female who presents with AMS, fever, nausea, vomiting and progressive agitation and confusion. 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    PT Comments    Patient seen for mobility progression. Pt presents with generalized weakness, impaired balance, and cognitive deficits increasing risk for falls. Pt tolerated increased gait this session with min/mod A and RW. Pt independent without AD prior to admission and has 6 steps to enter home. Pt requires mod/max A for bed mobility given poor motor planning and limited use of L UE due to pain. Pt demonstrates decreased awareness of deficits. Given pt's current mobility level continue to recommend post acute rehab for further skilled PT services to maximize independence and safety with mobility.    Follow Up Recommendations  CIR;Supervision for mobility/OOB;Supervision/Assistance - 24 hour     Equipment Recommendations  Other (comment)(defer to next venue)    Recommendations for Other Services OT consult;Rehab consult     Precautions / Restrictions Precautions Precautions: Fall Restrictions Weight Bearing Restrictions: No    Mobility  Bed Mobility Overal bed mobility: Needs Assistance Bed Mobility: Rolling;Sidelying to Sit Rolling: Mod assist Sidelying to sit: Max assist       General bed mobility comments: HOB flat as pt reports she would like to d/c home; multimodal cues for sequencing; hand over hand assist to reach for rail going toward R side; minimal assist with L UE due to pain/limited ROM; assist to bring bilat LE from EOB, scoot hips, and elevate trunk into sitting  Transfers Overall transfer level: Needs assistance Equipment used: Rolling walker (2 wheeled) Transfers:  Sit to/from Stand Sit to Stand: Mod assist;Min assist         General transfer comment: assistance to power up into standing and to maintain balance in standing; cues for safe hand placement; pt assisted L UE to reach RW grip with use of R UE  Ambulation/Gait Ambulation/Gait assistance: Min assist;Mod assist Gait Distance (Feet): (75 ft X 2 trials with seated rest break) Assistive device: Rolling walker (2 wheeled) Gait Pattern/deviations: Step-through pattern;Decreased stride length;Trunk flexed;Drifts right/left(drifting to L side ) Gait velocity: decreased   General Gait Details: increased bilat LE weakness noted with increased gait distance and seated rest break required; pt appears to have impaired proprioception and tendency to drift toward L side with RW needing cues to manage and navigate hallway; assistance needed for balance    Stairs             Wheelchair Mobility    Modified Rankin (Stroke Patients Only)       Balance Overall balance assessment: Needs assistance Sitting-balance support: Bilateral upper extremity supported;Feet supported Sitting balance-Leahy Scale: Poor     Standing balance support: Bilateral upper extremity supported;During functional activity Standing balance-Leahy Scale: Poor                              Cognition Arousal/Alertness: Awake/alert Behavior During Therapy: WFL for tasks assessed/performed Overall Cognitive Status: No family/caregiver present to determine baseline cognitive functioning Area of Impairment: Memory;Safety/judgement;Problem solving                     Memory: Decreased short-term memory   Safety/Judgement: Decreased awareness of safety;Decreased awareness of deficits   Problem  Solving: Decreased initiation;Requires verbal cues;Difficulty sequencing General Comments: pt with poor insight into deficits      Exercises      General Comments        Pertinent Vitals/Pain Pain  Assessment: Faces Faces Pain Scale: Hurts little more Pain Location: back Pain Descriptors / Indicators: Sore Pain Intervention(s): Limited activity within patient's tolerance;Monitored during session;Repositioned;Heat applied    Home Living                      Prior Function            PT Goals (current goals can now be found in the care plan section) Acute Rehab PT Goals Patient Stated Goal: to get back to independence Progress towards PT goals: Progressing toward goals    Frequency    Min 3X/week      PT Plan Current plan remains appropriate    Co-evaluation              AM-PAC PT "6 Clicks" Mobility   Outcome Measure  Help needed turning from your back to your side while in a flat bed without using bedrails?: A Lot Help needed moving from lying on your back to sitting on the side of a flat bed without using bedrails?: A Lot Help needed moving to and from a bed to a chair (including a wheelchair)?: A Lot Help needed standing up from a chair using your arms (e.g., wheelchair or bedside chair)?: A Lot Help needed to walk in hospital room?: A Little Help needed climbing 3-5 steps with a railing? : Total 6 Click Score: 12    End of Session Equipment Utilized During Treatment: Gait belt Activity Tolerance: Patient tolerated treatment well Patient left: in chair;with call bell/phone within reach;with nursing/sitter in room(sitter in room) Nurse Communication: Mobility status PT Visit Diagnosis: Muscle weakness (generalized) (M62.81);Difficulty in walking, not elsewhere classified (R26.2);Unsteadiness on feet (R26.81)     Time: [id number] PT Time Calculation (min) (ACUTE ONLY): 37 min  Charges:  $Gait Training: 8-22 mins $Therapeutic Activity: 8-22 mins                     Earney Navy, PTA Acute Rehabilitation Services Pager: [phone] Office: [phone]     Darliss Cheney 02/03/2020, 4:43 PM

## 2020-02-03 NOTE — Progress Notes (Signed)
[facility]  PROGRESS NOTE  Kathleen Duran [id number] DOB: 03-24-1951 DOA: 01/23/2020 PCP: Arlyss Repress, MD Admit date - 01/23/2020   Admitting Physician Kerney Elbe, DO  Outpatient Primary MD for the patient is Arlyss Repress, MD  LOS - 11 Brief Narrative   Kathleen Duran is a 69 y.o. year old female with medical history significant forpromyelocytic leukemia under remission, hypertension, GERD  who presented on 01/23/2020 with fever, sudden confusion x 24 hours and vomiting and was found to have nuchal rigidity, leukocytosis of 70,000, and creatinine of 3.09 (normal in October) and admitted with working diagnosis of sepsis secondary to presumed meningitis.  Underwent unsuccessful LP in ED and started on empiric antibiotics for meningitis.  Hospital course complicated by altered mental status concern for meningitis based on MRI brain imaging.  Patient underwent LP on 3/29 which showed markedly elevated WBC of greater than 30,000 predominant neutrophilic, elevated protein of 292, and depressed glucose consistent with bacterial meningitis.HSV1/2 negative. DC'd vancomycin, ampicillin, and acyclovir on 3/30 given likely etiology is strep pna bacteremia presumed from pansinusitis. ID consulted an agreed  Subjective  Today still having left shoulder pain.   A & P   Altered mental status secondary to meningitis, likely strep pneumo given concomitant S.pna bacteremia, resolved Mental status back to baseline still having neck pain and headache but no focal deficits.  H.  Remains afebrile, based off of LP likely bacterial, given strep pneumo in blood this is likely etiology no growth on CSF fluid culture -Currently on meropenem, to continue for 14 days per ID recommendations, end date 4/8 -Patient no longer under IVC, is a status alert and oriented x4.  Sepsis secondary to Strep pneumo bacteremia and Strep pna meningitis/encephalitis and AKI, resolved All stemming from  meningitis related to strep pneumo/bacteremia in setting of splenectomy.  Question if sinusitis was nidus for meningitis based on CT head imaging. Afebrile for last 24 hours, repeat blood cultures seem to be showing clearance, TTE with no vegetations, no other obvious signs of infection. -Appreciate ID recs, continue meropenem for total of 14 days given severity of her infection and presence of pansinusitis -Daily CBC -Repeat blood cultures on 4/1 documented clearance  Sinus tachycardia. Has LBBB on admission, per EKG in [place] shows chronic left anterior fascicular block, improving.  Remains asymptomatic. Is net negative on admission, tolerating po intake but not much, does not look significantly dehydrated on exam, no longer septic -Off IV fluids encourage p.o. intake --increase Lopressor to 37.5 mg twice daily (takes 50 mg qd at home) -Monitor on telemetry  Left shoulder pain, reproducible on exam.  X-ray nonacute. -Diclofenac gel topical 4 times daily  Coccygeal pain.  Has pressure injury grade 1, with some unroofing of skin. -Continue Mepilex dressing -Continue to offload, turn as needed -Warm compresses to the apply to area --Tramadol PRN pain  Mild aortic stenosis. Confirmed on TTE no vegetations. Mean gradient 11 mm Hg -ID does not recommend TEE --outpatient follow up with [facility]  Diastolic grade 1 CHF. On TTE. With mild AS above noted. No O2 requirements, doesn't look volume overload. CXR on 3/30 with no congeston - monitor output - monitor weights  Elevated LFTs, acute liver injury in the setting of sepsis, resolved Peak ALT of 240, and AST 161.  Now back to normal baseline. --held home atorvastatin on admission  Discrete skin discoloration on left leg, that extends to upper abdomen, stable Husband reports this is a birthmark at bedside.  -  Closely monitor  LTM concerning for epileptogenicity and cortical dysfunction in left frontotemporal region No  definite seizures seen on LTM EEG recording -neurology recommends continuing Keppra 750 mg twice daily,   Hypernatremia, in the setting of diminished p.o. intake, resolved -Currently on D5 half-normal/expect improvement now that patient able to tolerate diet (dysphagia 2 diet) -Repeat BMP in a.m.  AKI, resolved. Multifactorial etiology: contrast/diminished p.o. intake/urinary retention/briefly on vancomycin. Peak Cr  3.91 on 4/1 now back to baseline. Responded well to IVF  -Avoid nephrotoxins (vancomycin has been DC'd) -foley in place -RUS showed mild bilateral pelviectasis without hydronephrosis,  monitor output  -Daily BMP -Holding home irbesartan  Mild Bilateral Pelviectasis without hydronephrosis. Found on RUS during workup for AKI as patient was found to have acute urinary retention requiring foley.  -voiding trial today  HTN, at goal -lopressor increased to 37.5 mg BID ( home regimen 50 mg qd ) -Hold home irbesartan given AKI on admission and currently normotensive  GERD, stable -Continue home PPI  History of B-cell prolymphocytic leukemia, status post splenectomy and chemotherapy Evaluated by oncology this admission, per their review peripheral smear seems more reactive, doubt relapse, likely all related to meningitis above.   --Given splenectomy at high risk for encapsulated organism infections  Interstitial lung disease, chronic interstitial pneumonitis.  On room air, no respiratory symptomsNo history of structural lung disease.  Undergoing work-up for hypersensitivity pneumonitis as outpatient. No longer on prednisone - Followed by [facility] --incentive spirometry, flutter valve, Out of bed to chair with assistance    Family Communication  : none  Code Status : Full  Disposition Plan  :  Patient is from home. Anticipated d/c date:  3-4 days. Barriers to d/c or necessity for inpatient status:  Continue IV meropenem for bacterial meningitis to  continue for total of 14 days, end date 4/8 per ID. PT recommends CIR, patient will discuss today with CIR Consults  : Neurology, oncology, ID  Procedures  : 3/29, LP under fluoroscopy  DVT Prophylaxis  :  Lovenox  Lab Results  Component Value Date   PLT 474 (H) 02/03/2020    Diet :  Diet Order            Diet regular Room service appropriate? Yes with Assist; Fluid consistency: Thin  Diet effective now               Inpatient Medications Scheduled Meds: . chlorhexidine  15 mL Mouth Rinse BID  . Chlorhexidine Gluconate Cloth  6 each Topical Daily  . diclofenac Sodium  2 g Topical QID  . enoxaparin (LOVENOX) injection  40 mg Subcutaneous Q24H  . feeding supplement (ENSURE ENLIVE)  237 mL Oral TID BM  . levETIRAcetam  750 mg Oral BID  . mouth rinse  15 mL Mouth Rinse q12n4p  . metoprolol tartrate  37.5 mg Oral BID  . multivitamin with minerals  1 tablet Oral Daily  . pantoprazole  40 mg Oral Daily  . sodium chloride flush  10-40 mL Intracatheter Q12H   Continuous Infusions: . sodium chloride    . sodium chloride 500 mL (02/01/20 0811)  . meropenem (MERREM) IV 2 g (02/03/20 0349)   PRN Meds:.sodium chloride, sodium chloride, acetaminophen, haloperidol lactate, labetalol, levalbuterol, LORazepam, ondansetron **OR** ondansetron (ZOFRAN) IV, sodium chloride flush, traMADol  Antibiotics  :   Anti-infectives (From admission, onward)   Start     Dose/Rate Route Frequency Ordered Stop   01/31/20 1000  meropenem (MERREM) 2 g  in sodium chloride 0.9 % 100 mL IVPB     2 g 200 mL/hr over 30 Minutes Intravenous Every 8 hours 01/31/20 0939 02/05/20 2359   01/30/20 1400  meropenem (MERREM) 2 g in sodium chloride 0.9 % 100 mL IVPB  Status:  Discontinued     2 g 200 mL/hr over 30 Minutes Intravenous Every 12 hours 01/30/20 1003 01/31/20 0939   01/29/20 1400  meropenem (MERREM) 1 g in sodium chloride 0.9 % 100 mL IVPB  Status:  Discontinued     1 g 200 mL/hr over 30 Minutes  Intravenous Every 12 hours 01/29/20 1020 01/30/20 1003   01/28/20 1400  meropenem (MERREM) 2 g in sodium chloride 0.9 % 100 mL IVPB  Status:  Discontinued     2 g 200 mL/hr over 30 Minutes Intravenous Every 12 hours 01/28/20 1039 01/29/20 1020   01/25/20 1200  acyclovir (ZOVIRAX) 610 mg in dextrose 5 % 100 mL IVPB  Status:  Discontinued     610 mg 112.2 mL/hr over 60 Minutes Intravenous Every 8 hours 01/25/20 0924 01/25/20 0933   01/25/20 1000  meropenem (MERREM) 2 g in sodium chloride 0.9 % 100 mL IVPB  Status:  Discontinued     2 g 200 mL/hr over 30 Minutes Intravenous Every 8 hours 01/25/20 0919 01/28/20 1039   01/25/20 1000  vancomycin (VANCOREADY) IVPB 750 mg/150 mL  Status:  Discontinued     750 mg 150 mL/hr over 60 Minutes Intravenous Every 12 hours 01/25/20 0919 01/27/20 1547   01/24/20 0230  acyclovir (ZOVIRAX) 680 mg in dextrose 5 % 100 mL IVPB  Status:  Discontinued     680 mg 113.6 mL/hr over 60 Minutes Intravenous Every 24 hours 01/24/20 0137 01/25/20 0924   01/23/20 2200  meropenem (MERREM) 1 g in sodium chloride 0.9 % 100 mL IVPB  Status:  Discontinued     1 g 200 mL/hr over 30 Minutes Intravenous Every 8 hours 01/23/20 2042 01/23/20 2044   01/23/20 2200  meropenem (MERREM) 2 g in sodium chloride 0.9 % 100 mL IVPB  Status:  Discontinued     2 g 200 mL/hr over 30 Minutes Intravenous Every 8 hours 01/23/20 2044 01/23/20 2052   01/23/20 2100  meropenem (MERREM) 1 g in sodium chloride 0.9 % 100 mL IVPB  Status:  Discontinued     1 g 200 mL/hr over 30 Minutes Intravenous Every 12 hours 01/23/20 2052 01/25/20 0919   01/23/20 2055  vancomycin variable dose per unstable renal function (pharmacist dosing)  Status:  Discontinued      Does not apply See admin instructions 01/23/20 2056 01/25/20 0934   01/23/20 2045  vancomycin (VANCOREADY) IVPB 1750 mg/350 mL     1,750 mg 175 mL/hr over 120 Minutes Intravenous  Once 01/23/20 2042 01/24/20 0010       Objective   Vitals:    02/02/20 2359 02/03/20 0400 02/03/20 0805 02/03/20 1159  BP: (!) 128/49 (!) 138/59 (!) 152/89 (!) 146/72  Pulse: 92 96 (!) 108 (!) 104  Resp: (!) 23 20 16 20   Temp: 99 F (37.2 C) 98.1 F (36.7 C) 98.8 F (37.1 C)   TempSrc: Oral Oral Oral   SpO2: 99% 99% 100% 97%  Weight:      Height:        SpO2: 97 % O2 Flow Rate (L/min): 2 L/min  Wt Readings from Last 3 Encounters:  02/02/20 84.6 kg  04/22/18 92.6 kg  04/17/16 95.4 kg  Intake/Output Summary (Last 24 hours) at 02/03/2020 1308 Last data filed at 02/03/2020 0403 Gross per 24 hour  Intake 480 ml  Output 3300 ml  Net -2820 ml    Physical Exam:  Awake Alert, Oriented X to self, place, time context Follows all commands.Moves all extremities against gravity Decreased strength in left arm ( but improved from prior exam) [place].AT, Normal respiratory effort on , CTAB Tachycardic, SEM heard, no edema,  +ve B.Sounds, Abd Soft, No tenderness, No rebound, guarding or rigidity. Skin intact except mild unroofing of skin on coccyx area with mepilex dressing in place that is clean, dry, and intact Left Leg         I have personally reviewed the following:   Data Reviewed:  CBC Recent Labs  Lab 01/28/20 0421 01/29/20 0759 01/30/20 0554 01/31/20 0509 02/01/20 0515 02/02/20 0438 02/03/20 0332  WBC 24.4*   < > 25.0* 28.1* 24.8* 24.5* 20.2*  HGB 13.4   < > 11.3* 10.9* 10.4* 10.5* 10.1*  HCT 40.4   < > 34.1* 32.8* 31.6* 31.5* 30.1*  PLT 187   < > 212 280 340 434* 474*  MCV 81.1   < > 81.0 81.6 81.4 81.2 82.2  MCH 26.9   < > 26.8 27.1 26.8 27.1 27.6  MCHC 33.2   < > 33.1 33.2 32.9 33.3 33.6  RDW 17.1*   < > 16.5* 16.2* 16.2* 16.0* 15.9*  LYMPHSABS 1.5  --   --   --   --   --   --   MONOABS 6.7*  --   --   --   --   --   --   EOSABS 0.2  --   --   --   --   --   --   BASOSABS 0.1  --   --   --   --   --   --    < > = values in this interval not displayed.    Chemistries  Recent Labs  Lab 01/28/20 0421  01/28/20 0421 01/29/20 0759 01/30/20 0554 01/31/20 0509 02/01/20 0515 02/01/20 2220 02/02/20 0438  NA 152*   < > 146* 144 141 139  --  137  K 3.6   < > 4.1 4.1 3.7 3.6  --  4.2  CL 118*   < > 114* 115* 110 108  --  106  CO2 20*   < > 17* 18* 21* 21*  --  22  GLUCOSE 136*   < > 124* 98 117* 92  --  109*  BUN 48*   < > 77* 37* 22 16  --  16  CREATININE 1.95*   < > 3.91* 1.12* 0.81 0.80  --  0.65  CALCIUM 8.7*   < > 8.5* 8.3* 8.1* 8.1*  --  8.2*  MG 2.3  --   --   --   --   --  1.7 1.7  AST 19  --   --   --   --   --   --   --   ALT 42  --   --   --   --   --   --   --   ALKPHOS 96  --   --   --   --   --   --   --   BILITOT 1.0  --   --   --   --   --   --   --    < > =  values in this interval not displayed.   ------------------------------------------------------------------------------------------------------------------ No results for input(s): CHOL, HDL, LDLCALC, TRIG, CHOLHDL, LDLDIRECT in the last 72 hours.  No results found for: HGBA1C ------------------------------------------------------------------------------------------------------------------ No results for input(s): TSH, T4TOTAL, T3FREE, THYROIDAB in the last 72 hours.  Invalid input(s): FREET3 ------------------------------------------------------------------------------------------------------------------ No results for input(s): VITAMINB12, FOLATE, FERRITIN, TIBC, IRON, RETICCTPCT in the last 72 hours.  Coagulation profile No results for input(s): INR, PROTIME in the last 168 hours.  No results for input(s): DDIMER in the last 72 hours.  Cardiac Enzymes No results for input(s): CKMB, TROPONINI, MYOGLOBIN in the last 168 hours.  Invalid input(s): CK ------------------------------------------------------------------------------------------------------------------ No results found for: BNP  Micro Results Recent Results (from the past 240 hour(s))  CSF culture     Status: None   Collection Time: 01/26/20   5:37 PM   Specimen: PATH Cytology CSF; Cerebrospinal Fluid  Result Value Ref Range Status   Specimen Description CSF  Final   Special Requests NONE  Final   Gram Stain   Final    WBC PRESENT, PREDOMINANTLY PMN NO ORGANISMS SEEN CYTOSPIN SMEAR    Culture   Final    NO GROWTH 3 DAYS Performed at [facility], [address] [place], [place] [address]    Report Status 01/30/2020 FINAL  Final  Culture, fungus without smear     Status: None (Preliminary result)   Collection Time: 01/26/20  5:37 PM   Specimen: CSF  Result Value Ref Range Status   Specimen Description CSF  Final   Special Requests NONE  Final   Culture   Final    NO FUNGUS ISOLATED AFTER 7 DAYS Performed at [facility], [place] [address] [place], [place] [address]    Report Status PENDING  Incomplete  Culture, blood (Routine X 2) w Reflex to ID Panel     Status: None   Collection Time: 01/29/20  3:11 PM   Specimen: BLOOD  Result Value Ref Range Status   Specimen Description BLOOD RIGHT ANTECUBITAL  Final   Special Requests   Final    BOTTLES DRAWN AEROBIC AND ANAEROBIC Blood Culture adequate volume   Culture   Final    NO GROWTH 5 DAYS Performed at [facility], [place] [address], [place], [place] [address]    Report Status 02/03/2020 FINAL  Final  Culture, blood (Routine X 2) w Reflex to ID Panel     Status: None   Collection Time: 01/29/20  3:19 PM   Specimen: BLOOD RIGHT HAND  Result Value Ref Range Status   Specimen Description BLOOD RIGHT HAND  Final   Special Requests IN PEDIATRIC BOTTLE Blood Culture adequate volume  Final   Culture   Final    NO GROWTH 5 DAYS Performed at [facility], [place] [address], [place],  [address]    Report Status 02/03/2020 FINAL  Final    Radiology Reports CT Abdomen Pelvis Wo Contrast  Result Date: 01/23/2020 CLINICAL DATA:  Patient appears jaundiced history of lymphoma EXAM: CT ABDOMEN AND PELVIS WITHOUT CONTRAST TECHNIQUE: Multidetector CT  imaging of the abdomen and pelvis was performed following the standard protocol without IV contrast. COMPARISON:  None. FINDINGS: Lower chest: The visualized heart size within normal limits. No pericardial fluid/thickening. No hiatal hernia. The visualized portions of the lungs are clear. Hepatobiliary: Although limited due to the lack of intravenous contrast, normal in appearance without gross focal abnormality. The patient is status post cholecystectomy. No biliary ductal  dilation. Pancreas:  Unremarkable.  No surrounding inflammatory changes. 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 Adrenals/Urinary Tract: Both adrenal glands appear normal. The kidneys and collecting system appear normal without evidence of urinary tract calculus or hydronephrosis. Bladder is unremarkable. Stomach/Bowel: The stomach, small bowel, and colon are normal in appearance. No inflammatory changes or obstructive findings. Scattered colonic diverticula are noted without diverticulitis. Appendix is normal. Vascular/Lymphatic: There are no enlarged abdominal or pelvic lymph nodes. Scattered aortic atherosclerotic calcifications are seen without aneurysmal dilatation. Reproductive: The uterus and adnexa are unremarkable. Other: No evidence of abdominal wall mass or hernia. Musculoskeletal: No acute or significant osseous findings. IMPRESSION: No acute intra-abdominal or pelvic pathology to explain the patient's symptoms. Diverticulosis without diverticulitis. Aortic Atherosclerosis (ICD10-I70.0). Electronically Signed   By: Prudencio Pair M.D.   On: 01/23/2020 22:03   CT Head Wo Contrast  Result Date: 01/23/2020 CLINICAL DATA:  Altered mental status. Left facial droop. EXAM: CT HEAD WITHOUT CONTRAST TECHNIQUE: Contiguous axial images were obtained from the base of the skull through the vertex without intravenous contrast. COMPARISON:  Brain MRI 11/24/2019 FINDINGS: Brain: No acute  hemorrhage. No extra-axial or subdural collection. Mild generalized atrophy with moderate periventricular and deep white matter hypodensity consistent with chronic small vessel ischemia. This appears slightly asymmetric in the right cerebral hemisphere but no definite cortical infarct. Small calcification in left basal ganglia, likely senescent. No midline shift, hydrocephalus or suspicious mass effect. Vascular: Atherosclerosis of skullbase vasculature without hyperdense vessel or abnormal calcification. Skull: No fracture or focal lesion. Sinuses/Orbits: Chronic paranasal sinus disease with near complete opacification of frontal sinuses, ethmoid air cells, left sphenoid and maxillary sinus. Mucosal thickening in the right sphenoid and maxillary sinus. The degree sinus opacification has slightly progressed from prior MRI. Mastoid air cells are clear. Other: None. IMPRESSION: 1. No hemorrhage or evidence of acute infarct. 2. Mild atrophy with moderate chronic small vessel ischemia. 3. Chronic paranasal sinus disease, slightly progressed from January MRI. Electronically Signed   By: Keith Rake M.D.   On: 01/23/2020 22:02   MR BRAIN WO CONTRAST  Result Date: 01/24/2020 CLINICAL DATA:  Initial evaluation for acute encephalopathy, altered mental status. History of leukemia, in remission. EXAM: MRI HEAD WITHOUT CONTRAST TECHNIQUE: Multiplanar, multiecho pulse sequences of the brain and surrounding structures were obtained without intravenous contrast. COMPARISON:  Prior CT from 01/23/2020 as well as previous MRI from 11/24/2019. FINDINGS: Brain: Examination somewhat technically limited by susceptibility artifact emanating from the scalp. Generalized age-related cerebral atrophy. Patchy T2/FLAIR hyperintensity within the periventricular and deep white matter both cerebral hemispheres again seen, nonspecific, but most like related chronic microvascular ischemic disease, stable from previous. No abnormal foci of  restricted diffusion to suggest acute or subacute ischemia. Gray-white matter differentiation maintained. No encephalomalacia to suggest chronic cortical infarction. Diffuse FLAIR hyperintensity seen throughout the cerebral sulci, suspected to be artifactual nature due to overlying susceptibility artifact. Possible proteinaceous material not entirely excluded. No associated susceptibility artifact to suggest subarachnoid hemorrhage. No mass lesion, midline shift, or mass effect. Ventricles normal size without hydrocephalus. No appreciable intraventricular debris. No extra-axial fluid collection. No made of an empty sella. Midline structures intact. Vascular: Major intracranial vascular flow voids are maintained. Skull and upper cervical spine: Craniocervical junction within normal limits. Postsurgical changes partially visualize within the upper cervical spine. Bone marrow signal intensity heterogeneous and diffusely decreased on T1 weighted imaging, most like related history of leukemia. Susceptibility artifact involving the left  greater than right scalp, of uncertain etiology. Sinuses/Orbits: Globes and orbital soft tissues demonstrate no acute finding. Extensive opacity and mucosal thickening seen throughout the paranasal sinuses, with superimposed right maxillary sinus air-fluid level. Findings could be infectious and/or inflammatory nature. Trace bilateral mastoid effusions. Visualized inner ear structures grossly normal. Other: None. IMPRESSION: 1. Technically limited exam due to susceptibility artifact emanating from the scalp, of uncertain etiology. 2. Diffuse FLAIR hyperintensity throughout the cerebral sulci and surrounding the brainstem and cerebellum. While this finding could be artifactual in nature related to overlying susceptibility artifact, proteinaceous material and changes related to meningitis could also have this appearance. Correlation with LP and CSF studies recommended for correlated purposes  as clinically warranted. No susceptibility artifact to suggest subarachnoid hemorrhage. 3. 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 4. Extensive pan sinusitis. This could be either infectious or inflammatory in nature. Electronically Signed   By: Jeannine Boga M.D.   On: 01/24/2020 06:46   US RENAL  Result Date: 01/29/2020 CLINICAL DATA:  Acute kidney injury EXAM: RENAL / URINARY TRACT ULTRASOUND COMPLETE COMPARISON:  None. FINDINGS: Right Kidney: Renal measurements: 11.1 x 6.2 x 6.6 cm = volume: Is 236 mL. There is mild pelviectasis without frank hydronephrosis. Left Kidney: Renal measurements: 12 x 6.5 x 6.3 cm = volume: 255 mL. There is mild pelviectasis without frank hydronephrosis. Bladder: Only the left ureteral jet was visualized. Other: None. IMPRESSION: 1. Mild bilateral pelviectasis without evidence for frank hydronephrosis. 2. Only the left ureteral jet was visualized on today's exam. 3. No shadowing echogenic stones identified on this study. Electronically Signed   By: Constance Holster M.D.   On: 01/29/2020 16:46   IR Fluoro Guide Ndl Plmt / BX  Result Date: 01/27/2020 CLINICAL DATA:  Confusion, altered mental status EXAM: DIAGNOSTIC LUMBAR PUNCTURE UNDER FLUOROSCOPIC GUIDANCE FLUOROSCOPY TIME:  0.1 minute; 12  uGym2 DAP PROCEDURE: Informed consent was obtained from the family prior to the procedure, including potential complications of headache, allergy, and pain. With the patient prone, the lower back was prepped with Betadine. Intravenous Fentanyl 23mcg and Versed 1mg  were administered as conscious sedation during continuous monitoring of the patient's level of consciousness and physiological / cardiorespiratory status by the radiology RN, with a total moderate sedation time of 35 minutes. 1% Lidocaine was used for local anesthesia. Lumbar puncture was performed at the L3-4 level from a right  parasagittal approach using a 20 gauge needle with return of yellow cloudy CSF with an opening pressure of 13 cm water. 9 ml of CSF were obtained for laboratory studies. The patient tolerated the procedure well and there were no apparent complications. IMPRESSION: 1. Technically successful lumbar puncture under fluoroscopy Electronically Signed   By: Lucrezia Europe M.D.   On: 01/27/2020 07:59   DG CHEST PORT 1 VIEW  Result Date: 01/27/2020 CLINICAL DATA:  Shortness of breath. History of leukemia. EXAM: PORTABLE CHEST 1 VIEW COMPARISON:  01/27/2020 FINDINGS: Midline trachea. Borderline cardiomegaly. Atherosclerosis in the transverse aorta. No pleural effusion or pneumothorax. Surgical clips in the left upper quadrant. No congestive failure. No lobar consolidation. IMPRESSION: Borderline cardiomegaly, without acute disease. Aortic Atherosclerosis (ICD10-I70.0). Electronically Signed   By: Abigail Miyamoto M.D.   On: 01/27/2020 19:16   DG CHEST PORT 1 VIEW  Result Date: 01/27/2020 CLINICAL DATA:  SOB (shortness of breath) EXAM: PORTABLE CHEST - 1 VIEW COMPARISON:  the previous day's study FINDINGS: Relatively low lung volumes with resultant crowding  of bronchovascular structures. The mild interstitial edema seen previously continues to improve. No new airspace disease. Heart size within normal limits for technique. Aortic Atherosclerosis (ICD10-170.0). No effusion. No pneumothorax. Cervical fixation hardware partially visualized. Surgical clips in the upper abdomen. IMPRESSION: Improving interstitial edema. Electronically Signed   By: Lucrezia Europe M.D.   On: 01/27/2020 11:55   DG CHEST PORT 1 VIEW  Result Date: 01/26/2020 CLINICAL DATA:  Leukocytosis EXAM: PORTABLE CHEST 1 VIEW COMPARISON:  01/24/2020 FINDINGS: The cardiac silhouette, mediastinal and hilar contours are stable. Low lung volumes with vascular crowding and streaky atelectasis. Slight improved lung aeration since the prior study with less vascular  congestion and resolving edema. No pleural effusions. IMPRESSION: 1. Low lung volumes with vascular crowding and streaky atelectasis. 2. Improved lung aeration. Electronically Signed   By: Marijo Sanes M.D.   On: 01/26/2020 07:34   DG CHEST PORT 1 VIEW  Result Date: 01/24/2020 CLINICAL DATA:  Shortness of breath and hypoxia. EXAM: PORTABLE CHEST 1 VIEW COMPARISON:  Radiograph yesterday. FINDINGS: Lower lung volumes from prior exam. Prominent heart size likely accentuated by technique. Bronchovascular crowding versus vascular congestion. No significant pleural effusion. No pneumothorax. No confluent airspace disease. No acute osseous abnormalities are seen. IMPRESSION: Lower lung volumes from prior exam. Bronchovascular crowding versus vascular congestion. Prominent heart size likely accentuated by technique and low lung volumes. Electronically Signed   By: Keith Rake M.D.   On: 01/24/2020 19:19   DG Chest Port 1 View  Result Date: 01/23/2020 CLINICAL DATA:  69 year old female with sepsis. EXAM: PORTABLE CHEST 1 VIEW COMPARISON:  Chest radiographs 12/19/2018 and earlier. FINDINGS: Portable AP semi upright view at 2045 hours. Larger lung volumes. Cardiac size at the upper limits of normal. Calcified aortic atherosclerosis. Other mediastinal contours are within normal limits. Visualized tracheal air column is within normal limits. Allowing for portable technique the lungs are clear. Chronic left upper quadrant surgical clips and cervical ACDF. Negative visible bowel gas pattern. No acute osseous abnormality identified. IMPRESSION: No acute cardiopulmonary abnormality. Aortic Atherosclerosis (ICD10-I70.0). Electronically Signed   By: Genevie Ann M.D.   On: 01/23/2020 20:55   DG Shoulder Left  Result Date: 02/02/2020 CLINICAL DATA:  Left shoulder pain. EXAM: LEFT SHOULDER - 2+ VIEW COMPARISON:  December 19, 2018. FINDINGS: There is no evidence of fracture or dislocation. There is no evidence of arthropathy  or other focal bone abnormality. Soft tissues are unremarkable. IMPRESSION: Negative. Electronically Signed   By: Marijo Conception M.D.   On: 02/02/2020 16:31   EEG adult  Result Date: 01/24/2020 Lora Havens, MD     01/24/2020 10:59 AM Patient Name: KHARMEN BUSCHMANN MRN: [id number] Epilepsy Attending: Lora Havens Referring Physician/Provider: Dr Amie [place] Date: 01/24/2020 Duration: 24.09 mins Patient history: 69 y.o.femalewith past medical history significant for leukemia, non-Hodgkin's lymphoma 2002 presented with altered mental status, fever, significantly elevated leukocytosis, with a rightward gaze preference. EEG to evaluate for status epilepticus. Level of alertness: lethargic AEDs during EEG study: Ativan, Keppra Technical aspects: This EEG study was done with scalp electrodes positioned according to the 10-20 International system of electrode placement. Electrical activity was acquired at a sampling rate of 500Hz  and reviewed with a high frequency filter of 70Hz  and a low frequency filter of 1Hz . EEG data were recorded continuously and digitally stored. DESCRIPTION: EEG showed continuous 3-4Hz  delta slowing in right hemisphere. There is also continuous 5-8Hz  theta-alpha activity in left hemisphere with intermittent 2-3hz  delta activity..  Hyperventilation and  photic stimulation were not performed. ABNORMALITY - Continuous slow, generalized and lateralized right hemisphere IMPRESSION: This study is suggestive of cortical dysfunction in right hemisphere likely secondary to post-ictal state, underlying structural abnormality.  No seizures or definite epileptiform discharges were seen throughout the recording. Dr Rory Percy was notified. Priyanka Barbra Sarks   Overnight EEG with video  Result Date: 01/24/2020 Lora Havens, MD     01/25/2020 10:07 AM Patient Name: ASTYN BRITTEN MRN: [id number] Epilepsy Attending: Lora Havens Referring Physician/Provider: Dr Amie Portland Duration:  01/24/2020 [id number] to 01/25/2020 [id number]  Patient history: 69 y.o.femalewith past medical history significant for leukemia, non-Hodgkin's lymphoma 2002 presented with altered mental status, fever, significantly elevated leukocytosis, with a rightward gaze preference. EEG to evaluate for status epilepticus.  Level of alertness: lethargic  AEDs during EEG study: Ativan, Keppra  Technical aspects: This EEG study was done with scalp electrodes positioned according to the 10-20 International system of electrode placement. Electrical activity was acquired at a sampling rate of 500Hz  and reviewed with a high frequency filter of 70Hz  and a low frequency filter of 1Hz . EEG data were recorded continuously and digitally stored.  DESCRIPTION: EEG showed continuous 5-8Hz  theta-alpha activity in left hemisphere with intermittent 2-3hz  delta activity which at times appears quasi rhythmic. Sharp waves were also noted in left frontal region. Continuous 3-4Hz  low amplitude delta slowing was also noted in right hemisphere. Hyperventilation and photic stimulation were not performed.  ABNORMALITY - Sharp waves, left frontotemporal region - Intermittent rhythmic delta activity, left frontal region - Continuous slow, generalized and lateralized right hemisphere  IMPRESSION: This study is suggestive of epileptogenicity and cortical dysfunction in left frontotemporal region. Quasi rhythmic delta activity with sharp waves in left frontotemporal region can be on the ictal-interictal continuum. Additionally, there is evidence of cortical dysfunction in right hemisphere likely secondary to post-ictal state, underlying structural abnormality.  No definite seizures were seen throughout the recording. Lora Havens   ECHOCARDIOGRAM COMPLETE  Result Date: 01/30/2020    ECHOCARDIOGRAM REPORT   Patient Name:   SUSANE HARGREAVES [place] Date of Exam: 01/30/2020 Medical Rec #:  [id number]            Height:       62.6 in Accession #:    [id number]            Weight:       176.4 lb Date of Birth:  1951/07/19             BSA:          1.826 m Patient Age:    26 years             BP:           127/49 mmHg Patient Gender: F                    HR:           99 bpm. Exam Location:  Inpatient Procedure: 2D Echo Indications:    Bacteremia 790.7 / R78.81  History:        Patient has no prior history of Echocardiogram examinations.                 Risk Factors:Hypertension. Acute kidney                 Sinus tachycardia                 Sepsis  Meningitis.  Sonographer:    Vikki Ports Turrentine Referring Phys: [id number] [place]  1. Normal LV systolic function; grade 1 diastolic dysfunction; very mild AS (mean gradient 11 mmHg); mild AI; moderate LAE.  2. Left ventricular ejection fraction, by estimation, is 50 to 55%. The left ventricle has low normal function. The left ventricle has no regional wall motion abnormalities. Left ventricular diastolic parameters are consistent with Grade I diastolic dysfunction (impaired relaxation). Elevated left atrial pressure.  3. Right ventricular systolic function is normal. The right ventricular size is normal.  4. Left atrial size was moderately dilated.  5. The mitral valve is normal in structure. Trivial mitral valve regurgitation. No evidence of mitral stenosis.  6. The aortic valve is tricuspid. Aortic valve regurgitation is mild. Mild aortic valve stenosis.  7. The inferior vena cava is normal in size with greater than 50% respiratory variability, suggesting right atrial pressure of 3 mmHg. FINDINGS  Left Ventricle: Left ventricular ejection fraction, by estimation, is 50 to 55%. The left ventricle has low normal function. The left ventricle has no regional wall motion abnormalities. The left ventricular internal cavity size was normal in size. There is no left ventricular hypertrophy. Left ventricular diastolic parameters are consistent with Grade I diastolic dysfunction (impaired relaxation). Elevated  left atrial pressure. Right Ventricle: The right ventricular size is normal. Right ventricular systolic function is normal. Left Atrium: Left atrial size was moderately dilated. Right Atrium: Right atrial size was normal in size. Pericardium: Trivial pericardial effusion is present. Mitral Valve: The mitral valve is normal in structure. Normal mobility of the mitral valve leaflets. Mild mitral annular calcification. Trivial mitral valve regurgitation. No evidence of mitral valve stenosis. Tricuspid Valve: The tricuspid valve is normal in structure. Tricuspid valve regurgitation is trivial. No evidence of tricuspid stenosis. Aortic Valve: The aortic valve is tricuspid. Aortic valve regurgitation is mild. Aortic regurgitation PHT measures 349 msec. Mild aortic stenosis is present. Aortic valve mean gradient measures 11.3 mmHg. Aortic valve peak gradient measures 15.7 mmHg. Aortic valve area, by VTI measures 1.50 cm. Pulmonic Valve: The pulmonic valve was normal in structure. Pulmonic valve regurgitation is not visualized. No evidence of pulmonic stenosis. Aorta: The aortic root is normal in size and structure. Venous: The inferior vena cava is normal in size with greater than 50% respiratory variability, suggesting right atrial pressure of 3 mmHg.  Additional Comments: Normal LV systolic function; grade 1 diastolic dysfunction; very mild AS (mean gradient 11 mmHg); mild AI; moderate LAE.  LEFT VENTRICLE PLAX 2D LVIDd:         4.60 cm  Diastology LVIDs:         3.20 cm  LV e' lateral:   6.53 cm/s LV PW:         1.10 cm  LV E/e' lateral: 13.2 LV IVS:        1.10 cm  LV e' medial:    5.00 cm/s LVOT diam:     1.70 cm  LV E/e' medial:  17.2 LV SV:         51 LV SV Index:   28 LVOT Area:     2.27 cm  RIGHT VENTRICLE RV S prime:     22.00 cm/s TAPSE (M-mode): 2.9 cm LEFT ATRIUM             Index LA diam:        4.20 cm 2.30 cm/m LA Vol (A2C):   78.5 ml 43.00 ml/m LA Vol (A4C):  82.4 ml 45.14 ml/m LA Biplane Vol: 81.0  ml 44.37 ml/m  AORTIC VALVE AV Area (Vmax):    1.50 cm AV Area (Vmean):   1.30 cm AV Area (VTI):     1.50 cm AV Vmax:           198.00 cm/s AV Vmean:          159.667 cm/s AV VTI:            0.343 m AV Peak Grad:      15.7 mmHg AV Mean Grad:      11.3 mmHg LVOT Vmax:         131.00 cm/s LVOT Vmean:        91.700 cm/s LVOT VTI:          0.226 m LVOT/AV VTI ratio: 0.66 AI PHT:            349 msec  AORTA Ao Root diam: 3.20 cm MITRAL VALVE MV Area (PHT): 7.02 cm     SHUNTS MV Decel Time: 108 msec     Systemic VTI:  0.23 m MV E velocity: 85.90 cm/s   Systemic Diam: 1.70 cm MV A velocity: 139.00 cm/s MV E/A ratio:  0.62 Kirk Ruths MD Electronically signed by Kirk Ruths MD Signature Date/Time: 01/30/2020/3:37:01 PM    Final      Time Spent in minutes  30     Desiree Hane M.D on 02/03/2020 at 1:08 PM  To page go to [email or web address] - password [facility]

## 2020-02-03 NOTE — Progress Notes (Signed)
Inpatient Rehab Admissions Coordinator:   Met with pt at the bedside to explain goals/expectations of a CIR stay.  At this time, pt states that she is not interested in an inpatient program, and prefers to go home.  She did agree for me to speak with her husband, whom I will attempt to f/u with today.   Shann Medal, PT, DPT Admissions Coordinator [phone] 02/03/20  12:46 PM

## 2020-02-03 NOTE — Plan of Care (Signed)
  Problem: Education: Goal: Knowledge of General Education information will improve Description: Including pain rating scale, medication(s)/side effects and non-pharmacologic comfort measures Outcome: Progressing  Patient able to recall transfer information with repetition and reorientation from staff.  Problem: Clinical Measurements: Goal: Will remain free from infection Outcome: Progressing  Patient afebrile this shift.  Problem: Activity: Goal: Risk for activity intolerance will decrease Outcome: Progressing  Pt up in room and in hall with assistance, denies pain or SOB.

## 2020-02-04 LAB — GLUCOSE, CAPILLARY
Glucose-Capillary: 96 mg/dL (ref 70–99)
Glucose-Capillary: 96 mg/dL (ref 70–99)

## 2020-02-04 MED ORDER — ZOLPIDEM TARTRATE 5 MG PO TABS
5.0000 mg | ORAL_TABLET | Freq: Every evening | ORAL | Status: AC | PRN
  Administered 2020-02-04: 5 mg via ORAL
  Filled 2020-02-04: qty 1

## 2020-02-04 NOTE — Progress Notes (Signed)
Physical Therapy Treatment Patient Details Name: Kathleen Duran MRN: [id number] DOB: 04-Feb-1951 Today's Date: 02/04/2020    History of Present Illness Patient is a 69 y/o female who presents with AMS, fever, nausea, vomiting and progressive agitation and confusion. 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    PT Comments    Patient seen for PT treatment. Pt continues to present with impaired cognition, balance, and gait. Pt requires max-max A +2 for bed mobility given poor motor planning, min-mod A for functional transfer training, and mod A for gait training. Pt drifts to L side while ambulating and running into objects down hallway requiring cues and assist to navigate environment. While ambulating pt with L lateral LOB requiring therapist to prevent fall. Given pt's current mobility level and cognitive deficits continue to recommend post acute rehab upon d/c to decrease risk of falls and caregiver burden.   Follow Up Recommendations  CIR;Supervision for mobility/OOB;Supervision/Assistance - 24 hour (pt continues to decline post acute rehab and will need 24 hour physical assistance/supervision upon d/c.)     Equipment Recommendations  Other (comment)(defer to next venue)    Recommendations for Other Services OT consult;Rehab consult     Precautions / Restrictions Precautions Precautions: Fall Restrictions Weight Bearing Restrictions: No    Mobility  Bed Mobility Overal bed mobility: Needs Assistance Bed Mobility: Rolling;Sidelying to Sit Rolling: Max assist Sidelying to sit: Max assist;+2 for safety/equipment       General bed mobility comments: multimodal cues for sequencing; max A for all bed mobility given poor motor planning   Transfers Overall transfer level: Needs assistance Equipment used: Rolling walker (2 wheeled) Transfers: Sit to/from Stand Sit to Stand: Mod assist;Min  assist         General transfer comment: assistance to power up into standing and to maintain balance in standing; cues for safe hand placement; pt assisted L UE to reach RW grip with use of R UE  Ambulation/Gait Ambulation/Gait assistance: Min assist;Mod assist;+2 safety/equipment(chair follow) Gait Distance (Feet): (70 ft X 2 trials with seated rest break required) Assistive device: Rolling walker (2 wheeled) Gait Pattern/deviations: Step-through pattern;Decreased stride length;Trunk flexed;Drifts right/left(drifting to L side ) Gait velocity: decreased   General Gait Details: increased bilat LE weakness noted with increased gait distance and seated rest break required; pt has impaired proprioception and drifts to L side; pt running into objects all the way down hallway on L side and requires cues and assist to manage navigating environment safely; Pt with impaired balance and with L lateral LOB requiring total A to recover with therapist fully preventing fall   Stairs             Wheelchair Mobility    Modified Rankin (Stroke Patients Only)       Balance Overall balance assessment: Needs assistance Sitting-balance support: Bilateral upper extremity supported;Feet supported Sitting balance-Leahy Scale: Poor     Standing balance support: Bilateral upper extremity supported;During functional activity Standing balance-Leahy Scale: Poor                              Cognition Arousal/Alertness: Awake/alert Behavior During Therapy: WFL for tasks assessed/performed Overall Cognitive Status: No family/caregiver present to determine baseline cognitive functioning Area of Impairment: Memory;Safety/judgement;Problem solving;Attention;Following commands                   Current Attention Level: Sustained Memory: Decreased short-term memory;Decreased  recall of precautions Following Commands: Follows one step commands with increased time;Follows one step  commands inconsistently Safety/Judgement: Decreased awareness of safety;Decreased awareness of deficits   Problem Solving: Decreased initiation;Requires verbal cues;Difficulty sequencing;Slow processing;Requires tactile cues General Comments: pt with poor insight into deficits      Exercises      General Comments        Pertinent Vitals/Pain Pain Assessment: Faces Faces Pain Scale: Hurts little more Pain Location: back Pain Descriptors / Indicators: Sore Pain Intervention(s): Monitored during session;Repositioned    Home Living                      Prior Function            PT Goals (current goals can now be found in the care plan section) Progress towards PT goals: Progressing toward goals    Frequency    Min 3X/week      PT Plan Current plan remains appropriate    Co-evaluation PT/OT/SLP Co-Evaluation/Treatment: Yes Reason for Co-Treatment: Complexity of the patient's impairments (multi-system involvement);Necessary to address cognition/behavior during functional activity;For patient/therapist safety;To address functional/ADL transfers PT goals addressed during session: Mobility/safety with mobility        AM-PAC PT "6 Clicks" Mobility   Outcome Measure  Help needed turning from your back to your side while in a flat bed without using bedrails?: Total Help needed moving from lying on your back to sitting on the side of a flat bed without using bedrails?: A Lot Help needed moving to and from a bed to a chair (including a wheelchair)?: A Lot Help needed standing up from a chair using your arms (e.g., wheelchair or bedside chair)?: A Lot Help needed to walk in hospital room?: A Lot Help needed climbing 3-5 steps with a railing? : A Lot 6 Click Score: 11    End of Session Equipment Utilized During Treatment: Gait belt Activity Tolerance: Patient tolerated treatment well Patient left: in chair;with call bell/phone within reach;with nursing/sitter in  room(sitter in room) Nurse Communication: Mobility status PT Visit Diagnosis: Muscle weakness (generalized) (M62.81);Difficulty in walking, not elsewhere classified (R26.2);Unsteadiness on feet (R26.81)     Time: [id number] PT Time Calculation (min) (ACUTE ONLY): 44 min  Charges:  $Gait Training: 8-22 mins                     Earney Navy, PTA Acute Rehabilitation Services Pager: [phone] Office: [phone]     Darliss Cheney 02/04/2020, 4:51 PM

## 2020-02-04 NOTE — Progress Notes (Addendum)
CSW aware that IVC is expiring today. CSW asked MD about patient's mental status now, and MD not wanting to renew IVC at this time; agreeable to allowing it to expire.  Patient no longer under IVC as her mental status has improved; it has expired.  Laveda Abbe, [place] Clinical Social Worker [phone]

## 2020-02-04 NOTE — Progress Notes (Signed)
Patient ID: Kathleen Duran, female   DOB: [birth year], 69 y.o.   MRN: [id number]         [facility]  Date of Admission:  01/23/2020           Day 13 meropenem ASSESSMENT: She continues to improve on therapy for severe bacteremic pneumococcal meningitis and pansinusitis.  PLAN: 1. Continue meropenem for 1 more days 2. I have arranged follow-up in my clinic on 02/23/2020 3. 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 chlorhexidine  15 mL Mouth Rinse BID  . Chlorhexidine Gluconate Cloth  6 each Topical Daily  . diclofenac Sodium  2 g Topical QID  . enoxaparin (LOVENOX) injection  40 mg Subcutaneous Q24H  . feeding supplement (ENSURE ENLIVE)  237 mL Oral TID BM  . levETIRAcetam  750 mg Oral BID  . mouth rinse  15 mL Mouth Rinse q12n4p  . metoprolol tartrate  37.5 mg Oral BID  . multivitamin with minerals  1 tablet Oral Daily  . pantoprazole  40 mg Oral Daily  . sodium chloride flush  10-40 mL Intracatheter Q12H   Continuous Infusions: . sodium chloride    . sodium chloride 250 mL (02/03/20 2134)  . meropenem (MERREM) IV 2 g (02/04/20 1135)   PRN Meds:.sodium chloride, sodium chloride, acetaminophen, haloperidol lactate, labetalol, levalbuterol, LORazepam, ondansetron **OR** ondansetron (ZOFRAN) IV, sodium chloride flush, traMADol   SUBJECTIVE: She is  feeling better each day.  She is very eager to go home.  Review of Systems: Review of Systems  Constitutional: Positive for malaise/fatigue and weight loss. Negative for fever.  HENT: Negative for hearing loss.   Eyes: Negative for blurred vision.  Respiratory: Negative for cough.   Cardiovascular: Negative for chest pain.  Gastrointestinal: Negative for abdominal pain, diarrhea, nausea and vomiting.  Musculoskeletal: Positive for joint pain and neck pain. Negative for back pain.  Neurological: Negative for headaches.    Allergies  Allergen Reactions  . Cephalosporins Other (See Comments)    Patient has had hives and rash to cephalexin. Last time patient has received a cephalosporin type antibiotic was prior to the year 2000.  Marland Kitchen Contrast Media [Iodinated Diagnostic Agents] Hives and Shortness Of Breath  . Penicillins Hives    Patient has had hives and rash to amoxicillin and penicillin. Last time patient has received a penicillin type antibiotic was prior to the year 2000.  Marland Kitchen Codeine Nausea And Vomiting  . Iodine Rash    OBJECTIVE: Vitals:   02/04/20 0017 02/04/20 0406 02/04/20 0806 02/04/20 1200  BP: 128/63 (!) 143/67 140/63 115/76  Pulse: 76 92 96 80  Resp: 19 19 20    Temp:   98 F (36.7 C) 97.7 F (36.5 C)  TempSrc:   Oral Oral  SpO2: 97% 100% 100% 100%  Weight:      Height:  Body mass index is 33.42 kg/m.  Physical Exam Constitutional:      Comments: Her husband is visiting.  She is sitting up in a chair.  Cardiovascular:     Rate and Rhythm: Normal rate and regular rhythm.     Heart sounds: No murmur.  Pulmonary:     Effort: Pulmonary effort is normal.     Breath sounds: Normal breath sounds.  Musculoskeletal:        General: No swelling or tenderness.     Cervical back: Neck supple.  Skin:    Findings: No rash.  Neurological:     General: No focal deficit present.     Lab Results Lab Results  Component Value Date   WBC 20.2 (H) 02/03/2020    HGB 10.1 (L) 02/03/2020   HCT 30.1 (L) 02/03/2020   MCV 82.2 02/03/2020   PLT 474 (H) 02/03/2020    Lab Results  Component Value Date   CREATININE 0.65 02/02/2020   BUN 16 02/02/2020   NA 137 02/02/2020   K 4.2 02/02/2020   CL 106 02/02/2020   CO2 22 02/02/2020    Lab Results  Component Value Date   ALT 42 01/28/2020   AST 19 01/28/2020   ALKPHOS 96 01/28/2020   BILITOT 1.0 01/28/2020     Microbiology: Recent Results (from the past 240 hour(s))  CSF culture     Status: None   Collection Time: 01/26/20  5:37 PM   Specimen: PATH Cytology CSF; Cerebrospinal Fluid  Result Value Ref Range Status   Specimen Description CSF  Final   Special Requests NONE  Final   Gram Stain   Final    WBC PRESENT, PREDOMINANTLY PMN NO ORGANISMS SEEN CYTOSPIN SMEAR    Culture   Final    NO GROWTH 3 DAYS Performed at [facility], [address], [place], [place] [address]    Report Status 01/30/2020 FINAL  Final  Culture, fungus without smear     Status: None (Preliminary result)   Collection Time: 01/26/20  5:37 PM   Specimen: CSF  Result Value Ref Range Status   Specimen Description CSF  Final   Special Requests NONE  Final   Culture   Final    NO FUNGUS ISOLATED AFTER 8 DAYS Performed at [facility], [place] [address], [place], [place] [address]    Report Status PENDING  Incomplete  Culture, blood (Routine X 2) w Reflex to ID Panel     Status: None   Collection Time: 01/29/20  3:11 PM   Specimen: BLOOD  Result Value Ref Range Status   Specimen Description BLOOD RIGHT ANTECUBITAL  Final   Special Requests   Final    BOTTLES DRAWN AEROBIC AND ANAEROBIC Blood Culture adequate volume   Culture   Final    NO GROWTH 5 DAYS Performed at [facility], [place] [address] [place], [place] [address]    Report Status 02/03/2020 FINAL  Final  Culture, blood (Routine X 2) w Reflex to ID Panel     Status: None   Collection Time: 01/29/20  3:19 PM   Specimen: BLOOD RIGHT HAND   Result Value Ref Range Status   Specimen Description BLOOD RIGHT HAND  Final   Special Requests IN PEDIATRIC BOTTLE Blood Culture adequate volume  Final   Culture   Final    NO GROWTH 5 DAYS Performed at [facility], [place] [address], [place], [place] [address]  Report Status 02/03/2020 FINAL  Final    Michel Bickers, MD [facility] [place] [facility] [phone] pager   [phone] cell 02/04/2020, 12:34 PM

## 2020-02-04 NOTE — Progress Notes (Signed)
Inpatient Rehab Admissions Coordinator:   Met with pt and her husband at the bedside.  Pt still prefers to d/c home and husband in agreement.  Can provide 24/7 at discharge.  CIR will sign off at this time.   Shann Medal, PT, DPT Admissions Coordinator [phone] 02/04/20  3:12 PM

## 2020-02-04 NOTE — Progress Notes (Signed)
[facility]  PROGRESS NOTE  Kathleen Duran [id number] DOB: 1951/04/18 DOA: 01/23/2020 PCP: Arlyss Repress, MD Admit date - 01/23/2020   Admitting Physician Kerney Elbe, DO  Outpatient Primary MD for the patient is Arlyss Repress, MD  LOS - 12 Brief Narrative   Kathleen Duran is a 69 y.o. year old female with medical history significant forpromyelocytic leukemia under remission, hypertension, GERD  who presented on 01/23/2020 with fever, sudden confusion x 24 hours and vomiting and was found to have nuchal rigidity, leukocytosis of 70,000, and creatinine of 3.09 (normal in October) and admitted with working diagnosis of sepsis secondary to presumed meningitis.  Underwent unsuccessful LP in ED and started on empiric antibiotics for meningitis.  Hospital course complicated by altered mental status concern for meningitis based on MRI brain imaging.  Patient underwent LP on 3/29 which showed markedly elevated WBC of greater than 30,000 predominant neutrophilic, elevated protein of 292, and depressed glucose consistent with bacterial meningitis.HSV1/2 negative. DC'd vancomycin, ampicillin, and acyclovir on 3/30 given likely etiology is strep pna bacteremia presumed from pansinusitis. ID consulted an agreed  Subjective  Patient interviewed and examined along with spouse at bedside.  Overall patient feels much better.  Able to explain why she is hospitalized.  Denies any complaints.  As per spouse, patient has done significantly better, mental status changes have resolved.  Both have decided that they do not want her to go to CIR but want to return home on 4/9.  As per RN, no acute issues noted.  A & P   Acute encephalopathy secondary to meningitis, likely strep pneumo given concomitant S.pna bacteremia, resolved Acute encephalopathy resolved.  Remains afebrile, based off of LP likely bacterial, given strep pneumo in blood this is likely etiology no growth on CSF fluid  culture -Currently on meropenem, to continue for 14 days per ID recommendations, end date 4/8 -Patient no longer under IVC (expired 4/6 and not renewed), is a status alert and oriented x4.  Discontinued safety sitter 4/7  Sepsis secondary to Strep pneumo bacteremia and Strep pna meningitis/encephalitis and AKI, resolved All stemming from meningitis related to strep pneumo/bacteremia in setting of splenectomy.  Question if sinusitis was nidus for meningitis based on CT head imaging. Afebrile, repeat blood cultures seem to be showing clearance, TTE with no vegetations, no other obvious signs of infection. -Appreciate ID recs, continue meropenem for total of 14 days given severity of her infection and presence of pansinusitis -Daily CBC -Repeat blood cultures on 4/1 documented clearance  Sinus tachycardia. Has LBBB on admission, per EKG in [place] shows chronic left anterior fascicular block, improving.  Remains asymptomatic.  Clinically euvolemic.  Tolerating diet. --increase Lopressor to 37.5 mg twice daily (takes 50 mg qd at home) -Monitor on telemetry: Shows SR with BBB morphology.  Left shoulder pain, reproducible on exam early on in admission.  X-ray nonacute. -Diclofenac gel topical 4 times daily.  Did not complain today.  Coccygeal pain.  Has pressure injury grade 1, with some unroofing of skin. -Continue Mepilex dressing -Continue to offload, turn as needed -Warm compresses to the apply to area --Tramadol PRN pain  Mild aortic stenosis. Confirmed on TTE no vegetations. Mean gradient 11 mm Hg -ID does not recommend TEE --outpatient follow up with [facility]  Diastolic grade 1 CHF. On TTE. With mild AS above noted. No O2 requirements, doesn't look volume overload. CXR on 3/30 with no congeston - monitor output - monitor weights  Elevated LFTs, acute liver  injury in the setting of sepsis, resolved Peak ALT of 240, and AST 161.  Now back to normal baseline. --held home  atorvastatin on admission  Discrete skin discoloration on left leg, that extends to upper abdomen, stable Husband reports this is a birthmark at bedside.  -Closely monitor  LTM concerning for epileptogenicity and cortical dysfunction in left frontotemporal region No definite seizures seen on LTM EEG recording -neurology recommends continuing Keppra 750 mg twice daily, consider outpatient neurology follow-up to determine further management including duration of AEDs.  Hypernatremia, in the setting of diminished p.o. intake, resolved -Currently on D5 half-normal/expect improvement now that patient able to tolerate diet (dysphagia 2 diet) -Hypernatremia resolved  AKI, resolved. Multifactorial etiology: contrast/diminished p.o. intake/urinary retention/briefly on vancomycin. Peak Cr  3.91 on 4/1 now back to baseline. Responded well to IVF  -Avoid nephrotoxins (vancomycin has been DC'd) -foley in place-seems to have been discontinued. -RUS showed mild bilateral pelviectasis without hydronephrosis,  monitor output  -Daily BMP -Holding home irbesartan  Mild Bilateral Pelviectasis without hydronephrosis. Found on RUS during workup for AKI as patient was found to have acute urinary retention requiring foley.  -Foley catheter seems to have been removed.  HTN, at goal -lopressor increased to 37.5 mg BID ( home regimen 50 mg qd ) -Hold home irbesartan given AKI on admission and currently normotensive  GERD, stable -Continue home PPI  History of B-cell prolymphocytic leukemia, status post splenectomy and chemotherapy Evaluated by oncology this admission, per their review peripheral smear seems more reactive, doubt relapse, likely all related to meningitis above.   --Given splenectomy at high risk for encapsulated organism infections  Interstitial lung disease, chronic interstitial pneumonitis.  On room air, no respiratory symptomsNo history of structural lung disease.  Undergoing work-up for  hypersensitivity pneumonitis as outpatient. No longer on prednisone - Followed by [facility] --incentive spirometry, flutter valve, Out of bed to chair with assistance    Family Communication  : Husband updated at bedside on 4/7  Code Status : Full  Disposition Plan  :  Patient is from home. Anticipated d/c date:  4/9. Barriers to d/c or necessity for inpatient status:  Continue IV meropenem for bacterial meningitis to continue for total of 14 days, end date 4/8 per ID, likely to be in the night and hence will not be ready for discharge until 4/9. PT recommends CIR, patient and spouse declined Consults  : Neurology, oncology, ID  Procedures  : 3/29, LP under fluoroscopy  DVT Prophylaxis  :  Lovenox  Lab Results  Component Value Date   PLT 474 (H) 02/03/2020    Diet :  Diet Order            Diet regular Room service appropriate? Yes with Assist; Fluid consistency: Thin  Diet effective now               Inpatient Medications Scheduled Meds: . chlorhexidine  15 mL Mouth Rinse BID  . Chlorhexidine Gluconate Cloth  6 each Topical Daily  . diclofenac Sodium  2 g Topical QID  . enoxaparin (LOVENOX) injection  40 mg Subcutaneous Q24H  . feeding supplement (ENSURE ENLIVE)  237 mL Oral TID BM  . levETIRAcetam  750 mg Oral BID  . mouth rinse  15 mL Mouth Rinse q12n4p  . metoprolol tartrate  37.5 mg Oral BID  . multivitamin with minerals  1 tablet Oral Daily  . pantoprazole  40 mg Oral Daily  . sodium chloride flush  10-40  mL Intracatheter Q12H   Continuous Infusions: . sodium chloride    . sodium chloride 250 mL (02/03/20 2134)  . meropenem (MERREM) IV 2 g (02/04/20 1725)   PRN Meds:.sodium chloride, sodium chloride, acetaminophen, haloperidol lactate, labetalol, levalbuterol, LORazepam, ondansetron **OR** ondansetron (ZOFRAN) IV, sodium chloride flush, traMADol  Antibiotics  :   Anti-infectives (From admission, onward)   Start     Dose/Rate Route  Frequency Ordered Stop   01/31/20 1000  meropenem (MERREM) 2 g in sodium chloride 0.9 % 100 mL IVPB     2 g 200 mL/hr over 30 Minutes Intravenous Every 8 hours 01/31/20 0939 02/05/20 1200   01/30/20 1400  meropenem (MERREM) 2 g in sodium chloride 0.9 % 100 mL IVPB  Status:  Discontinued     2 g 200 mL/hr over 30 Minutes Intravenous Every 12 hours 01/30/20 1003 01/31/20 0939   01/29/20 1400  meropenem (MERREM) 1 g in sodium chloride 0.9 % 100 mL IVPB  Status:  Discontinued     1 g 200 mL/hr over 30 Minutes Intravenous Every 12 hours 01/29/20 1020 01/30/20 1003   01/28/20 1400  meropenem (MERREM) 2 g in sodium chloride 0.9 % 100 mL IVPB  Status:  Discontinued     2 g 200 mL/hr over 30 Minutes Intravenous Every 12 hours 01/28/20 1039 01/29/20 1020   01/25/20 1200  acyclovir (ZOVIRAX) 610 mg in dextrose 5 % 100 mL IVPB  Status:  Discontinued     610 mg 112.2 mL/hr over 60 Minutes Intravenous Every 8 hours 01/25/20 0924 01/25/20 0933   01/25/20 1000  meropenem (MERREM) 2 g in sodium chloride 0.9 % 100 mL IVPB  Status:  Discontinued     2 g 200 mL/hr over 30 Minutes Intravenous Every 8 hours 01/25/20 0919 01/28/20 1039   01/25/20 1000  vancomycin (VANCOREADY) IVPB 750 mg/150 mL  Status:  Discontinued     750 mg 150 mL/hr over 60 Minutes Intravenous Every 12 hours 01/25/20 0919 01/27/20 1547   01/24/20 0230  acyclovir (ZOVIRAX) 680 mg in dextrose 5 % 100 mL IVPB  Status:  Discontinued     680 mg 113.6 mL/hr over 60 Minutes Intravenous Every 24 hours 01/24/20 0137 01/25/20 0924   01/23/20 2200  meropenem (MERREM) 1 g in sodium chloride 0.9 % 100 mL IVPB  Status:  Discontinued     1 g 200 mL/hr over 30 Minutes Intravenous Every 8 hours 01/23/20 2042 01/23/20 2044   01/23/20 2200  meropenem (MERREM) 2 g in sodium chloride 0.9 % 100 mL IVPB  Status:  Discontinued     2 g 200 mL/hr over 30 Minutes Intravenous Every 8 hours 01/23/20 2044 01/23/20 2052   01/23/20 2100  meropenem (MERREM) 1 g in  sodium chloride 0.9 % 100 mL IVPB  Status:  Discontinued     1 g 200 mL/hr over 30 Minutes Intravenous Every 12 hours 01/23/20 2052 01/25/20 0919   01/23/20 2055  vancomycin variable dose per unstable renal function (pharmacist dosing)  Status:  Discontinued      Does not apply See admin instructions 01/23/20 2056 01/25/20 0934   01/23/20 2045  vancomycin (VANCOREADY) IVPB 1750 mg/350 mL     1,750 mg 175 mL/hr over 120 Minutes Intravenous  Once 01/23/20 2042 01/24/20 0010       Objective   Vitals:   02/04/20 0406 02/04/20 0806 02/04/20 1200 02/04/20 1500  BP: (!) 143/67 140/63 115/76 128/63  Pulse: 92 96 80 98  Resp:  19 20  20   Temp:  98 F (36.7 C) 97.7 F (36.5 C) 98.7 F (37.1 C)  TempSrc:  Oral Oral Oral  SpO2: 100% 100% 100% 98%  Weight:      Height:        SpO2: 98 % O2 Flow Rate (L/min): 2 L/min  Wt Readings from Last 3 Encounters:  02/02/20 84.6 kg  04/22/18 92.6 kg  04/17/16 95.4 kg     Intake/Output Summary (Last 24 hours) at 02/04/2020 2000 Last data filed at 02/04/2020 1216 Gross per 24 hour  Intake 820 ml  Output 500 ml  Net 320 ml    Physical Exam: Pleasant middle-aged female, moderately built and nourished sitting up comfortably in reclining chair this morning. Awake Alert, Oriented X person, place and partly to time. No focal neurological deficits. [place].AT, Normal respiratory effort on , CTAB Tachycardic, SEM heard, no edema, telemetry personally reviewed: Sinus rhythm with BBB morphology. +ve B.Sounds, Abd Soft, No tenderness, No rebound, guarding or rigidity. Skin intact except mild unroofing of skin on coccyx area with mepilex dressing in place that is clean, dry, and intact  Following your pictures taken early on in the admission. Left Leg         I have personally reviewed the following:   Data Reviewed:  CBC Recent Labs  Lab 01/30/20 0554 01/31/20 0509 02/01/20 0515 02/02/20 0438 02/03/20 0332  WBC 25.0* 28.1* 24.8* 24.5*  20.2*  HGB 11.3* 10.9* 10.4* 10.5* 10.1*  HCT 34.1* 32.8* 31.6* 31.5* 30.1*  PLT 212 280 340 434* 474*  MCV 81.0 81.6 81.4 81.2 82.2  MCH 26.8 27.1 26.8 27.1 27.6  MCHC 33.1 33.2 32.9 33.3 33.6  RDW 16.5* 16.2* 16.2* 16.0* 15.9*    Chemistries  Recent Labs  Lab 01/29/20 0759 01/30/20 0554 01/31/20 0509 02/01/20 0515 02/01/20 2220 02/02/20 0438  NA 146* 144 141 139  --  137  K 4.1 4.1 3.7 3.6  --  4.2  CL 114* 115* 110 108  --  106  CO2 17* 18* 21* 21*  --  22  GLUCOSE 124* 98 117* 92  --  109*  BUN 77* 37* 22 16  --  16  CREATININE 3.91* 1.12* 0.81 0.80  --  0.65  CALCIUM 8.5* 8.3* 8.1* 8.1*  --  8.2*  MG  --   --   --   --  1.7 1.7    Micro Results Recent Results (from the past 240 hour(s))  CSF culture     Status: None   Collection Time: 01/26/20  5:37 PM   Specimen: PATH Cytology CSF; Cerebrospinal Fluid  Result Value Ref Range Status   Specimen Description CSF  Final   Special Requests NONE  Final   Gram Stain   Final    WBC PRESENT, PREDOMINANTLY PMN NO ORGANISMS SEEN CYTOSPIN SMEAR    Culture   Final    NO GROWTH 3 DAYS Performed at [facility], [address] [place], [place] [address]    Report Status 01/30/2020 FINAL  Final  Culture, fungus without smear     Status: None (Preliminary result)   Collection Time: 01/26/20  5:37 PM   Specimen: CSF  Result Value Ref Range Status   Specimen Description CSF  Final   Special Requests NONE  Final   Culture   Final    NO FUNGUS ISOLATED AFTER 9 DAYS Performed at [facility], [place] [address], [place], [place] [address]  Report Status PENDING  Incomplete  Culture, blood (Routine X 2) w Reflex to ID Panel     Status: None   Collection Time: 01/29/20  3:11 PM   Specimen: BLOOD  Result Value Ref Range Status   Specimen Description BLOOD RIGHT ANTECUBITAL  Final   Special Requests   Final    BOTTLES DRAWN AEROBIC AND ANAEROBIC Blood Culture adequate volume   Culture   Final    NO GROWTH 5  DAYS Performed at [facility], [address], [place], [place] [address]    Report Status 02/03/2020 FINAL  Final  Culture, blood (Routine X 2) w Reflex to ID Panel     Status: None   Collection Time: 01/29/20  3:19 PM   Specimen: BLOOD RIGHT HAND  Result Value Ref Range Status   Specimen Description BLOOD RIGHT HAND  Final   Special Requests IN PEDIATRIC BOTTLE Blood Culture adequate volume  Final   Culture   Final    NO GROWTH 5 DAYS Performed at [facility], [place] [address] [place], [place] [address]    Report Status 02/03/2020 FINAL  Final    Radiology Reports CT Abdomen Pelvis Wo Contrast  Result Date: 01/23/2020 CLINICAL DATA:  Patient appears jaundiced history of lymphoma EXAM: CT ABDOMEN AND PELVIS WITHOUT CONTRAST TECHNIQUE: Multidetector CT imaging of the abdomen and pelvis was performed following the standard protocol without IV contrast. COMPARISON:  None. FINDINGS: Lower chest: The visualized heart size within normal limits. No pericardial fluid/thickening. No hiatal hernia. The visualized portions of the lungs are clear. Hepatobiliary: Although limited due to the lack of intravenous contrast, normal in appearance without gross focal abnormality. The patient is status post cholecystectomy. No biliary ductal dilation. Pancreas:  Unremarkable.  No surrounding inflammatory changes. 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 Adrenals/Urinary Tract: Both adrenal glands appear normal. The kidneys and collecting system appear normal without evidence of urinary tract calculus or hydronephrosis. Bladder is unremarkable. Stomach/Bowel: The stomach, small bowel, and colon are normal in appearance. No inflammatory changes or obstructive findings. Scattered colonic diverticula are noted without diverticulitis. Appendix is normal. Vascular/Lymphatic: There are no enlarged abdominal or pelvic lymph nodes. Scattered aortic  atherosclerotic calcifications are seen without aneurysmal dilatation. Reproductive: The uterus and adnexa are unremarkable. Other: No evidence of abdominal wall mass or hernia. Musculoskeletal: No acute or significant osseous findings. IMPRESSION: No acute intra-abdominal or pelvic pathology to explain the patient's symptoms. Diverticulosis without diverticulitis. Aortic Atherosclerosis (ICD10-I70.0). Electronically Signed   By: Prudencio Pair M.D.   On: 01/23/2020 22:03   CT Head Wo Contrast  Result Date: 01/23/2020 CLINICAL DATA:  Altered mental status. Left facial droop. EXAM: CT HEAD WITHOUT CONTRAST TECHNIQUE: Contiguous axial images were obtained from the base of the skull through the vertex without intravenous contrast. COMPARISON:  Brain MRI 11/24/2019 FINDINGS: Brain: No acute hemorrhage. No extra-axial or subdural collection. Mild generalized atrophy with moderate periventricular and deep white matter hypodensity consistent with chronic small vessel ischemia. This appears slightly asymmetric in the right cerebral hemisphere but no definite cortical infarct. Small calcification in left basal ganglia, likely senescent. No midline shift, hydrocephalus or suspicious mass effect. Vascular: Atherosclerosis of skullbase vasculature without hyperdense vessel or abnormal calcification. Skull: No fracture or focal lesion. Sinuses/Orbits: Chronic paranasal sinus disease with near complete opacification of frontal sinuses, ethmoid air cells, left sphenoid and maxillary sinus. Mucosal thickening in the right sphenoid and maxillary sinus.  The degree sinus opacification has slightly progressed from prior MRI. Mastoid air cells are clear. Other: None. IMPRESSION: 1. No hemorrhage or evidence of acute infarct. 2. Mild atrophy with moderate chronic small vessel ischemia. 3. Chronic paranasal sinus disease, slightly progressed from January MRI. Electronically Signed   By: Keith Rake M.D.   On: 01/23/2020 22:02    MR BRAIN WO CONTRAST  Result Date: 01/24/2020 CLINICAL DATA:  Initial evaluation for acute encephalopathy, altered mental status. History of leukemia, in remission. EXAM: MRI HEAD WITHOUT CONTRAST TECHNIQUE: Multiplanar, multiecho pulse sequences of the brain and surrounding structures were obtained without intravenous contrast. COMPARISON:  Prior CT from 01/23/2020 as well as previous MRI from 11/24/2019. FINDINGS: Brain: Examination somewhat technically limited by susceptibility artifact emanating from the scalp. Generalized age-related cerebral atrophy. Patchy T2/FLAIR hyperintensity within the periventricular and deep white matter both cerebral hemispheres again seen, nonspecific, but most like related chronic microvascular ischemic disease, stable from previous. No abnormal foci of restricted diffusion to suggest acute or subacute ischemia. Gray-white matter differentiation maintained. No encephalomalacia to suggest chronic cortical infarction. Diffuse FLAIR hyperintensity seen throughout the cerebral sulci, suspected to be artifactual nature due to overlying susceptibility artifact. Possible proteinaceous material not entirely excluded. No associated susceptibility artifact to suggest subarachnoid hemorrhage. No mass lesion, midline shift, or mass effect. Ventricles normal size without hydrocephalus. No appreciable intraventricular debris. No extra-axial fluid collection. No made of an empty sella. Midline structures intact. Vascular: Major intracranial vascular flow voids are maintained. Skull and upper cervical spine: Craniocervical junction within normal limits. Postsurgical changes partially visualize within the upper cervical spine. Bone marrow signal intensity heterogeneous and diffusely decreased on T1 weighted imaging, most like related history of leukemia. Susceptibility artifact involving the left greater than right scalp, of uncertain etiology. Sinuses/Orbits: Globes and orbital soft tissues  demonstrate no acute finding. Extensive opacity and mucosal thickening seen throughout the paranasal sinuses, with superimposed right maxillary sinus air-fluid level. Findings could be infectious and/or inflammatory nature. Trace bilateral mastoid effusions. Visualized inner ear structures grossly normal. Other: None. IMPRESSION: 1. Technically limited exam due to susceptibility artifact emanating from the scalp, of uncertain etiology. 2. Diffuse FLAIR hyperintensity throughout the cerebral sulci and surrounding the brainstem and cerebellum. While this finding could be artifactual in nature related to overlying susceptibility artifact, proteinaceous material and changes related to meningitis could also have this appearance. Correlation with LP and CSF studies recommended for correlated purposes as clinically warranted. No susceptibility artifact to suggest subarachnoid hemorrhage. 3. 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 4. Extensive pan sinusitis. This could be either infectious or inflammatory in nature. Electronically Signed   By: Jeannine Boga M.D.   On: 01/24/2020 06:46   US RENAL  Result Date: 01/29/2020 CLINICAL DATA:  Acute kidney injury EXAM: RENAL / URINARY TRACT ULTRASOUND COMPLETE COMPARISON:  None. FINDINGS: Right Kidney: Renal measurements: 11.1 x 6.2 x 6.6 cm = volume: Is 236 mL. There is mild pelviectasis without frank hydronephrosis. Left Kidney: Renal measurements: 12 x 6.5 x 6.3 cm = volume: 255 mL. There is mild pelviectasis without frank hydronephrosis. Bladder: Only the left ureteral jet was visualized. Other: None. IMPRESSION: 1. Mild bilateral pelviectasis without evidence for frank hydronephrosis. 2. Only the left ureteral jet was visualized on today's exam. 3. No shadowing echogenic stones identified on this study. Electronically Signed   By: Constance Holster M.D.   On: 01/29/2020 16:46   IR  Fluoro Guide  Ndl Plmt / BX  Result Date: 01/27/2020 CLINICAL DATA:  Confusion, altered mental status EXAM: DIAGNOSTIC LUMBAR PUNCTURE UNDER FLUOROSCOPIC GUIDANCE FLUOROSCOPY TIME:  0.1 minute; 12  uGym2 DAP PROCEDURE: Informed consent was obtained from the family prior to the procedure, including potential complications of headache, allergy, and pain. With the patient prone, the lower back was prepped with Betadine. Intravenous Fentanyl 64mcg and Versed 1mg  were administered as conscious sedation during continuous monitoring of the patient's level of consciousness and physiological / cardiorespiratory status by the radiology RN, with a total moderate sedation time of 35 minutes. 1% Lidocaine was used for local anesthesia. Lumbar puncture was performed at the L3-4 level from a right parasagittal approach using a 20 gauge needle with return of yellow cloudy CSF with an opening pressure of 13 cm water. 9 ml of CSF were obtained for laboratory studies. The patient tolerated the procedure well and there were no apparent complications. IMPRESSION: 1. Technically successful lumbar puncture under fluoroscopy Electronically Signed   By: Lucrezia Europe M.D.   On: 01/27/2020 07:59   DG CHEST PORT 1 VIEW  Result Date: 01/27/2020 CLINICAL DATA:  Shortness of breath. History of leukemia. EXAM: PORTABLE CHEST 1 VIEW COMPARISON:  01/27/2020 FINDINGS: Midline trachea. Borderline cardiomegaly. Atherosclerosis in the transverse aorta. No pleural effusion or pneumothorax. Surgical clips in the left upper quadrant. No congestive failure. No lobar consolidation. IMPRESSION: Borderline cardiomegaly, without acute disease. Aortic Atherosclerosis (ICD10-I70.0). Electronically Signed   By: Abigail Miyamoto M.D.   On: 01/27/2020 19:16   DG CHEST PORT 1 VIEW  Result Date: 01/27/2020 CLINICAL DATA:  SOB (shortness of breath) EXAM: PORTABLE CHEST - 1 VIEW COMPARISON:  the previous day's study FINDINGS: Relatively low lung volumes with  resultant crowding of bronchovascular structures. The mild interstitial edema seen previously continues to improve. No new airspace disease. Heart size within normal limits for technique. Aortic Atherosclerosis (ICD10-170.0). No effusion. No pneumothorax. Cervical fixation hardware partially visualized. Surgical clips in the upper abdomen. IMPRESSION: Improving interstitial edema. Electronically Signed   By: Lucrezia Europe M.D.   On: 01/27/2020 11:55   DG CHEST PORT 1 VIEW  Result Date: 01/26/2020 CLINICAL DATA:  Leukocytosis EXAM: PORTABLE CHEST 1 VIEW COMPARISON:  01/24/2020 FINDINGS: The cardiac silhouette, mediastinal and hilar contours are stable. Low lung volumes with vascular crowding and streaky atelectasis. Slight improved lung aeration since the prior study with less vascular congestion and resolving edema. No pleural effusions. IMPRESSION: 1. Low lung volumes with vascular crowding and streaky atelectasis. 2. Improved lung aeration. Electronically Signed   By: Marijo Sanes M.D.   On: 01/26/2020 07:34   DG CHEST PORT 1 VIEW  Result Date: 01/24/2020 CLINICAL DATA:  Shortness of breath and hypoxia. EXAM: PORTABLE CHEST 1 VIEW COMPARISON:  Radiograph yesterday. FINDINGS: Lower lung volumes from prior exam. Prominent heart size likely accentuated by technique. Bronchovascular crowding versus vascular congestion. No significant pleural effusion. No pneumothorax. No confluent airspace disease. No acute osseous abnormalities are seen. IMPRESSION: Lower lung volumes from prior exam. Bronchovascular crowding versus vascular congestion. Prominent heart size likely accentuated by technique and low lung volumes. Electronically Signed   By: Keith Rake M.D.   On: 01/24/2020 19:19   DG Chest Port 1 View  Result Date: 01/23/2020 CLINICAL DATA:  70 year old female with sepsis. EXAM: PORTABLE CHEST 1 VIEW COMPARISON:  Chest radiographs 12/19/2018 and earlier. FINDINGS: Portable AP semi upright view at 2045  hours. Larger lung volumes. Cardiac size at the upper limits of normal. Calcified  aortic atherosclerosis. Other mediastinal contours are within normal limits. Visualized tracheal air column is within normal limits. Allowing for portable technique the lungs are clear. Chronic left upper quadrant surgical clips and cervical ACDF. Negative visible bowel gas pattern. No acute osseous abnormality identified. IMPRESSION: No acute cardiopulmonary abnormality. Aortic Atherosclerosis (ICD10-I70.0). Electronically Signed   By: Genevie Ann M.D.   On: 01/23/2020 20:55   DG Shoulder Left  Result Date: 02/02/2020 CLINICAL DATA:  Left shoulder pain. EXAM: LEFT SHOULDER - 2+ VIEW COMPARISON:  December 19, 2018. FINDINGS: There is no evidence of fracture or dislocation. There is no evidence of arthropathy or other focal bone abnormality. Soft tissues are unremarkable. IMPRESSION: Negative. Electronically Signed   By: Marijo Conception M.D.   On: 02/02/2020 16:31   EEG adult  Result Date: 01/24/2020 Lora Havens, MD     01/24/2020 10:59 AM Patient Name: Kathleen Duran MRN: [id number] Epilepsy Attending: Lora Havens Referring Physician/Provider: Dr Amie [place] Date: 01/24/2020 Duration: 24.09 mins Patient history: 69 y.o.femalewith past medical history significant for leukemia, non-Hodgkin's lymphoma 2002 presented with altered mental status, fever, significantly elevated leukocytosis, with a rightward gaze preference. EEG to evaluate for status epilepticus. Level of alertness: lethargic AEDs during EEG study: Ativan, Keppra Technical aspects: This EEG study was done with scalp electrodes positioned according to the 10-20 International system of electrode placement. Electrical activity was acquired at a sampling rate of 500Hz  and reviewed with a high frequency filter of 70Hz  and a low frequency filter of 1Hz . EEG data were recorded continuously and digitally stored. DESCRIPTION: EEG showed continuous 3-4Hz  delta  slowing in right hemisphere. There is also continuous 5-8Hz  theta-alpha activity in left hemisphere with intermittent 2-3hz  delta activity..  Hyperventilation and photic stimulation were not performed. ABNORMALITY - Continuous slow, generalized and lateralized right hemisphere IMPRESSION: This study is suggestive of cortical dysfunction in right hemisphere likely secondary to post-ictal state, underlying structural abnormality.  No seizures or definite epileptiform discharges were seen throughout the recording. Dr Rory Percy was notified. Priyanka Barbra Sarks   Overnight EEG with video  Result Date: 01/24/2020 Lora Havens, MD     01/25/2020 10:07 AM Patient Name: Kathleen Duran MRN: [id number] Epilepsy Attending: Lora Havens Referring Physician/Provider: Dr Amie Portland Duration: 01/24/2020 [id number] to 01/25/2020 [id number]  Patient history: 69 y.o.femalewith past medical history significant for leukemia, non-Hodgkin's lymphoma 2002 presented with altered mental status, fever, significantly elevated leukocytosis, with a rightward gaze preference. EEG to evaluate for status epilepticus.  Level of alertness: lethargic  AEDs during EEG study: Ativan, Keppra  Technical aspects: This EEG study was done with scalp electrodes positioned according to the 10-20 International system of electrode placement. Electrical activity was acquired at a sampling rate of 500Hz  and reviewed with a high frequency filter of 70Hz  and a low frequency filter of 1Hz . EEG data were recorded continuously and digitally stored.  DESCRIPTION: EEG showed continuous 5-8Hz  theta-alpha activity in left hemisphere with intermittent 2-3hz  delta activity which at times appears quasi rhythmic. Sharp waves were also noted in left frontal region. Continuous 3-4Hz  low amplitude delta slowing was also noted in right hemisphere. Hyperventilation and photic stimulation were not performed.  ABNORMALITY - Sharp waves, left frontotemporal region -  Intermittent rhythmic delta activity, left frontal region - Continuous slow, generalized and lateralized right hemisphere  IMPRESSION: This study is suggestive of epileptogenicity and cortical dysfunction in left frontotemporal region. Quasi rhythmic delta activity with sharp waves in left frontotemporal region can be  on the ictal-interictal continuum. Additionally, there is evidence of cortical dysfunction in right hemisphere likely secondary to post-ictal state, underlying structural abnormality.  No definite seizures were seen throughout the recording. Lora Havens   ECHOCARDIOGRAM COMPLETE  Result Date: 01/30/2020    ECHOCARDIOGRAM REPORT   Patient Name:   Kathleen Duran Date of Exam: 01/30/2020 Medical Rec #:  [id number]            Height:       62.6 in Accession #:    [id number]           Weight:       176.4 lb Date of Birth:  January 08, 1951             BSA:          1.826 m Patient Age:    40 years             BP:           127/49 mmHg Patient Gender: F                    HR:           99 bpm. Exam Location:  Inpatient Procedure: 2D Echo Indications:    Bacteremia 790.7 / R78.81  History:        Patient has no prior history of Echocardiogram examinations.                 Risk Factors:Hypertension. Acute kidney                 Sinus tachycardia                 Sepsis                 Meningitis.  Sonographer:    Vikki Ports Turrentine Referring Phys: [id number] Bowler  1. Normal LV systolic function; grade 1 diastolic dysfunction; very mild AS (mean gradient 11 mmHg); mild AI; moderate LAE.  2. Left ventricular ejection fraction, by estimation, is 50 to 55%. The left ventricle has low normal function. The left ventricle has no regional wall motion abnormalities. Left ventricular diastolic parameters are consistent with Grade I diastolic dysfunction (impaired relaxation). Elevated left atrial pressure.  3. Right ventricular systolic function is normal. The right ventricular size is normal.   4. Left atrial size was moderately dilated.  5. The mitral valve is normal in structure. Trivial mitral valve regurgitation. No evidence of mitral stenosis.  6. The aortic valve is tricuspid. Aortic valve regurgitation is mild. Mild aortic valve stenosis.  7. The inferior vena cava is normal in size with greater than 50% respiratory variability, suggesting right atrial pressure of 3 mmHg. FINDINGS  Left Ventricle: Left ventricular ejection fraction, by estimation, is 50 to 55%. The left ventricle has low normal function. The left ventricle has no regional wall motion abnormalities. The left ventricular internal cavity size was normal in size. There is no left ventricular hypertrophy. Left ventricular diastolic parameters are consistent with Grade I diastolic dysfunction (impaired relaxation). Elevated left atrial pressure. Right Ventricle: The right ventricular size is normal. Right ventricular systolic function is normal. Left Atrium: Left atrial size was moderately dilated. Right Atrium: Right atrial size was normal in size. Pericardium: Trivial pericardial effusion is present. Mitral Valve: The mitral valve is normal in structure. Normal mobility of the mitral valve leaflets. Mild mitral annular calcification. Trivial mitral valve regurgitation. No evidence of mitral valve stenosis.  Tricuspid Valve: The tricuspid valve is normal in structure. Tricuspid valve regurgitation is trivial. No evidence of tricuspid stenosis. Aortic Valve: The aortic valve is tricuspid. Aortic valve regurgitation is mild. Aortic regurgitation PHT measures 349 msec. Mild aortic stenosis is present. Aortic valve mean gradient measures 11.3 mmHg. Aortic valve peak gradient measures 15.7 mmHg. Aortic valve area, by VTI measures 1.50 cm. Pulmonic Valve: The pulmonic valve was normal in structure. Pulmonic valve regurgitation is not visualized. No evidence of pulmonic stenosis. Aorta: The aortic root is normal in size and structure. Venous:  The inferior vena cava is normal in size with greater than 50% respiratory variability, suggesting right atrial pressure of 3 mmHg.  Additional Comments: Normal LV systolic function; grade 1 diastolic dysfunction; very mild AS (mean gradient 11 mmHg); mild AI; moderate LAE.  LEFT VENTRICLE PLAX 2D LVIDd:         4.60 cm  Diastology LVIDs:         3.20 cm  LV e' lateral:   6.53 cm/s LV PW:         1.10 cm  LV E/e' lateral: 13.2 LV IVS:        1.10 cm  LV e' medial:    5.00 cm/s LVOT diam:     1.70 cm  LV E/e' medial:  17.2 LV SV:         51 LV SV Index:   28 LVOT Area:     2.27 cm  RIGHT VENTRICLE RV S prime:     22.00 cm/s TAPSE (M-mode): 2.9 cm LEFT ATRIUM             Index LA diam:        4.20 cm 2.30 cm/m LA Vol (A2C):   78.5 ml 43.00 ml/m LA Vol (A4C):   82.4 ml 45.14 ml/m LA Biplane Vol: 81.0 ml 44.37 ml/m  AORTIC VALVE AV Area (Vmax):    1.50 cm AV Area (Vmean):   1.30 cm AV Area (VTI):     1.50 cm AV Vmax:           198.00 cm/s AV Vmean:          159.667 cm/s AV VTI:            0.343 m AV Peak Grad:      15.7 mmHg AV Mean Grad:      11.3 mmHg LVOT Vmax:         131.00 cm/s LVOT Vmean:        91.700 cm/s LVOT VTI:          0.226 m LVOT/AV VTI ratio: 0.66 AI PHT:            349 msec  AORTA Ao Root diam: 3.20 cm MITRAL VALVE MV Area (PHT): 7.02 cm     SHUNTS MV Decel Time: 108 msec     Systemic VTI:  0.23 m MV E velocity: 85.90 cm/s   Systemic Diam: 1.70 cm MV A velocity: 139.00 cm/s MV E/A ratio:  0.62 Kirk Ruths MD Electronically signed by Kirk Ruths MD Signature Date/Time: 01/30/2020/3:37:01 PM    Final      Vernell Leep, MD, FACP, [facility]. [facility]  To contact the attending provider between 7A-7P or the covering provider during after hours 7P-7A, please log into the web site [email or web address] and access using universal  password for that web site. If you do not have the password, please call the hospital operator.

## 2020-02-04 NOTE — Progress Notes (Addendum)
Occupational Therapy Treatment Patient Details Name: Kathleen Duran MRN: [id number] DOB: [birth year] Today's Date: 02/04/2020    History of present illness Patient is a 69 y/o female who presents with AMS, fever, nausea, vomiting and progressive agitation and confusion. 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   OT comments  Pt seen for OT follow up, now plans to go home with husband instead of CIR. Pt presents with significant cognitive deficits that impact safety and risk of falls with BADLs in the home. Pt shows deficits in memory, awareness, safety, problem solving and attention. Attempted cause and effect safety questions with pt with LOB in hall to raise insight into falls. Pt states "well I guess I will just fall and hit my head and get a concussion" without much concern or aware of danger. When asked to motor plan walking backwards, pt states "I don't know what backwards means". Pt also asked OT if she also sees "variations of what's on the TV". Throughout session pt attempting to tell the same story but taking several minute breaks in between comments without much awareness. All cognitive concerns are an extreme safety risk going home, an increase fall, and re admission increase. Pt also requires significant physical assist with all functional mobility and transfers. Toilet transfer was completed at mod A with RW and safety cues. General functional mobility is completed at mod A with continued safety cues to navigate obstacles and maintain balance. Pt also is having L shoulder limitations in flexion and external rotation, will need to follow up with orthopedic specialist for suspected RC tear/other soft tissue involvement. Given current status, continue to recommend CIR at d/c for patient. Attempted to discuss this with pt, and she stated she just cannot stay here any longer. If this is the result, pt will need  maxed out [facility] services at d/c. Will continue to follow acutely.   Follow Up Recommendations  CIR;Supervision/Assistance - 24 hour;Other (comment)(continue to highly recommend CIR despite pt refusal)    Equipment Recommendations  3 in 1 bedside commode    Recommendations for Other Services      Precautions / Restrictions Precautions Precautions: Fall Restrictions Weight Bearing Restrictions: No       Mobility Bed Mobility Overal bed mobility: Needs Assistance Bed Mobility: Rolling;Sidelying to Sit Rolling: Max assist Sidelying to sit: Max assist;+2 for safety/equipment       General bed mobility comments: multimodal cues for sequencing; max A for all bed mobility given poor motor planning. Pt attempting to turn to opposite side of bed transfer was planned for.  Transfers Overall transfer level: Needs assistance Equipment used: Rolling walker (2 wheeled) Transfers: Sit to/from Stand Sit to Stand: Mod assist;Min assist         General transfer comment: assistance to power up into standing and to maintain balance in standing; cues for safe hand placement; pt assisted L UE to reach RW grip with use of R UE    Balance Overall balance assessment: Needs assistance Sitting-balance support: Bilateral upper extremity supported;Feet supported Sitting balance-Leahy Scale: Poor     Standing balance support: Bilateral upper extremity supported;During functional activity Standing balance-Leahy Scale: Poor Standing balance comment: requires use of RW and fatigues with progressive movement. One LOB in hallway                           ADL either performed or assessed with clinical judgement  ADL Overall ADL's : Needs assistance/impaired                     Lower Body Dressing: Maximal assistance;Cueing for sequencing;Cueing for compensatory techniques   Toilet Transfer: Moderate assistance;Regular Toilet;Grab bars;RW Armed forces technical officer Details (indicate cue  type and reason): mod A to rise and steady off of low commode. Multimodal cues for safety and controlled descent         Functional mobility during ADLs: Moderate assistance;Rolling walker;Cueing for sequencing;Cueing for safety General ADL Comments: significant cognitive impairments     Vision Patient Visual Report: No change from baseline     Perception     Praxis      Cognition Arousal/Alertness: Awake/alert Behavior During Therapy: WFL for tasks assessed/performed Overall Cognitive Status: Impaired/Different from baseline Area of Impairment: Memory;Safety/judgement;Problem solving;Attention;Following commands;Awareness                   Current Attention Level: Sustained Memory: Decreased short-term memory;Decreased recall of precautions Following Commands: Follows one step commands with increased time;Follows one step commands inconsistently Safety/Judgement: Decreased awareness of safety;Decreased awareness of deficits Awareness: Intellectual Problem Solving: Decreased initiation;Requires verbal cues;Difficulty sequencing;Slow processing;Requires tactile cues General Comments: pt with significant cognitive deficits into current situation. Attempted cause and effect home safety questions without much buy in or awareness of danger to patient. Pt also having extreme difficulty with attention, will start a story and stop it several times or never finish it. Overall problem solving is impacted, when pt was asked to walk backwards she states "I dont know what that means"        Exercises     Shoulder Instructions       General Comments      Pertinent Vitals/ Pain       Pain Assessment: Faces Faces Pain Scale: Hurts little more Pain Location: back Pain Descriptors / Indicators: Sore Pain Intervention(s): Monitored during session  Home Living                                          Prior Functioning/Environment              Frequency   Min 3X/week        Progress Toward Goals  OT Goals(current goals can now be found in the care plan section)  Progress towards OT goals: Progressing toward goals  Acute Rehab OT Goals Patient Stated Goal: to get back to independence OT Goal Formulation: With patient Time For Goal Achievement: 02/16/20 Potential to Achieve Goals: Good  Plan Discharge plan remains appropriate    Co-evaluation    PT/OT/SLP Co-Evaluation/Treatment: Yes Reason for Co-Treatment: Necessary to address cognition/behavior during functional activity;For patient/therapist safety;To address functional/ADL transfers PT goals addressed during session: Mobility/safety with mobility OT goals addressed during session: ADL's and self-care      AM-PAC OT "6 Clicks" Daily Activity     Outcome Measure   Help from another person eating meals?: A Little Help from another person taking care of personal grooming?: A Lot Help from another person toileting, which includes using toliet, bedpan, or urinal?: A Lot Help from another person bathing (including washing, rinsing, drying)?: A Lot Help from another person to put on and taking off regular upper body clothing?: A Lot Help from another person to put on and taking off regular lower body clothing?: A Lot 6 Click Score: 13  End of Session Equipment Utilized During Treatment: Gait belt;Rolling walker  OT Visit Diagnosis: Unsteadiness on feet (R26.81);Muscle weakness (generalized) (M62.81);Pain;Other symptoms and signs involving cognitive function Pain - Right/Left: Left Pain - part of body: Shoulder   Activity Tolerance Patient tolerated treatment well   Patient Left in chair;with call bell/phone within reach;with nursing/sitter in room   Nurse Communication Mobility status        Time: [id number] OT Time Calculation (min): 44 min  Charges: OT General Charges $OT Visit: 1 Visit OT Treatments $Self Care/Home Management : 8-22 mins  Zenovia Jarred,  MSOT, OTR/L [facility] Office Number: [phone] Pager: [phone]  Zenovia Jarred 02/04/2020, 5:56 PM

## 2020-02-05 DIAGNOSIS — K219 Gastro-esophageal reflux disease without esophagitis: Secondary | ICD-10-CM

## 2020-02-05 LAB — GLUCOSE, CAPILLARY
Glucose-Capillary: 84 mg/dL (ref 70–99)
Glucose-Capillary: 94 mg/dL (ref 70–99)
Glucose-Capillary: 96 mg/dL (ref 70–99)

## 2020-02-05 LAB — CBC
HCT: 30.3 % — ABNORMAL LOW (ref 36.0–46.0)
Hemoglobin: 9.9 g/dL — ABNORMAL LOW (ref 12.0–15.0)
MCH: 27 pg (ref 26.0–34.0)
MCHC: 32.7 g/dL (ref 30.0–36.0)
MCV: 82.8 fL (ref 80.0–100.0)
Platelets: 594 10*3/uL — ABNORMAL HIGH (ref 150–400)
RBC: 3.66 MIL/uL — ABNORMAL LOW (ref 3.87–5.11)
RDW: 15.9 % — ABNORMAL HIGH (ref 11.5–15.5)
WBC: 16 10*3/uL — ABNORMAL HIGH (ref 4.0–10.5)
nRBC: 0 % (ref 0.0–0.2)

## 2020-02-05 LAB — BASIC METABOLIC PANEL
Anion gap: 9 (ref 5–15)
BUN: 12 mg/dL (ref 8–23)
CO2: 27 mmol/L (ref 22–32)
Calcium: 8.9 mg/dL (ref 8.9–10.3)
Chloride: 107 mmol/L (ref 98–111)
Creatinine, Ser: 0.62 mg/dL (ref 0.44–1.00)
GFR calc Af Amer: >60 mL/min (ref >60)
GFR calc non Af Amer: >60 mL/min (ref >60)
Glucose, Bld: 97 mg/dL (ref 70–99)
Potassium: 3.9 mmol/L (ref 3.5–5.1)
Sodium: 143 mmol/L (ref 135–145)

## 2020-02-05 MED ORDER — LEVETIRACETAM 750 MG PO TABS: 750.0000 mg | ORAL_TABLET | Freq: Two times a day (BID) | ORAL | 0 refills | Status: AC

## 2020-02-05 MED ORDER — ACETAMINOPHEN 325 MG PO TABS: 625.0000 mg | ORAL_TABLET | Freq: Four times a day (QID) | ORAL | Status: AC | PRN

## 2020-02-05 MED ORDER — ENSURE ENLIVE PO LIQD: 237.0000 mL | Freq: Three times a day (TID) | ORAL | 12 refills | Status: AC

## 2020-02-05 MED ORDER — METOPROLOL TARTRATE 25 MG PO TABS: 37.5000 mg | ORAL_TABLET | Freq: Two times a day (BID) | ORAL | 0 refills | Status: AC

## 2020-02-05 NOTE — Discharge Instructions (Signed)
[place] [facility] Stay Proper nutrition can help your body recover from illness and injury.   Foods and beverages high in protein, vitamins, and minerals help rebuild muscle loss, promote healing, & reduce fall risk.   .In addition to eating healthy foods, a nutrition shake is an easy, delicious way to get the nutrition you need during and after your hospital stay  It is recommended that you continue to drink 2 bottles per day of:       Premier Protein for at least 1 month (30 days) after your hospital stay   Tips for adding a nutrition shake into your routine: As allowed, drink one with vitamins or medications instead of water or juice Enjoy one as a tasty mid-morning or afternoon snack Drink cold or make a milkshake out of it Drink one instead of milk with cereal or snacks Use as a coffee creamer   Available at the following grocery stores and pharmacies:           * [place] [phone]            For COUPONS visit: www.[email or web address]/join or [email or web address]   Suggested Substitutions Ensure Plus = Boost Plus = Carnation Breakfast Essentials = Boost Compact Ensure Active Clear = Boost Breeze Glucerna Shake = Boost Glucose Control = Carnation Breakfast Essentials SUGAR FREE    Additional discharge instructions  Please get your medications reviewed and adjusted by your Primary MD.  Please request your Primary MD to go over all Hospital Tests and Procedure/Radiological results at the follow up, please get all Hospital records sent to your Prim MD by signing hospital release before you go home.  If you had Pneumonia of Lung problems at the Hospital: Please get a 2 view Chest X ray done in 6-8 weeks after hospital discharge or sooner if instructed by your Primary MD.  If you have Congestive Heart  Failure: Please call your Cardiologist or Primary MD anytime you have any of the following symptoms:  1) 3 pound weight gain in 24 hours or 5 pounds in 1 week  2) shortness of breath, with or without a dry hacking cough  3) swelling in the hands, feet or stomach  4) if you have to sleep on extra pillows at night in order to breathe  Follow cardiac low salt diet and 1.5 lit/day fluid restriction.  If you have diabetes Accuchecks 4 times/day, Once in AM empty stomach and then before each meal. Log in all results and show them to your primary doctor at your next visit. If any glucose reading is under 80 or above 300 call your primary MD immediately.  If you have Seizure/Convulsions/Epilepsy: Please do not drive, operate heavy machinery, participate in activities at heights or participate in high speed sports until you have seen by Primary MD or a Neurologist and advised to do so again.  If you had Gastrointestinal Bleeding: Please ask your Primary MD to check a complete blood count within one week of discharge or at your next visit. Your endoscopic/colonoscopic biopsies that are pending at the time of discharge, will also need to followed by your Primary MD.  Get Medicines reviewed and adjusted. Please take all your medications with  you for your next visit with your Primary MD  Please request your Primary MD to go over all hospital tests and procedure/radiological results at the follow up, please ask your Primary MD to get all Hospital records sent to his/her office.  If you experience worsening of your admission symptoms, develop shortness of breath, life threatening emergency, suicidal or homicidal thoughts you must seek medical attention immediately by calling 911 or calling your MD immediately  if symptoms less severe.  You must read complete instructions/literature along with all the possible adverse reactions/side effects for all the Medicines you take and that have been prescribed to  you. Take any new Medicines after you have completely understood and accpet all the possible adverse reactions/side effects.   Do not drive or operate heavy machinery when taking Pain medications.   Do not take more than prescribed Pain, Sleep and Anxiety Medications  Special Instructions: If you have smoked or chewed Tobacco  in the last 2 yrs please stop smoking, stop any regular Alcohol  and or any Recreational drug use.  Wear Seat belts while driving.  Please note You were cared for by a hospitalist during your hospital stay. If you have any questions about your discharge medications or the care you received while you were in the hospital after you are discharged, you can call the unit and asked to speak with the hospitalist on call if the hospitalist that took care of you is not available. Once you are discharged, your primary care physician will handle any further medical issues. Please note that NO REFILLS for any discharge medications will be authorized once you are discharged, as it is imperative that you return to your primary care physician (or establish a relationship with a primary care physician if you do not have one) for your aftercare needs so that they can reassess your need for medications and monitor your lab values.  You can reach the hospitalist office at phone [phone] or fax [phone]   If you do not have a primary care physician, you can call [phone] for a physician referral.

## 2020-02-05 NOTE — Plan of Care (Signed)
Adequate for discharge.

## 2020-02-05 NOTE — Progress Notes (Signed)
SLP Cancellation Note  Patient Details Name: Kathleen Duran MRN: [id number] DOB: 1951/07/29   Cancelled treatment:       Reason Eval/Treat Not Completed: Patient at procedure or test/unavailable.  Pt was in the shower at the time that this SLP attempted to work with her.  Will f/u as schedule allows.    [name] [name] 02/05/2020, 3:46 PM

## 2020-02-05 NOTE — Discharge Summary (Signed)
Physician Discharge Summary  Kathleen Duran:[id number] DOB: Apr 05, 1951  PCP: Arlyss Repress, MD  Admitted from: Home Discharged to: Home  Admit date: 01/23/2020 Discharge date: 02/05/2020  Recommendations for Outpatient Follow-up:   Follow-up Information    Home, [facility] Follow up.   Specialty: Home Health Services Why: Representative from [facility] will call you to schedule start of home health services. Contact information: [place] [address] [phone]        Michel Bickers, MD Follow up on 02/23/2020.   Specialty: Infectious Diseases Why: 2 pm. Contact information: [place] [address] [phone]        Arlyss Repress, MD. Schedule an appointment as soon as possible for a visit in 1 week(s).   Specialty: Internal Medicine Why: To be seen with repeat labs (CBC & BMP). Contact information: [place] [address] [phone]        Nahser, Wonda Cheng, MD Follow up on 03/02/2020.   Specialty: Cardiology Why: 8:40 am. Contact information: [place] [address] [phone]        [facility]. Schedule an appointment as soon as possible for a visit in 2 week(s).   Contact information: Address: [address] [place] [address]  Phone number: [phone]           Home Health: PT, OT and speech therapy Equipment/Devices: Shower stool  Discharge Condition: Improved and stable CODE STATUS: Full Diet recommendation: Heart healthy diet  Discharge Diagnoses:  Principal Problem:   Pneumococcal meningitis Active Problems:   Sepsis ([place])   Essential hypertension   AKI (acute kidney injury) ([place])   Dehydration with hypernatremia   Bacteremia due to Streptococcus pneumoniae   Sinus tachycardia   Chronic pansinusitis   Interstitial lung disease (H[facility])   GERD (gastroesophageal reflux  disease)   History of chronic lymphocytic leukemia   Status post splenectomy   Unintentional weight loss   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 year old female with medical history significant for promyelocytic leukemia under remission, hypertension, GERD  who presented on 01/23/2020 with fever, sudden confusion x 24 hours and vomiting.  In the ED, patient was febrile with a temperature of 101 F, tachycardic and lab work showed leukocytosis with WBC count of 78K, creatinine up to 3.09 which was around normal in October 2020.  On exam she was found to be altered and had neck rigidity.  CT head and CT abdomen and pelvis were unremarkable.  LFTs were elevated with AST of 161 and ALT of 240, total bilirubin 0.7.  Chest x-ray did not show anything acute.  Covid testing was negative.  Urine microscopy showed some hyaline casts, WBC of 11-20 with trace leukocytes.  Lumbar puncture was attempted but not successful.  Empiric antibiotics were started for sepsis due to possible meningoencephalitis and she was admitted for further work-up.  Hospital course complicated by acute mental status changes i.e. acute metabolic encephalopathy due to severe meningitis and multiple metabolic derangements.  Neurology, ID consulted.  She made gradual improvement   A & P   Acute metabolic encephalopathy secondary to sepsis with multiorgan dysfunction, from severe bacteremic pneumococcal meningitis and pansinusitis (all POA) in an asplenic patient. -Met  sepsis criteria on admission.  Initial attempt for LP in ED unsuccessful.  Patient was empirically started on empiric antibiotics for meningitis (vancomycin, meropenem and acyclovir). -Neurology was consulted, stat EEG was ordered to rule out nonconvulsive status epilepticus, loaded with 1.5 g  of Keppra, lumbar puncture under fluoroscopy requested, acyclovir empirically added. -3/4 blood cultures positive for Streptococcus pneumonia. -MRI brain without contrast 3/27: Technically limited study.  Diffuse FLAIR hyperintensity throughout the cerebral sulci and surrounding the brainstem and cerebellum.  While this finding could be artifactual in nature related to overlying susceptibility artifact, proteinaceous material and changes related to meningitis could also have this appearance.  No susceptibility artifact to suggest SAH.  Extensive pansinusitis. -EEG 3/27: Study suggestive of cortical dysfunction in right hemisphere likely secondary to postictal state, underlying structural abnormality.  No seizures or definite epileptiform discharges were seen throughout the recording. -LTM EEG 3/27: Study suggestive of epileptogenic city and cortical dysfunction in the left frontotemporal region.  Quasirhythmic delta activity with sharp waves in left frontotemporal region can be on the ictal-interictal continuum.  Additionally there is evidence of cortical dysfunction in right hemisphere likely secondary to postictal state, underlying structural abnormality.  No definite seizures were seen throughout the recording. -Neurology continue to follow and indicated that EEG not suggestive of nonconvulsive status epilepticus but showed cortical irritability bilaterally.  Keppra doses were adjusted. -On 3/29, underwent LP under fluoroscopy by IR, opening pressure 13 cm of water, 9 mL cloudy pale yellow fluid removed for labs.  CSF glucose severely decreased, WBC markedly elevated at 30,750 and predominantly neutrophilic, protein markedly elevated at 292.  Findings consistent with bacterial meningitis.  1 and HSV 2 DNA were negative.  IV acyclovir ampicillin and vancomycin were discontinued, continued meropenem. -On day 3, mental status starting to improve.  However approximately day 5, patient alert but very confused,  agitated and threatening to leave AMA but not competent to sign papers.  Neurology signed off on 3/31.  Patient had to be IVC'd -She was n.p.o. for a couple of days early on in admission likely related to significant mental status changes.  Speech therapy evaluated on 4/2 and recommended regular diet and thin liquids. -Around a week after admission, she spiked fever, was tachycardic on meropenem monotherapy, repeat blood cultures were showing clearance, TTE showed no vegetations, there were no other obvious signs of infection.  Due to the fevers, ID was consulted on 4/3 and agreed that she had severe bacteremic pneumococcal meningitis due to her risk factors of prior splenectomy and pansinusitis.  They recommended continuing meropenem for 14 days given the severity of her infection and presence of pansinusitis they did not think that TEE was warranted at this time.  They felt that the recurrent fever may be related to recent infiltration of an IV in her left arm without clear evidence of thrombophlebitis. -Rehab MD saw her on 4/5 and felt that she was in excellent candidate for CIR.  They recommended diclofenac gel for left shoulder pain.  ID was not sure as to why she was having left shoulder pain and she did not appear to have clinical evidence of septic arthritis.  She informed ID MD that she wondered if she fell and hurt it at home.  X-ray was nonacute. -CSF culture was negative.  On 4/6, no longer under IVC.  Repeat blood cultures from 4/1 documented clearance -Rehab team followed up with patient and she was not interested in inpatient rehab and preferred to go home.  Spouse was  in agreement. -Acute encephalopathy and sepsis resolved.  She completed course of IV meropenem on day of discharge.  ID arranged outpatient follow-up on 4/26.  Sinus tachycardia. Has LBBB on admission, per EKG in [place] shows chronic left anterior fascicular block, improving.  Remains asymptomatic.  Clinically  euvolemic.  --increase Lopressor to 37.5 mg twice daily (takes 50 mg qd at home) -Telemetry showed SR with BBB morphology.  Left shoulder pain, reproducible on exam early on in admission.  X-ray nonacute. -Did not complain of shoulder pain for the last 2 days.  Treated with diclofenac gel here.  Could use Tylenol as needed at home.  Had been mostly using Tylenol and occasional tramadol (total 2 doses)  Coccygeal pain.  Has pressure injury grade 1, with some unroofing of skin. -Continue Mepilex dressing -Continue to offload, turn as needed -Warm compresses to the apply to area --No pain reported to me over the last 2 days.  Tylenol as needed for pain.  Mild aortic stenosis. Confirmed on TTE no vegetations. Mean gradient 11 mm Hg -ID does not recommend TEE --outpatient follow up with [facility]  Chronic diastolic grade 1 CHF. On TTE. With mild AS above noted. No O2 requirements, doesn't look volume overload. CXR on 3/30 with no congeston  Elevated LFTs, acute liver injury in the setting of sepsis, resolved Peak ALT of 240, and AST 161.  Now back to normal baseline. --held home atorvastatin on admission which was resumed at discharge.  Follow LFTs closely as outpatient.  Discrete skin discoloration on left leg, that extends to upper abdomen, stable Husband at bedside reported that this is a birthmark. -Closely monitor  LTM concerning for epileptogenicity and cortical dysfunction in left frontotemporal region No definite seizures seen on LTM EEG recording -Neurology recommends continuing Keppra 750 mg twice daily, consider outpatient Neurology follow-up to determine further management including duration of AEDs.  Hypernatremia, in the setting of diminished p.o. intake, resolved -Treated with D5 infusion.  Resolved.  AKI, resolved. Multifactorial etiology: contrast/diminished p.o. intake/urinary retention/briefly on vancomycin. Peak Cr  3.91 on 4/1 now back to  baseline. -She was treated with IV fluids.  Was on Foley catheter for urinary retention which was discontinued and she is voiding without difficulty.   -RUS showed mild bilateral pelviectasis without hydronephrosis -Given recent AKI, continue to hold irbesartan until close outpatient follow-up with BMP with PCP.  Mild Bilateral Pelviectasis without hydronephrosis. Found on RUS during workup for AKI as patient was found to have acute urinary retention requiring foley.  -Acute urinary retention resolved.  HTN, at goal -lopressor increased to 37.5 mg BID ( home regimen 50 mg qd ) -Hold irbesartan until close outpatient follow-up with PCP.  GERD, stable -Continue home PPI  Leukocytosis/history of B-cell prolymphocytic leukemia, status post splenectomy and chemotherapy -Oncology/Dr. Marin Olp evaluated patient.  He indicated that she had a remote history of prolymphocytic leukemia and had been in remission after treatment.  She is s/p splenectomy and hence at risk for encapsulated organism infection.  He was hopeful that her vaccinations are up-to-date at [facility]. -Blood smears suggested reactive leukocytosis. -Leukocytosis gradually improved at time of discharge.  Follow CBC as outpatient.  Interstitial lung disease, chronic interstitial pneumonitis.  On room air, no respiratory symptoms. No history of structural lung disease.  Undergoing work-up for hypersensitivity pneumonitis as outpatient. No longer on prednisone - Followed by [facility] --incentive spirometry, flutter valve, Out of bed to chair with assistance  Anemia: Likely due to acute  illness.  No bleeding reported.  Follow CBC as outpatient.    Consultations:  Neurology  Oncology  Infectious disease  Interventional radiology  Rehab MD  Procedures:  3/29, LP under fluoroscopy.  Spot EEG and LTM EEG   Discharge Instructions  Discharge Instructions    Ambulatory referral to Neurology   Complete  by: As directed    An appointment is requested in approximately: 2 weeks   Diet - low sodium heart healthy   Complete by: As directed    Increase activity slowly   Complete by: As directed        Medication List    STOP taking these medications   azelastine 0.1 % nasal spray Commonly known as: ASTELIN   Carbinoxamine Maleate 4 MG Tabs   docusate sodium 100 MG capsule Commonly known as: Colace   irbesartan 300 MG tablet Commonly known as: AVAPRO   oxyCODONE-acetaminophen 5-325 MG tablet Commonly known as: Roxicet   Potassium 99 MG Tabs   predniSONE 10 MG tablet Commonly known as: DELTASONE   ranitidine 150 MG tablet Commonly known as: ZANTAC     TAKE these medications   acetaminophen 325 MG tablet Commonly known as: TYLENOL Take 2 tablets (650 mg total) by mouth every 6 (six) hours as needed for mild pain, moderate pain, fever or headache.   aspirin 81 MG tablet Take 81 mg by mouth daily.   atorvastatin 40 MG tablet Commonly known as: LIPITOR Take 40 mg by mouth at bedtime.   Biotin Maximum Strength 10 MG Tabs Generic drug: Biotin Take 10 mg by mouth daily.   CALCIUM 600 + D PO Take 1 tablet by mouth daily.   clobetasol cream 0.05 % Commonly known as: TEMOVATE Apply 1 application topically daily as needed (rash).   feeding supplement (ENSURE ENLIVE) Liqd Take 237 mLs by mouth 3 (three) times daily between meals.   FIBER-CAPS PO Take 1 capsule by mouth as needed (mild constipation).   Fluocinonide 0.1 % Crea Apply 1 application topically as needed. For irritation   fluticasone 50 MCG/ACT nasal spray Commonly known as: FLONASE Place 2 sprays into both nostrils daily.   ipratropium 0.06 % nasal spray Commonly known as: ATROVENT Place 2 sprays into the nose daily.   levETIRAcetam 750 MG tablet Commonly known as: KEPPRA Take 1 tablet (750 mg total) by mouth 2 (two) times daily.   metoprolol tartrate 25 MG tablet Commonly known as:  LOPRESSOR Take 1.5 tablets (37.5 mg total) by mouth 2 (two) times daily. What changed:   medication strength  how much to take  when to take this   minoxidil 2 % external solution Commonly known as: ROGAINE Apply 1 mL topically 1 day or 1 dose.   multivitamin with minerals Tabs tablet Take 1 tablet by mouth daily.   mupirocin ointment 2 % Commonly known as: BACTROBAN Apply 1 application topically as directed. Daily in nose   omeprazole-sodium bicarbonate 40-1100 MG capsule Commonly known as: ZEGERID Take 1 capsule by mouth daily before breakfast.   vitamin C 500 MG tablet Commonly known as: ASCORBIC ACID Take 500 mg by mouth daily.   vitamin E 180 MG (400 UNITS) capsule Take 400 Units by mouth daily.      Allergies  Allergen Reactions   Cephalosporins Other (See Comments)    Patient has had hives and rash to cephalexin. Last time patient has received a cephalosporin type antibiotic was prior to the year 2000.   Contrast Media [Iodinated Diagnostic Agents]  Hives and Shortness Of Breath   Penicillins Hives    Patient has had hives and rash to amoxicillin and penicillin. Last time patient has received a penicillin type antibiotic was prior to the year 2000.   Codeine Nausea And Vomiting   Iodine Rash      Procedures/Studies: CT Abdomen Pelvis Wo Contrast  Result Date: 01/23/2020 CLINICAL DATA:  Patient appears jaundiced history of lymphoma EXAM: CT ABDOMEN AND PELVIS WITHOUT CONTRAST TECHNIQUE: Multidetector CT imaging of the abdomen and pelvis was performed following the standard protocol without IV contrast. COMPARISON:  None. FINDINGS: Lower chest: The visualized heart size within normal limits. No pericardial fluid/thickening. No hiatal hernia. The visualized portions of the lungs are clear. Hepatobiliary: Although limited due to the lack of intravenous contrast, normal in appearance without gross focal abnormality. The patient is status post cholecystectomy.  No biliary ductal dilation. Pancreas:  Unremarkable.  No surrounding inflammatory changes. 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 Adrenals/Urinary Tract: Both adrenal glands appear normal. The kidneys and collecting system appear normal without evidence of urinary tract calculus or hydronephrosis. Bladder is unremarkable. Stomach/Bowel: The stomach, small bowel, and colon are normal in appearance. No inflammatory changes or obstructive findings. Scattered colonic diverticula are noted without diverticulitis. Appendix is normal. Vascular/Lymphatic: There are no enlarged abdominal or pelvic lymph nodes. Scattered aortic atherosclerotic calcifications are seen without aneurysmal dilatation. Reproductive: The uterus and adnexa are unremarkable. Other: No evidence of abdominal wall mass or hernia. Musculoskeletal: No acute or significant osseous findings. IMPRESSION: No acute intra-abdominal or pelvic pathology to explain the patient's symptoms. Diverticulosis without diverticulitis. Aortic Atherosclerosis (ICD10-I70.0). Electronically Signed   By: Prudencio Pair M.D.   On: 01/23/2020 22:03   CT Head Wo Contrast  Result Date: 01/23/2020 CLINICAL DATA:  Altered mental status. Left facial droop. EXAM: CT HEAD WITHOUT CONTRAST TECHNIQUE: Contiguous axial images were obtained from the base of the skull through the vertex without intravenous contrast. COMPARISON:  Brain MRI 11/24/2019 FINDINGS: Brain: No acute hemorrhage. No extra-axial or subdural collection. Mild generalized atrophy with moderate periventricular and deep white matter hypodensity consistent with chronic small vessel ischemia. This appears slightly asymmetric in the right cerebral hemisphere but no definite cortical infarct. Small calcification in left basal ganglia, likely senescent. No midline shift, hydrocephalus or suspicious mass effect. Vascular: Atherosclerosis of skullbase vasculature without  hyperdense vessel or abnormal calcification. Skull: No fracture or focal lesion. Sinuses/Orbits: Chronic paranasal sinus disease with near complete opacification of frontal sinuses, ethmoid air cells, left sphenoid and maxillary sinus. Mucosal thickening in the right sphenoid and maxillary sinus. The degree sinus opacification has slightly progressed from prior MRI. Mastoid air cells are clear. Other: None. IMPRESSION: 1. No hemorrhage or evidence of acute infarct. 2. Mild atrophy with moderate chronic small vessel ischemia. 3. Chronic paranasal sinus disease, slightly progressed from January MRI. Electronically Signed   By: Keith Rake M.D.   On: 01/23/2020 22:02   MR BRAIN WO CONTRAST  Result Date: 01/24/2020 CLINICAL DATA:  Initial evaluation for acute encephalopathy, altered mental status. History of leukemia, in remission. EXAM: MRI HEAD WITHOUT CONTRAST TECHNIQUE: Multiplanar, multiecho pulse sequences of the brain and surrounding structures were obtained without intravenous contrast. COMPARISON:  Prior CT from 01/23/2020 as well as previous MRI from 11/24/2019. FINDINGS: Brain: Examination somewhat technically limited by susceptibility artifact emanating from the scalp. Generalized age-related cerebral atrophy. Patchy T2/FLAIR hyperintensity within the periventricular and deep white matter both cerebral hemispheres  again seen, nonspecific, but most like related chronic microvascular ischemic disease, stable from previous. No abnormal foci of restricted diffusion to suggest acute or subacute ischemia. Gray-white matter differentiation maintained. No encephalomalacia to suggest chronic cortical infarction. Diffuse FLAIR hyperintensity seen throughout the cerebral sulci, suspected to be artifactual nature due to overlying susceptibility artifact. Possible proteinaceous material not entirely excluded. No associated susceptibility artifact to suggest subarachnoid hemorrhage. No mass lesion, midline shift,  or mass effect. Ventricles normal size without hydrocephalus. No appreciable intraventricular debris. No extra-axial fluid collection. No made of an empty sella. Midline structures intact. Vascular: Major intracranial vascular flow voids are maintained. Skull and upper cervical spine: Craniocervical junction within normal limits. Postsurgical changes partially visualize within the upper cervical spine. Bone marrow signal intensity heterogeneous and diffusely decreased on T1 weighted imaging, most like related history of leukemia. Susceptibility artifact involving the left greater than right scalp, of uncertain etiology. Sinuses/Orbits: Globes and orbital soft tissues demonstrate no acute finding. Extensive opacity and mucosal thickening seen throughout the paranasal sinuses, with superimposed right maxillary sinus air-fluid level. Findings could be infectious and/or inflammatory nature. Trace bilateral mastoid effusions. Visualized inner ear structures grossly normal. Other: None. IMPRESSION: 1. Technically limited exam due to susceptibility artifact emanating from the scalp, of uncertain etiology. 2. Diffuse FLAIR hyperintensity throughout the cerebral sulci and surrounding the brainstem and cerebellum. While this finding could be artifactual in nature related to overlying susceptibility artifact, proteinaceous material and changes related to meningitis could also have this appearance. Correlation with LP and CSF studies recommended for correlated purposes as clinically warranted. No susceptibility artifact to suggest subarachnoid hemorrhage. 3. 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 4. Extensive pan sinusitis. This could be either infectious or inflammatory in nature. Electronically Signed   By: Jeannine Boga M.D.   On: 01/24/2020 06:46   US RENAL  Result Date: 01/29/2020 CLINICAL DATA:  Acute kidney injury EXAM: RENAL /  URINARY TRACT ULTRASOUND COMPLETE COMPARISON:  None. FINDINGS: Right Kidney: Renal measurements: 11.1 x 6.2 x 6.6 cm = volume: Is 236 mL. There is mild pelviectasis without frank hydronephrosis. Left Kidney: Renal measurements: 12 x 6.5 x 6.3 cm = volume: 255 mL. There is mild pelviectasis without frank hydronephrosis. Bladder: Only the left ureteral jet was visualized. Other: None. IMPRESSION: 1. Mild bilateral pelviectasis without evidence for frank hydronephrosis. 2. Only the left ureteral jet was visualized on today's exam. 3. No shadowing echogenic stones identified on this study. Electronically Signed   By: Constance Holster M.D.   On: 01/29/2020 16:46   IR Fluoro Guide Ndl Plmt / BX  Result Date: 01/27/2020 CLINICAL DATA:  Confusion, altered mental status EXAM: DIAGNOSTIC LUMBAR PUNCTURE UNDER FLUOROSCOPIC GUIDANCE FLUOROSCOPY TIME:  0.1 minute; 12  uGym2 DAP PROCEDURE: Informed consent was obtained from the family prior to the procedure, including potential complications of headache, allergy, and pain. With the patient prone, the lower back was prepped with Betadine. Intravenous Fentanyl 28mg and Versed 126mwere administered as conscious sedation during continuous monitoring of the patient's level of consciousness and physiological / cardiorespiratory status by the radiology RN, with a total moderate sedation time of 35 minutes. 1% Lidocaine was used for local anesthesia. Lumbar puncture was performed at the L3-4 level from a right parasagittal approach using a 20 gauge needle with return of yellow cloudy CSF with an opening pressure of 13 cm water. 9 ml of CSF were obtained for laboratory studies. The patient  tolerated the procedure well and there were no apparent complications. IMPRESSION: 1. Technically successful lumbar puncture under fluoroscopy Electronically Signed   By: Lucrezia Europe M.D.   On: 01/27/2020 07:59   DG CHEST PORT 1 VIEW  Result Date: 01/27/2020 CLINICAL DATA:  Shortness of  breath. History of leukemia. EXAM: PORTABLE CHEST 1 VIEW COMPARISON:  01/27/2020 FINDINGS: Midline trachea. Borderline cardiomegaly. Atherosclerosis in the transverse aorta. No pleural effusion or pneumothorax. Surgical clips in the left upper quadrant. No congestive failure. No lobar consolidation. IMPRESSION: Borderline cardiomegaly, without acute disease. Aortic Atherosclerosis (ICD10-I70.0). Electronically Signed   By: Abigail Miyamoto M.D.   On: 01/27/2020 19:16   DG CHEST PORT 1 VIEW  Result Date: 01/27/2020 CLINICAL DATA:  SOB (shortness of breath) EXAM: PORTABLE CHEST - 1 VIEW COMPARISON:  the previous day's study FINDINGS: Relatively low lung volumes with resultant crowding of bronchovascular structures. The mild interstitial edema seen previously continues to improve. No new airspace disease. Heart size within normal limits for technique. Aortic Atherosclerosis (ICD10-170.0). No effusion. No pneumothorax. Cervical fixation hardware partially visualized. Surgical clips in the upper abdomen. IMPRESSION: Improving interstitial edema. Electronically Signed   By: Lucrezia Europe M.D.   On: 01/27/2020 11:55   DG CHEST PORT 1 VIEW  Result Date: 01/26/2020 CLINICAL DATA:  Leukocytosis EXAM: PORTABLE CHEST 1 VIEW COMPARISON:  01/24/2020 FINDINGS: The cardiac silhouette, mediastinal and hilar contours are stable. Low lung volumes with vascular crowding and streaky atelectasis. Slight improved lung aeration since the prior study with less vascular congestion and resolving edema. No pleural effusions. IMPRESSION: 1. Low lung volumes with vascular crowding and streaky atelectasis. 2. Improved lung aeration. Electronically Signed   By: Marijo Sanes M.D.   On: 01/26/2020 07:34   DG CHEST PORT 1 VIEW  Result Date: 01/24/2020 CLINICAL DATA:  Shortness of breath and hypoxia. EXAM: PORTABLE CHEST 1 VIEW COMPARISON:  Radiograph yesterday. FINDINGS: Lower lung volumes from prior exam. Prominent heart size likely  accentuated by technique. Bronchovascular crowding versus vascular congestion. No significant pleural effusion. No pneumothorax. No confluent airspace disease. No acute osseous abnormalities are seen. IMPRESSION: Lower lung volumes from prior exam. Bronchovascular crowding versus vascular congestion. Prominent heart size likely accentuated by technique and low lung volumes. Electronically Signed   By: Keith Rake M.D.   On: 01/24/2020 19:19   DG Chest Port 1 View  Result Date: 01/23/2020 CLINICAL DATA:  69 year old female with sepsis. EXAM: PORTABLE CHEST 1 VIEW COMPARISON:  Chest radiographs 12/19/2018 and earlier. FINDINGS: Portable AP semi upright view at 2045 hours. Larger lung volumes. Cardiac size at the upper limits of normal. Calcified aortic atherosclerosis. Other mediastinal contours are within normal limits. Visualized tracheal air column is within normal limits. Allowing for portable technique the lungs are clear. Chronic left upper quadrant surgical clips and cervical ACDF. Negative visible bowel gas pattern. No acute osseous abnormality identified. IMPRESSION: No acute cardiopulmonary abnormality. Aortic Atherosclerosis (ICD10-I70.0). Electronically Signed   By: Genevie Ann M.D.   On: 01/23/2020 20:55   DG Shoulder Left  Result Date: 02/02/2020 CLINICAL DATA:  Left shoulder pain. EXAM: LEFT SHOULDER - 2+ VIEW COMPARISON:  December 19, 2018. FINDINGS: There is no evidence of fracture or dislocation. There is no evidence of arthropathy or other focal bone abnormality. Soft tissues are unremarkable. IMPRESSION: Negative. Electronically Signed   By: Marijo Conception M.D.   On: 02/02/2020 16:31   EEG adult  Result Date: 01/24/2020 Lora Havens, MD     01/24/2020  10:59 AM Patient Name: ERIANA SULIMAN MRN: [id number] Epilepsy Attending: Lora Havens Referring Physician/Provider: Dr Amie [place] Date: 01/24/2020 Duration: 24.09 mins Patient history: 69 y.o.femalewith past medical  history significant for leukemia, non-Hodgkin's lymphoma 2002 presented with altered mental status, fever, significantly elevated leukocytosis, with a rightward gaze preference. EEG to evaluate for status epilepticus. Level of alertness: lethargic AEDs during EEG study: Ativan, Keppra Technical aspects: This EEG study was done with scalp electrodes positioned according to the 10-20 International system of electrode placement. Electrical activity was acquired at a sampling rate of 500Hz  and reviewed with a high frequency filter of 70Hz  and a low frequency filter of 1Hz . EEG data were recorded continuously and digitally stored. DESCRIPTION: EEG showed continuous 3-4Hz  delta slowing in right hemisphere. There is also continuous 5-8Hz  theta-alpha activity in left hemisphere with intermittent 2-3hz  delta activity..  Hyperventilation and photic stimulation were not performed. ABNORMALITY - Continuous slow, generalized and lateralized right hemisphere IMPRESSION: This study is suggestive of cortical dysfunction in right hemisphere likely secondary to post-ictal state, underlying structural abnormality.  No seizures or definite epileptiform discharges were seen throughout the recording. Dr Rory Percy was notified. Priyanka Barbra Sarks   Overnight EEG with video  Result Date: 01/24/2020 Lora Havens, MD     01/25/2020 10:07 AM Patient Name: TARREN SABREE MRN: [id number] Epilepsy Attending: Lora Havens Referring Physician/Provider: Dr Amie [place] Duration: 01/24/2020 3474 to 01/25/2020 2595  Patient history: 69 y.o.femalewith past medical history significant for leukemia, non-Hodgkin's lymphoma 2002 presented with altered mental status, fever, significantly elevated leukocytosis, with a rightward gaze preference. EEG to evaluate for status epilepticus.  Level of alertness: lethargic  AEDs during EEG study: Ativan, Keppra  Technical aspects: This EEG study was done with scalp electrodes positioned according to  the 10-20 International system of electrode placement. Electrical activity was acquired at a sampling rate of 500Hz  and reviewed with a high frequency filter of 70Hz  and a low frequency filter of 1Hz . EEG data were recorded continuously and digitally stored.  DESCRIPTION: EEG showed continuous 5-8Hz  theta-alpha activity in left hemisphere with intermittent 2-3hz  delta activity which at times appears quasi rhythmic. Sharp waves were also noted in left frontal region. Continuous 3-4Hz  low amplitude delta slowing was also noted in right hemisphere. Hyperventilation and photic stimulation were not performed.  ABNORMALITY - Sharp waves, left frontotemporal region - Intermittent rhythmic delta activity, left frontal region - Continuous slow, generalized and lateralized right hemisphere  IMPRESSION: This study is suggestive of epileptogenicity and cortical dysfunction in left frontotemporal region. Quasi rhythmic delta activity with sharp waves in left frontotemporal region can be on the ictal-interictal continuum. Additionally, there is evidence of cortical dysfunction in right hemisphere likely secondary to post-ictal state, underlying structural abnormality.  No definite seizures were seen throughout the recording. Lora Havens   ECHOCARDIOGRAM COMPLETE  Result Date: 01/30/2020    ECHOCARDIOGRAM REPORT   Patient Name:   AUBRI GATHRIGHT Myrie Date of Exam: 01/30/2020 Medical Rec #:  [id number]            Height:       62.6 in Accession #:    [id number]           Weight:       176.4 lb Date of Birth:  Sep 05, 1951             BSA:          1.826 m Patient Age:    29 years  BP:           127/49 mmHg Patient Gender: F                    HR:           99 bpm. Exam Location:  Inpatient Procedure: 2D Echo Indications:    Bacteremia 790.7 / R78.81  History:        Patient has no prior history of Echocardiogram examinations.                 Risk Factors:Hypertension. Acute kidney                 Sinus tachycardia                  Sepsis                 Meningitis.  Sonographer:    Vikki Ports Turrent[place] Referring Phys: [id number] [place]  1. Normal LV systolic function; grade 1 diastolic dysfunction; very mild AS (mean gradient 11 mmHg); mild AI; moderate LAE.  2. Left ventricular ejection fraction, by estimation, is 50 to 55%. The left ventricle has low normal function. The left ventricle has no regional wall motion abnormalities. Left ventricular diastolic parameters are consistent with Grade I diastolic dysfunction (impaired relaxation). Elevated left atrial pressure.  3. Right ventricular systolic function is normal. The right ventricular size is normal.  4. Left atrial size was moderately dilated.  5. The mitral valve is normal in structure. Trivial mitral valve regurgitation. No evidence of mitral stenosis.  6. The aortic valve is tricuspid. Aortic valve regurgitation is mild. Mild aortic valve stenosis.  7. The inferior vena cava is normal in size with greater than 50% respiratory variability, suggesting right atrial pressure of 3 mmHg. FINDINGS  Left Ventricle: Left ventricular ejection fraction, by estimation, is 50 to 55%. The left ventricle has low normal function. The left ventricle has no regional wall motion abnormalities. The left ventricular internal cavity size was normal in size. There is no left ventricular hypertrophy. Left ventricular diastolic parameters are consistent with Grade I diastolic dysfunction (impaired relaxation). Elevated left atrial pressure. Right Ventricle: The right ventricular size is normal. Right ventricular systolic function is normal. Left Atrium: Left atrial size was moderately dilated. Right Atrium: Right atrial size was normal in size. Pericardium: Trivial pericardial effusion is present. Mitral Valve: The mitral valve is normal in structure. Normal mobility of the mitral valve leaflets. Mild mitral annular calcification. Trivial mitral valve regurgitation. No  evidence of mitral valve stenosis. Tricuspid Valve: The tricuspid valve is normal in structure. Tricuspid valve regurgitation is trivial. No evidence of tricuspid stenosis. Aortic Valve: The aortic valve is tricuspid. Aortic valve regurgitation is mild. Aortic regurgitation PHT measures 349 msec. Mild aortic stenosis is present. Aortic valve mean gradient measures 11.3 mmHg. Aortic valve peak gradient measures 15.7 mmHg. Aortic valve area, by VTI measures 1.50 cm. Pulmonic Valve: The pulmonic valve was normal in structure. Pulmonic valve regurgitation is not visualized. No evidence of pulmonic stenosis. Aorta: The aortic root is normal in size and structure. Venous: The inferior vena cava is normal in size with greater than 50% respiratory variability, suggesting right atrial pressure of 3 mmHg.  Additional Comments: Normal LV systolic function; grade 1 diastolic dysfunction; very mild AS (mean gradient 11 mmHg); mild AI; moderate LAE.  LEFT VENTRICLE PLAX 2D LVIDd:         4.60 cm  Diastology LVIDs:  3.20 cm  LV e' lateral:   6.53 cm/s LV PW:         1.10 cm  LV E/e' lateral: 13.2 LV IVS:        1.10 cm  LV e' medial:    5.00 cm/s LVOT diam:     1.70 cm  LV E/e' medial:  17.2 LV SV:         51 LV SV Index:   28 LVOT Area:     2.27 cm  RIGHT VENTRICLE RV S prime:     22.00 cm/s TAPSE (M-mode): 2.9 cm LEFT ATRIUM             Index LA diam:        4.20 cm 2.30 cm/m LA Vol (A2C):   78.5 ml 43.00 ml/m LA Vol (A4C):   82.4 ml 45.14 ml/m LA Biplane Vol: 81.0 ml 44.37 ml/m  AORTIC VALVE AV Area (Vmax):    1.50 cm AV Area (Vmean):   1.30 cm AV Area (VTI):     1.50 cm AV Vmax:           198.00 cm/s AV Vmean:          159.667 cm/s AV VTI:            0.343 m AV Peak Grad:      15.7 mmHg AV Mean Grad:      11.3 mmHg LVOT Vmax:         131.00 cm/s LVOT Vmean:        91.700 cm/s LVOT VTI:          0.226 m LVOT/AV VTI ratio: 0.66 AI PHT:            349 msec  AORTA Ao Root diam: 3.20 cm MITRAL VALVE MV Area (PHT):  7.02 cm     SHUNTS MV Decel Time: 108 msec     Systemic VTI:  0.23 m MV E velocity: 85.90 cm/s   Systemic Diam: 1.70 cm MV A velocity: 139.00 cm/s MV E/A ratio:  0.62 Kirk Ruths MD Electronically signed by Kirk Ruths MD Signature Date/Time: 01/30/2020/3:37:01 PM    Final       Subjective: Subjective  Patient interviewed and examined along with spouse at bedside.  Overall patient continues to feel better.  Patient wants to shower.  No acute issues or pain reported.  As per spouse, mental status changes have resolved and close to baseline.  He stated that he will be back from work and able to pick her up for discharge later this evening.  Both were comfortable with her going home today.  Discharge Exam:  Vitals:   02/05/20 0339 02/05/20 0717 02/05/20 1137 02/05/20 1634  BP: (!) 133/58 (!) 150/61 129/70 (!) 143/70  Pulse: 91 99 89 (!) 104  Resp: 19 16 19 20   Temp: 98.6 F (37 C) 99.1 F (37.3 C) 98 F (36.7 C) 97.7 F (36.5 C)  TempSrc: Oral Oral Oral Oral  SpO2: 98% 98% 100% (!) 57%  Weight:      Height:        Pleasant middle-aged female, moderately built and nourished sitting up comfortably in reclining chair this morning. Awake Alert, Oriented X person, place and partly to time. No focal neurological deficits. [place].AT, Normal respiratory effort on , CTAB S1 and S2 heard, RRR, no JVD or pedal edema.  SEM heard, no edema, telemetry personally reviewed: Sinus rhythm with BBB morphology. +ve B.Sounds, Abd Soft, No tenderness, No  rebound, guarding or rigidity. Skin intact except mild unroofing of skin on coccyx area with mepilex dressing in place that is clean, dry, and intact (as examined by prior hospitalist MD).  Following your pictures taken early on in the admission.  Right Leg          The results of significant diagnostics from this hospitalization (including imaging, microbiology, ancillary and laboratory) are listed below for reference.      Microbiology: Recent Results (from the past 240 hour(s))  CSF culture     Status: None   Collection Time: 01/26/20  5:37 PM   Specimen: PATH Cytology CSF; Cerebrospinal Fluid  Result Value Ref Range Status   Specimen Description CSF  Final   Special Requests NONE  Final   Gram Stain   Final    WBC PRESENT, PREDOMINANTLY PMN NO ORGANISMS SEEN CYTOSPIN SMEAR    Culture   Final    NO GROWTH 3 DAYS Performed at [facility], [address], [place], [place] [address]    Report Status 01/30/2020 FINAL  Final  Culture, fungus without smear     Status: None (Preliminary result)   Collection Time: 01/26/20  5:37 PM   Specimen: CSF  Result Value Ref Range Status   Specimen Description CSF  Final   Special Requests NONE  Final   Culture   Final    NO FUNGUS ISOLATED AFTER 9 DAYS Performed at [facility], [place] [address], [place], [place] [address]    Report Status PENDING  Incomplete  Culture, blood (Routine X 2) w Reflex to ID Panel     Status: None   Collection Time: 01/29/20  3:11 PM   Specimen: BLOOD  Result Value Ref Range Status   Specimen Description BLOOD RIGHT ANTECUBITAL  Final   Special Requests   Final    BOTTLES DRAWN AEROBIC AND ANAEROBIC Blood Culture adequate volume   Culture   Final    NO GROWTH 5 DAYS Performed at [facility], [place] [address], [place], [place] [address]    Report Status 02/03/2020 FINAL  Final  Culture, blood (Routine X 2) w Reflex to ID Panel     Status: None   Collection Time: 01/29/20  3:19 PM   Specimen: BLOOD RIGHT HAND  Result Value Ref Range Status   Specimen Description BLOOD RIGHT HAND  Final   Special Requests IN PEDIATRIC BOTTLE Blood Culture adequate volume  Final   Culture   Final    NO GROWTH 5 DAYS Performed at [facility], [place] [address] [place], [place] [address]    Report Status 02/03/2020 FINAL  Final     Labs: CBC: Recent Labs  Lab 01/31/20 0509 02/01/20 0515 02/02/20 0438  02/03/20 0332 02/05/20 0426  WBC 28.1* 24.8* 24.5* 20.2* 16.0*  HGB 10.9* 10.4* 10.5* 10.1* 9.9*  HCT 32.8* 31.6* 31.5* 30.1* 30.3*  MCV 81.6 81.4 81.2 82.2 82.8  PLT 280 340 434* 474* 594*    Basic Metabolic Panel: Recent Labs  Lab 01/30/20 0554 01/31/20 0509 02/01/20 0515 02/01/20 2220 02/02/20 0438 02/05/20 0426  NA 144 141 139  --  137 143  K 4.1 3.7 3.6  --  4.2 3.9  CL 115* 110 108  --  106 107  CO2 18* 21* 21*  --  22 27  GLUCOSE 98 117* 92  --  109* 97  BUN 37* 22 16  --  16 12  CREATININE 1.12* 0.81  0.80  --  0.65 0.62  CALCIUM 8.3* 8.1* 8.1*  --  8.2* 8.9  MG  --   --   --  1.7 1.7  --     Liver Function Tests: No results for input(s): AST, ALT, ALKPHOS, BILITOT, PROT, ALBUMIN in the last 168 hours.  CBG: Recent Labs  Lab 02/04/20 1204 02/04/20 1708 02/05/20 0036 02/05/20 0555 02/05/20 1129  GLUCAP 96 96 84 94 96    Urinalysis    Component Value Date/Time   COLORURINE YELLOW 01/29/2020 1815   APPEARANCEUR CLEAR 01/29/2020 1815   LABSPEC 1.012 01/29/2020 1815   PHURINE 6.0 01/29/2020 1815   GLUCOSEU 50 (A) 01/29/2020 1815   HGBUR MODERATE (A) 01/29/2020 1815   BILIRUBINUR NEGATIVE 01/29/2020 1815   KETONESUR NEGATIVE 01/29/2020 1815   PROTEINUR NEGATIVE 01/29/2020 1815   NITRITE NEGATIVE 01/29/2020 1815   LEUKOCYTESUR NEGATIVE 01/29/2020 1815    I discussed in detail with patient spouse at bedside this morning, updated care and answered questions.  Time coordinating discharge: 50 minutes  SIGNED:  Vernell Leep, MD, [place], [facility]. [facility]  To contact the attending provider between 7A-7P or the covering provider during after hours 7P-7A, please log into the web site [email or web address] and access using universal [place] password for that web site. If you do not have the password, please call the hospital operator.

## 2020-02-05 NOTE — TOC Transition Note (Signed)
Transition of Care [facility]) - CM/SW Discharge Note   Patient Details  Name: Kathleen Duran MRN: [id number] Date of Birth: May 06, 1951  [facility] CM/SW Contact:  Geralynn Ochs, LCSW Phone Number: 02/05/2020, 4:13 PM   Clinical Narrative:   Patient going home with home health, patient said she had no preference for provider but her husband might. CSW spoke with husband, said he thinks he's used [facility] before. CSW spoke with [facility], they are unable to accept referral; CSW provided to [facility] who accepted patient. Husband asked for shower stool, and patient in agreement. CSW gave referral to [facility] to bring to the bedside. No other needs at this time, patient will be picked up by her husband after he's done work today.    Final next level of care: Skilled Nursing Facility Barriers to Discharge: Barriers Resolved   Patient Goals and CMS Choice Patient states their goals for this hospitalization and ongoing recovery are:: to get back home   Choice offered to / list presented to : Patient  Discharge Placement                Patient to be transferred to facility by: Family car Name of family member notified: Herbie [place] Patient and family notified of of transfer: 02/05/20  Discharge Plan and Services                DME Arranged: Shower stool DME Agency: [facility] Date DME Agency Contacted: 02/05/20   Representative spoke with at DME Agency: [place]: PT, OT, Speech Therapy [place] Agency: [facility] ([facility] [employer] Date [place]: 02/05/20   Representative spoke with at [place]: [place] ([place]) Interventions     Readmission Risk Interventions No flowsheet data found.

## 2020-02-05 NOTE — Progress Notes (Signed)
Nutrition Follow-up  DOCUMENTATION CODES:   Obesity unspecified  INTERVENTION:  Continue MVI Daily  Continue Ensure Enlive po TID, each supplement provides 350 kcal and 20 grams of protein  Continue Magic cup TID with meals, each supplement provides 290 kcal and 9 grams of protein  NUTRITION DIAGNOSIS:   Inadequate oral intake related to poor appetite as evidenced by meal completion < 50%.  Ongoing.  GOAL:   Patient will meet greater than or equal to 90% of their needs  Progressing.  MONITOR:   PO intake, Supplement acceptance, Weight trends, Labs  REASON FOR ASSESSMENT:   Malnutrition Screening Tool    ASSESSMENT:   Pt with a PMH significant for leukemia in remission, non-Hodgkin's lymphoma and hiatal hernia who presented to the ED with altered mental status, fever significantly elevated leukocytosis and rightward gaze preference on admission.  She is noted to have neck stiffness and imaging suggestive of meningitis  3/29 - s/p lumbar puncture, findings consistent with bacterial meningitis 3/30 - pt failed BSE 3/31 - DYS2/Thins diet ordered 4/2 - regular diet/thins ordered  Pt reports poor appetite and that she is getting tired of the Ensure shakes. Discussed use of other protein shakes at home to increase protein/calorie intake.   PO Intake: 0-75% x last 8 recorded meals (23% average intake)  Labs reviewed. Medications reviewed and include: Ensure Enlive BID, MVI  Diet Order:   Diet Order            Diet regular Room service appropriate? Yes with Assist; Fluid consistency: Thin  Diet effective now              EDUCATION NEEDS:   Not appropriate for education at this time  Skin:  Skin Assessment: Skin Integrity Issues: Skin Integrity Issues:: Incisions, Other (Comment) Incisions: back Other: pressure injury sacrum  Last BM:  4/7  Height:   Ht Readings from Last 1 Encounters:  01/25/20 5' 2.64" (1.591 m)    Weight:   Wt Readings from  Last 1 Encounters:  02/02/20 84.6 kg    BMI:  Body mass index is 33.42 kg/m.  Estimated Nutritional Needs:   Kcal:  1500-1700  Protein:  90-105 grams  Fluid:  >1.5 L   Larkin Ina, MS, RD, LDN RD pager number and weekend/on-call pager number located in [place].

## 2020-02-16 LAB — CULTURE, FUNGUS WITHOUT SMEAR

## 2020-02-23 ENCOUNTER — Ambulatory Visit: Payer: Medicare HMO | Admitting: Internal Medicine

## 2020-03-02 ENCOUNTER — Ambulatory Visit: Payer: Medicare HMO | Admitting: Cardiovascular Disease

## 2020-06-30 DEATH — deceased

## 2020-09-28 IMAGING — DX DG CHEST 1V PORT
1 series · 1 of 1 positions shown · non-contrast
Comparison: 01/27/2020

CLINICAL DATA: Shortness of breath. History of leukemia.

EXAM:
PORTABLE CHEST 1 VIEW

[chest]
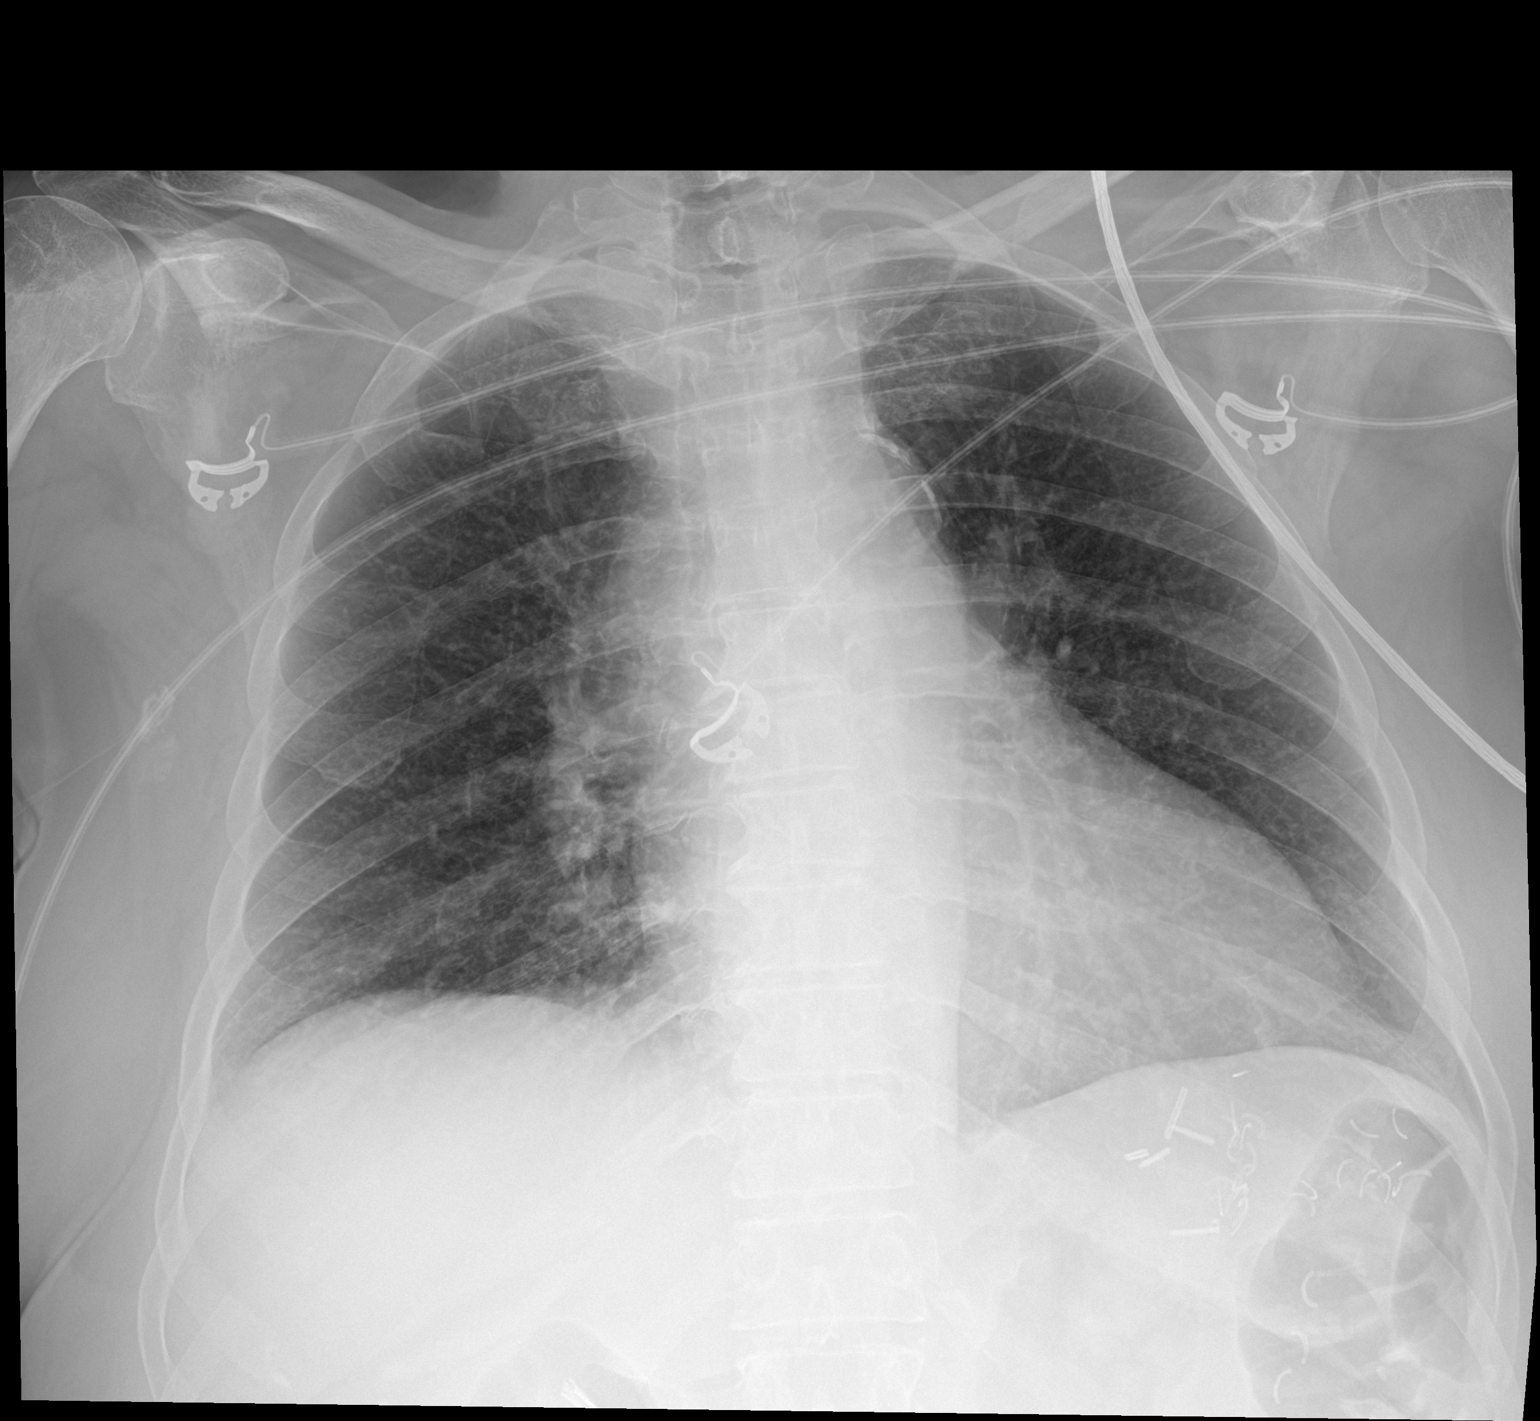

[1 of 1 positions shown; findings below may reference images not displayed]

FINDINGS: Midline trachea. Borderline cardiomegaly. Atherosclerosis in the
transverse aorta. No pleural effusion or pneumothorax. Surgical
clips in the left upper quadrant. No congestive failure. No lobar
consolidation.
IMPRESSION: Borderline cardiomegaly, without acute disease.

Aortic Atherosclerosis (XIACC-2D8.8).

## 2020-10-04 IMAGING — CR DG SHOULDER 2+V*L*
3 series · 3 of 3 positions shown · non-contrast
Comparison: December 19, 2018.

CLINICAL DATA: Left shoulder pain.

EXAM:
LEFT SHOULDER - 2+ VIEW

[shoulder grashey]
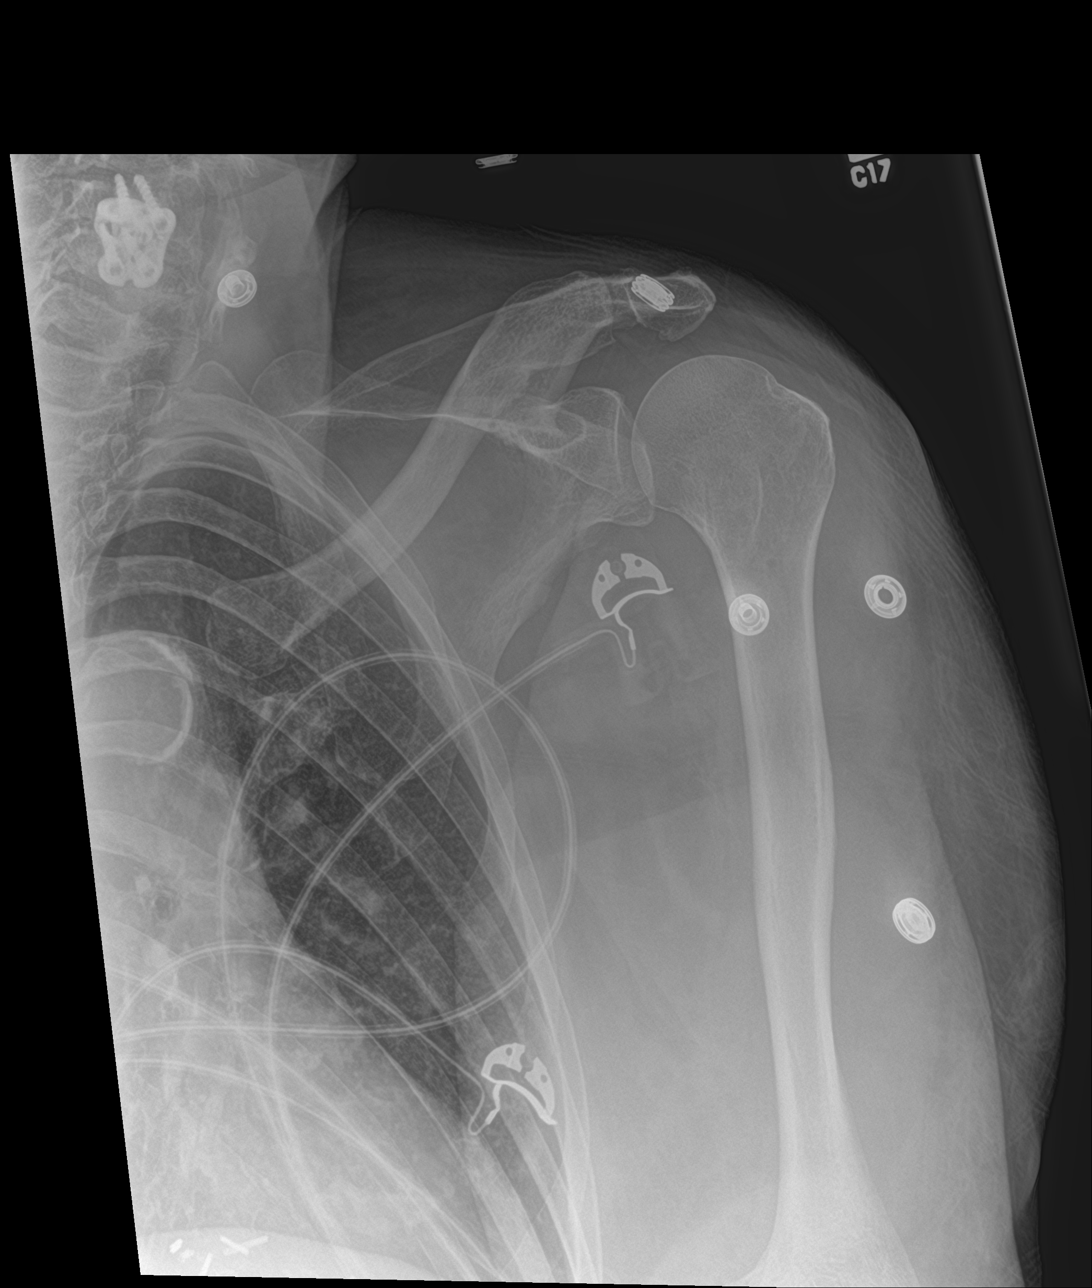

[shoulder ap neutral]
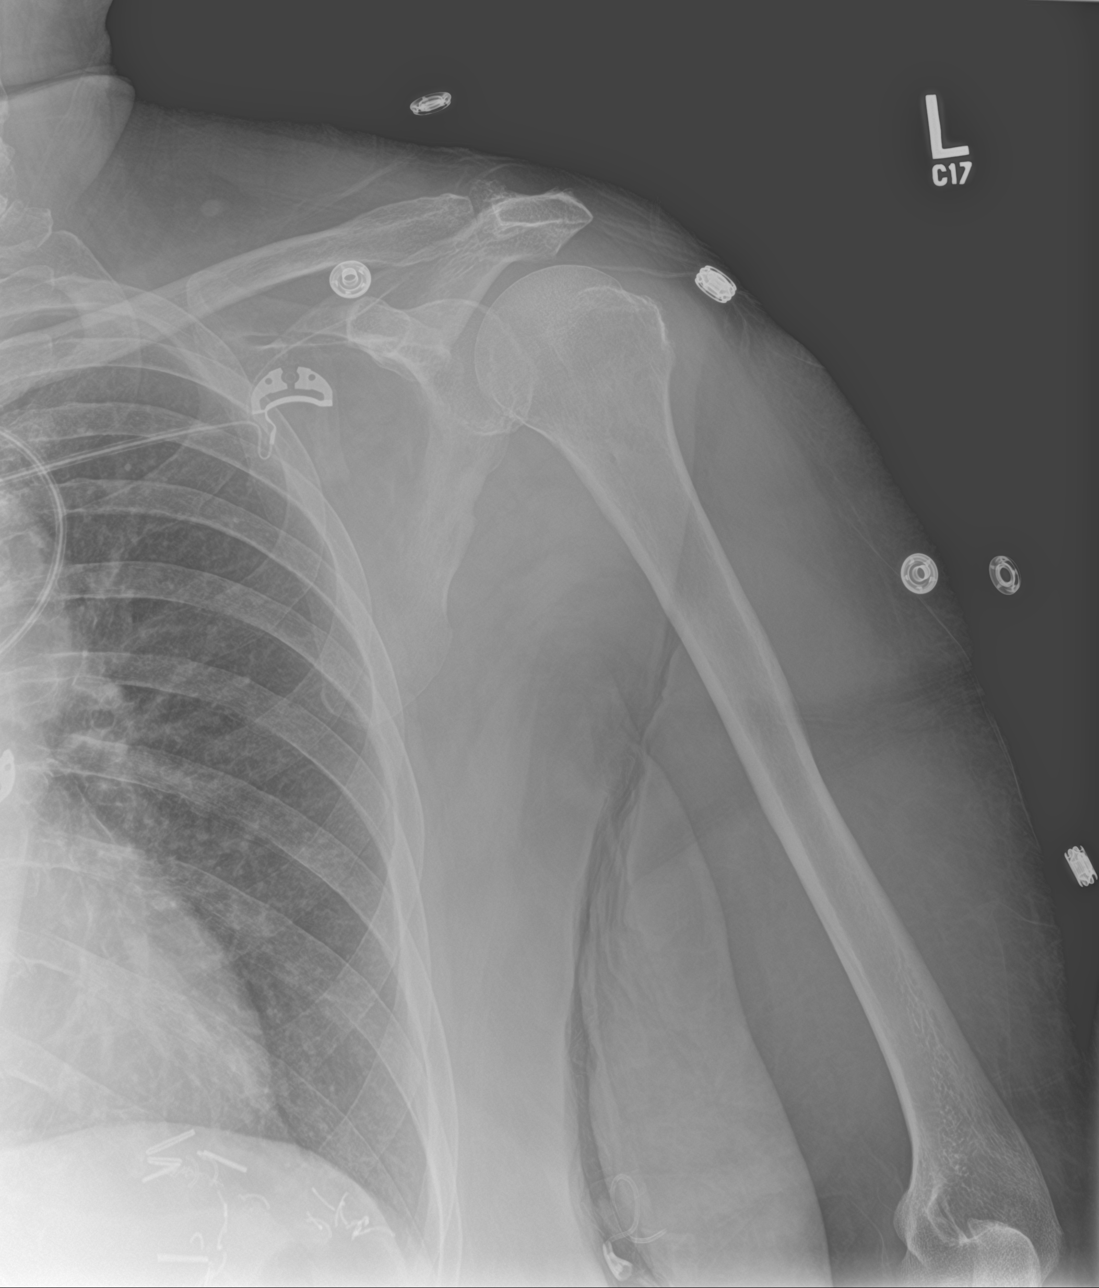

[shoulder y view]
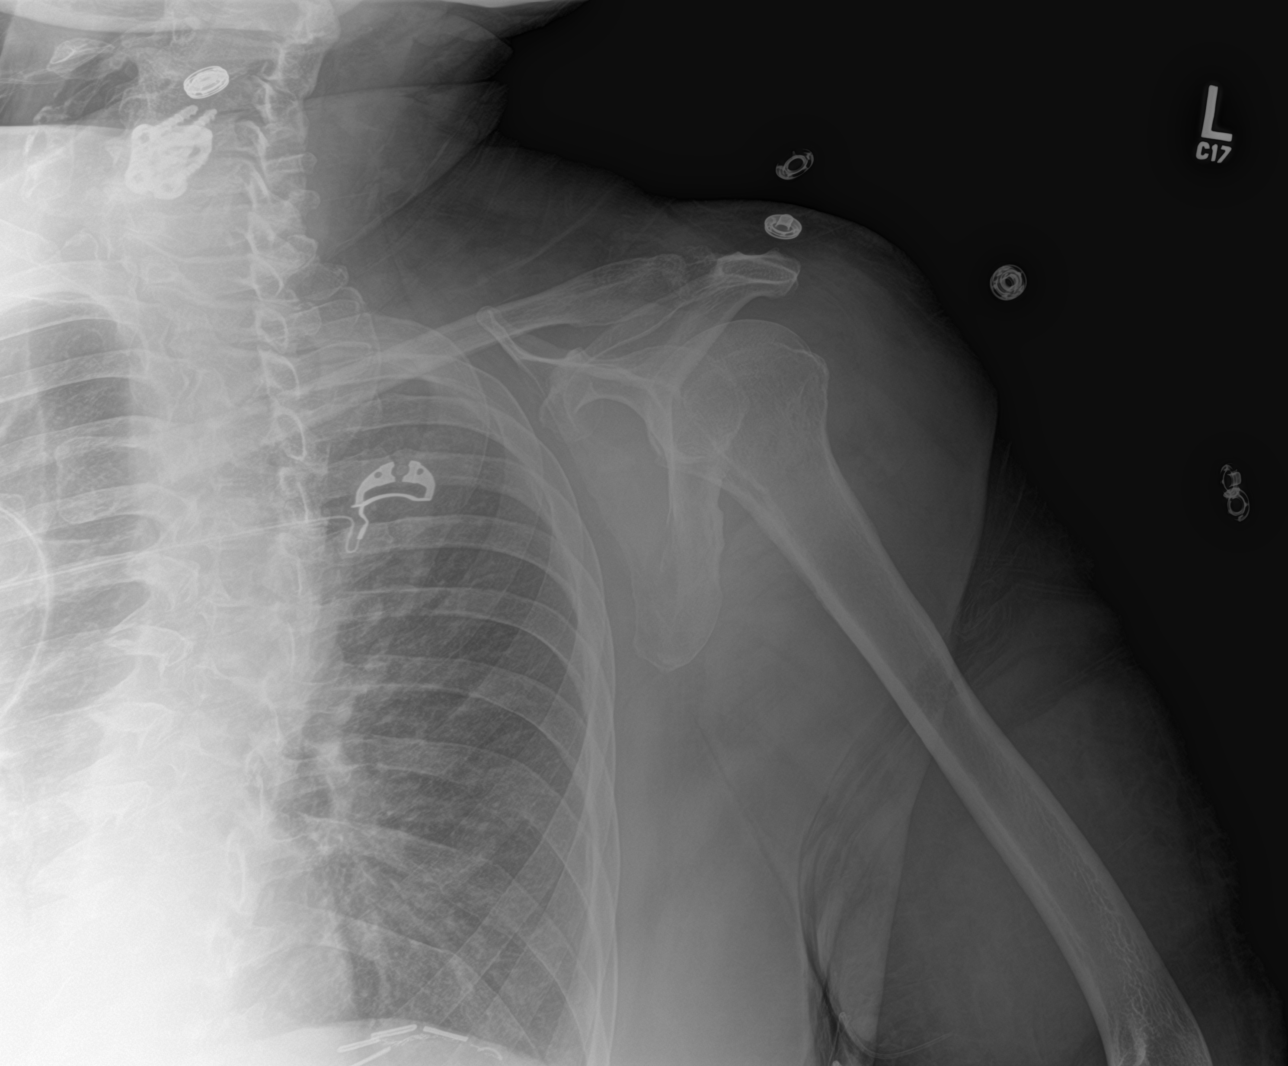

[3 of 3 positions shown; findings below may reference images not displayed]

FINDINGS: There is no evidence of fracture or dislocation. There is no
evidence of arthropathy or other focal bone abnormality. Soft
tissues are unremarkable.
IMPRESSION: Negative.
